# Patient Record
Sex: Male | Born: 1945
Health system: Southern US, Community
[De-identification: ages and names within clinical notes are randomized; demographics above are authoritative.]

## PROBLEM LIST (undated history)

## (undated) DIAGNOSIS — E785 Hyperlipidemia, unspecified: Secondary | ICD-10-CM

## (undated) DIAGNOSIS — M545 Low back pain, unspecified: Secondary | ICD-10-CM

## (undated) DIAGNOSIS — E119 Type 2 diabetes mellitus without complications: Secondary | ICD-10-CM

## (undated) DIAGNOSIS — G8929 Other chronic pain: Secondary | ICD-10-CM

## (undated) DIAGNOSIS — M5136 Other intervertebral disc degeneration, lumbar region: Secondary | ICD-10-CM

## (undated) DIAGNOSIS — Z8739 Personal history of other diseases of the musculoskeletal system and connective tissue: Secondary | ICD-10-CM

## (undated) DIAGNOSIS — I10 Essential (primary) hypertension: Secondary | ICD-10-CM

## (undated) DIAGNOSIS — M51369 Other intervertebral disc degeneration, lumbar region without mention of lumbar back pain or lower extremity pain: Secondary | ICD-10-CM

## (undated) DIAGNOSIS — C61 Malignant neoplasm of prostate: Secondary | ICD-10-CM

## (undated) DIAGNOSIS — M5126 Other intervertebral disc displacement, lumbar region: Secondary | ICD-10-CM

## (undated) HISTORY — DX: Hyperlipidemia, unspecified: E78.5

## (undated) HISTORY — PX: VASECTOMY: SHX75

## (undated) HISTORY — DX: Type 2 diabetes mellitus without complications: E11.9

## (undated) HISTORY — DX: Essential (primary) hypertension: I10

## (undated) HISTORY — PX: PROSTATE BIOPSY: SHX241

## (undated) HISTORY — DX: Malignant neoplasm of prostate: C61

## (undated) HISTORY — PX: COLONOSCOPY: SHX174

---

## 1998-12-01 ENCOUNTER — Emergency Department (HOSPITAL_COMMUNITY): Admission: EM | Admit: 1998-12-01 | Discharge: 1998-12-01 | Payer: Self-pay | Admitting: Emergency Medicine

## 1998-12-01 ENCOUNTER — Encounter: Payer: Self-pay | Admitting: Emergency Medicine

## 1998-12-05 ENCOUNTER — Ambulatory Visit (HOSPITAL_COMMUNITY): Admission: RE | Admit: 1998-12-05 | Discharge: 1998-12-05 | Payer: Self-pay | Admitting: Internal Medicine

## 1998-12-05 ENCOUNTER — Encounter: Payer: Self-pay | Admitting: Internal Medicine

## 2001-10-23 ENCOUNTER — Emergency Department (HOSPITAL_COMMUNITY): Admission: EM | Admit: 2001-10-23 | Discharge: 2001-10-23 | Payer: Self-pay | Admitting: Emergency Medicine

## 2003-05-30 ENCOUNTER — Emergency Department (HOSPITAL_COMMUNITY): Admission: EM | Admit: 2003-05-30 | Discharge: 2003-05-30 | Payer: Self-pay

## 2004-10-29 ENCOUNTER — Ambulatory Visit: Payer: Self-pay | Admitting: Endocrinology

## 2004-10-30 ENCOUNTER — Ambulatory Visit: Payer: Self-pay

## 2004-11-12 ENCOUNTER — Ambulatory Visit: Payer: Self-pay | Admitting: Internal Medicine

## 2005-01-19 ENCOUNTER — Ambulatory Visit: Payer: Self-pay | Admitting: Internal Medicine

## 2005-01-26 ENCOUNTER — Ambulatory Visit: Payer: Self-pay | Admitting: Internal Medicine

## 2005-02-09 ENCOUNTER — Encounter: Admission: RE | Admit: 2005-02-09 | Discharge: 2005-02-26 | Payer: Self-pay | Admitting: Internal Medicine

## 2005-03-02 ENCOUNTER — Ambulatory Visit: Payer: Self-pay | Admitting: Internal Medicine

## 2005-05-04 ENCOUNTER — Ambulatory Visit: Payer: Self-pay | Admitting: Internal Medicine

## 2005-06-01 ENCOUNTER — Encounter: Admission: RE | Admit: 2005-06-01 | Discharge: 2005-08-30 | Payer: Self-pay | Admitting: Internal Medicine

## 2005-10-02 ENCOUNTER — Emergency Department (HOSPITAL_COMMUNITY): Admission: EM | Admit: 2005-10-02 | Discharge: 2005-10-02 | Payer: Self-pay | Admitting: Emergency Medicine

## 2005-11-23 ENCOUNTER — Ambulatory Visit: Payer: Self-pay | Admitting: Internal Medicine

## 2006-02-17 ENCOUNTER — Ambulatory Visit: Payer: Self-pay | Admitting: Internal Medicine

## 2006-02-17 LAB — CONVERTED CEMR LAB
ALT: 25 units/L (ref 0–40)
AST: 23 units/L (ref 0–37)
Albumin: 3.8 g/dL (ref 3.5–5.2)
Alkaline Phosphatase: 65 units/L (ref 39–117)
BUN: 13 mg/dL (ref 6–23)
CO2: 28 meq/L (ref 19–32)
Calcium: 9.5 mg/dL (ref 8.4–10.5)
Chloride: 106 meq/L (ref 96–112)
Chol/HDL Ratio, serum: 5.3
Cholesterol: 190 mg/dL (ref 0–200)
Creatinine, Ser: 0.9 mg/dL (ref 0.4–1.5)
GFR calc non Af Amer: 92 mL/min
Glomerular Filtration Rate, Af Am: 111 mL/min/{1.73_m2}
Glucose, Bld: 116 mg/dL — ABNORMAL HIGH (ref 70–99)
HDL: 35.6 mg/dL — ABNORMAL LOW (ref >39.0)
Hgb A1c MFr Bld: 6.2 % — ABNORMAL HIGH (ref 4.6–6.0)
LDL Cholesterol: 132 mg/dL — ABNORMAL HIGH (ref 0–99)
Potassium: 4.6 meq/L (ref 3.5–5.1)
Sodium: 140 meq/L (ref 135–145)
Total Bilirubin: 0.7 mg/dL (ref 0.3–1.2)
Total Protein: 6.9 g/dL (ref 6.0–8.3)
Triglyceride fasting, serum: 113 mg/dL (ref 0–149)
VLDL: 23 mg/dL (ref 0–40)

## 2006-02-23 ENCOUNTER — Ambulatory Visit: Payer: Self-pay | Admitting: Internal Medicine

## 2006-06-07 ENCOUNTER — Ambulatory Visit: Payer: Self-pay | Admitting: Internal Medicine

## 2006-08-23 ENCOUNTER — Ambulatory Visit: Payer: Self-pay | Admitting: Internal Medicine

## 2006-08-23 LAB — CONVERTED CEMR LAB
ALT: 24 units/L (ref 0–40)
AST: 22 units/L (ref 0–37)
Albumin: 3.7 g/dL (ref 3.5–5.2)
Alkaline Phosphatase: 68 units/L (ref 39–117)
BUN: 17 mg/dL (ref 6–23)
Basophils Absolute: 0.1 10*3/uL (ref 0.0–0.1)
Basophils Relative: 0.9 % (ref 0.0–1.0)
Bilirubin, Direct: 0.1 mg/dL (ref 0.0–0.3)
CO2: 28 meq/L (ref 19–32)
Calcium: 8.9 mg/dL (ref 8.4–10.5)
Chloride: 109 meq/L (ref 96–112)
Cholesterol: 127 mg/dL (ref 0–200)
Creatinine, Ser: 0.9 mg/dL (ref 0.4–1.5)
Eosinophils Absolute: 0.1 10*3/uL (ref 0.0–0.6)
Eosinophils Relative: 1.3 % (ref 0.0–5.0)
GFR calc Af Amer: 111 mL/min
GFR calc non Af Amer: 92 mL/min
Glucose, Bld: 110 mg/dL — ABNORMAL HIGH (ref 70–99)
HCT: 43.1 % (ref 39.0–52.0)
HDL: 33.9 mg/dL — ABNORMAL LOW (ref >39.0)
Hemoglobin: 14.9 g/dL (ref 13.0–17.0)
Hgb A1c MFr Bld: 6.3 % — ABNORMAL HIGH (ref 4.6–6.0)
LDL Cholesterol: 80 mg/dL (ref 0–99)
Lymphocytes Relative: 25.8 % (ref 12.0–46.0)
MCHC: 34.7 g/dL (ref 30.0–36.0)
MCV: 91.6 fL (ref 78.0–100.0)
Monocytes Absolute: 0.6 10*3/uL (ref 0.2–0.7)
Monocytes Relative: 10.2 % (ref 3.0–11.0)
Neutro Abs: 3.4 10*3/uL (ref 1.4–7.7)
Neutrophils Relative %: 61.8 % (ref 43.0–77.0)
Platelets: 203 10*3/uL (ref 150–400)
Potassium: 4.5 meq/L (ref 3.5–5.1)
RBC: 4.7 M/uL (ref 4.22–5.81)
RDW: 12.5 % (ref 11.5–14.6)
Sodium: 142 meq/L (ref 135–145)
TSH: 1.35 microintl units/mL (ref 0.35–5.50)
Total Bilirubin: 0.6 mg/dL (ref 0.3–1.2)
Total CHOL/HDL Ratio: 3.7
Total Protein: 6.6 g/dL (ref 6.0–8.3)
Triglycerides: 67 mg/dL (ref 0–149)
VLDL: 13 mg/dL (ref 0–40)
WBC: 5.7 10*3/uL (ref 4.5–10.5)

## 2006-08-27 ENCOUNTER — Ambulatory Visit: Payer: Self-pay | Admitting: Internal Medicine

## 2006-12-16 ENCOUNTER — Ambulatory Visit: Payer: Self-pay | Admitting: Internal Medicine

## 2006-12-16 LAB — CONVERTED CEMR LAB
AST: 24 units/L (ref 0–37)
BUN: 18 mg/dL (ref 6–23)
CO2: 30 meq/L (ref 19–32)
Calcium: 9.1 mg/dL (ref 8.4–10.5)
Chloride: 112 meq/L (ref 96–112)
Cholesterol: 155 mg/dL (ref 0–200)
Creatinine, Ser: 0.9 mg/dL (ref 0.4–1.5)
GFR calc Af Amer: 111 mL/min
GFR calc non Af Amer: 92 mL/min
Glucose, Bld: 124 mg/dL — ABNORMAL HIGH (ref 70–99)
HDL: 37.2 mg/dL — ABNORMAL LOW (ref >39.0)
Hgb A1c MFr Bld: 6.4 % — ABNORMAL HIGH (ref 4.6–6.0)
LDL Cholesterol: 100 mg/dL — ABNORMAL HIGH (ref 0–99)
Potassium: 4.9 meq/L (ref 3.5–5.1)
Sodium: 148 meq/L — ABNORMAL HIGH (ref 135–145)
Total CHOL/HDL Ratio: 4.2
Triglycerides: 88 mg/dL (ref 0–149)
Uric Acid, Serum: 7.1 mg/dL — ABNORMAL HIGH (ref 2.4–7.0)
VLDL: 18 mg/dL (ref 0–40)
Vit D, 1,25-Dihydroxy: 17 — ABNORMAL LOW (ref 20–57)

## 2006-12-29 ENCOUNTER — Ambulatory Visit: Payer: Self-pay | Admitting: Internal Medicine

## 2007-04-25 ENCOUNTER — Ambulatory Visit: Payer: Self-pay | Admitting: Internal Medicine

## 2007-04-25 DIAGNOSIS — E119 Type 2 diabetes mellitus without complications: Secondary | ICD-10-CM

## 2007-04-25 DIAGNOSIS — E7800 Pure hypercholesterolemia, unspecified: Secondary | ICD-10-CM

## 2007-04-25 LAB — CONVERTED CEMR LAB
BUN: 14 mg/dL (ref 6–23)
CO2: 29 meq/L (ref 19–32)
Calcium: 9.4 mg/dL (ref 8.4–10.5)
Chloride: 107 meq/L (ref 96–112)
Creatinine, Ser: 0.9 mg/dL (ref 0.4–1.5)
GFR calc Af Amer: 111 mL/min
GFR calc non Af Amer: 92 mL/min
Glucose, Bld: 125 mg/dL — ABNORMAL HIGH (ref 70–99)
Hgb A1c MFr Bld: 6.5 % — ABNORMAL HIGH (ref 4.6–6.0)
Potassium: 4.6 meq/L (ref 3.5–5.1)
Sodium: 142 meq/L (ref 135–145)

## 2007-05-02 ENCOUNTER — Ambulatory Visit: Payer: Self-pay | Admitting: Internal Medicine

## 2007-05-02 DIAGNOSIS — M109 Gout, unspecified: Secondary | ICD-10-CM | POA: Insufficient documentation

## 2007-05-02 DIAGNOSIS — R209 Unspecified disturbances of skin sensation: Secondary | ICD-10-CM | POA: Insufficient documentation

## 2007-05-02 DIAGNOSIS — I10 Essential (primary) hypertension: Secondary | ICD-10-CM | POA: Insufficient documentation

## 2007-05-02 DIAGNOSIS — E785 Hyperlipidemia, unspecified: Secondary | ICD-10-CM

## 2007-08-29 ENCOUNTER — Encounter: Payer: Self-pay | Admitting: Internal Medicine

## 2007-09-01 ENCOUNTER — Encounter: Payer: Self-pay | Admitting: Internal Medicine

## 2007-09-05 ENCOUNTER — Ambulatory Visit: Payer: Self-pay | Admitting: Internal Medicine

## 2007-09-05 LAB — CONVERTED CEMR LAB
CO2: 29 meq/L (ref 19–32)
Calcium: 8.8 mg/dL (ref 8.4–10.5)
Creatinine, Ser: 0.9 mg/dL (ref 0.4–1.5)
GFR calc Af Amer: 111 mL/min
GFR calc non Af Amer: 92 mL/min
HDL: 32.7 mg/dL — ABNORMAL LOW (ref >39.0)
Hgb A1c MFr Bld: 6.7 % — ABNORMAL HIGH (ref 4.6–6.0)
LDL Cholesterol: 89 mg/dL (ref 0–99)
Sodium: 143 meq/L (ref 135–145)
Total CHOL/HDL Ratio: 4.3
Triglycerides: 103 mg/dL (ref 0–149)
VLDL: 21 mg/dL (ref 0–40)
Vitamin B-12: 338 pg/mL (ref 211–911)

## 2007-09-09 ENCOUNTER — Ambulatory Visit: Payer: Self-pay | Admitting: Internal Medicine

## 2007-09-09 DIAGNOSIS — M1990 Unspecified osteoarthritis, unspecified site: Secondary | ICD-10-CM | POA: Insufficient documentation

## 2007-09-09 DIAGNOSIS — M545 Low back pain: Secondary | ICD-10-CM | POA: Insufficient documentation

## 2007-09-19 ENCOUNTER — Encounter: Payer: Self-pay | Admitting: Internal Medicine

## 2007-11-30 ENCOUNTER — Encounter: Payer: Self-pay | Admitting: Internal Medicine

## 2007-12-19 ENCOUNTER — Ambulatory Visit: Payer: Self-pay | Admitting: Internal Medicine

## 2007-12-19 LAB — CONVERTED CEMR LAB
BUN: 17 mg/dL (ref 6–23)
Calcium: 8.8 mg/dL (ref 8.4–10.5)
Chloride: 110 meq/L (ref 96–112)
Creatinine, Ser: 0.9 mg/dL (ref 0.4–1.5)
GFR calc non Af Amer: 92 mL/min

## 2007-12-23 ENCOUNTER — Ambulatory Visit: Payer: Self-pay | Admitting: Internal Medicine

## 2008-04-23 ENCOUNTER — Ambulatory Visit: Payer: Self-pay | Admitting: Internal Medicine

## 2008-04-23 LAB — CONVERTED CEMR LAB
BUN: 15 mg/dL (ref 6–23)
Calcium: 9.3 mg/dL (ref 8.4–10.5)
Chloride: 108 meq/L (ref 96–112)
Creatinine, Ser: 0.9 mg/dL (ref 0.4–1.5)
GFR calc non Af Amer: 91 mL/min

## 2008-05-02 ENCOUNTER — Ambulatory Visit: Payer: Self-pay | Admitting: Internal Medicine

## 2008-05-02 DIAGNOSIS — R079 Chest pain, unspecified: Secondary | ICD-10-CM

## 2008-08-27 ENCOUNTER — Ambulatory Visit: Payer: Self-pay | Admitting: Internal Medicine

## 2008-08-27 LAB — CONVERTED CEMR LAB
ALT: 30 units/L (ref 0–53)
AST: 27 units/L (ref 0–37)
Albumin: 3.8 g/dL (ref 3.5–5.2)
Alkaline Phosphatase: 64 units/L (ref 39–117)
BUN: 19 mg/dL (ref 6–23)
Basophils Relative: 0.3 % (ref 0.0–3.0)
Bilirubin Urine: NEGATIVE
CO2: 29 meq/L (ref 19–32)
Calcium: 9.2 mg/dL (ref 8.4–10.5)
Chloride: 111 meq/L (ref 96–112)
Cholesterol: 157 mg/dL (ref 0–200)
Creatinine, Ser: 0.9 mg/dL (ref 0.4–1.5)
Eosinophils Relative: 2 % (ref 0.0–5.0)
Hemoglobin: 15.7 g/dL (ref 13.0–17.0)
Ketones, ur: NEGATIVE mg/dL
LDL Cholesterol: 103 mg/dL — ABNORMAL HIGH (ref 0–99)
Lymphocytes Relative: 24.7 % (ref 12.0–46.0)
Monocytes Relative: 13.1 % — ABNORMAL HIGH (ref 3.0–12.0)
Neutro Abs: 3.5 10*3/uL (ref 1.4–7.7)
Nitrite: NEGATIVE
RBC: 4.92 M/uL (ref 4.22–5.81)
Total CHOL/HDL Ratio: 4
Total Protein, Urine: NEGATIVE mg/dL
Total Protein: 6.7 g/dL (ref 6.0–8.3)
pH: 5.5 (ref 5.0–8.0)

## 2008-09-03 ENCOUNTER — Ambulatory Visit: Payer: Self-pay | Admitting: Internal Medicine

## 2008-09-24 ENCOUNTER — Telehealth: Payer: Self-pay | Admitting: Internal Medicine

## 2008-12-17 ENCOUNTER — Ambulatory Visit: Payer: Self-pay | Admitting: Internal Medicine

## 2008-12-17 LAB — CONVERTED CEMR LAB
CO2: 27 meq/L (ref 19–32)
Chloride: 110 meq/L (ref 96–112)
Creatinine, Ser: 0.9 mg/dL (ref 0.4–1.5)

## 2008-12-21 ENCOUNTER — Ambulatory Visit: Payer: Self-pay | Admitting: Internal Medicine

## 2009-04-29 ENCOUNTER — Ambulatory Visit: Payer: Self-pay | Admitting: Internal Medicine

## 2009-04-29 LAB — CONVERTED CEMR LAB
CO2: 27 meq/L (ref 19–32)
Calcium: 9.1 mg/dL (ref 8.4–10.5)
Glucose, Bld: 131 mg/dL — ABNORMAL HIGH (ref 70–99)
Sodium: 142 meq/L (ref 135–145)

## 2009-05-06 ENCOUNTER — Ambulatory Visit: Payer: Self-pay | Admitting: Internal Medicine

## 2009-08-27 ENCOUNTER — Ambulatory Visit: Payer: Self-pay | Admitting: Internal Medicine

## 2009-08-27 LAB — CONVERTED CEMR LAB
CO2: 28 meq/L (ref 19–32)
Calcium: 9.4 mg/dL (ref 8.4–10.5)
Chloride: 107 meq/L (ref 96–112)
Creatinine, Ser: 0.9 mg/dL (ref 0.4–1.5)
Glucose, Bld: 127 mg/dL — ABNORMAL HIGH (ref 70–99)
Hgb A1c MFr Bld: 6.9 % — ABNORMAL HIGH (ref 4.6–6.5)
Potassium: 4.6 meq/L (ref 3.5–5.1)

## 2009-09-03 ENCOUNTER — Ambulatory Visit: Payer: Self-pay | Admitting: Internal Medicine

## 2009-09-03 DIAGNOSIS — M79609 Pain in unspecified limb: Secondary | ICD-10-CM

## 2009-11-08 ENCOUNTER — Encounter: Payer: Self-pay | Admitting: Internal Medicine

## 2009-11-08 ENCOUNTER — Emergency Department (HOSPITAL_COMMUNITY): Admission: EM | Admit: 2009-11-08 | Discharge: 2009-11-08 | Payer: Self-pay | Admitting: Emergency Medicine

## 2009-11-12 ENCOUNTER — Encounter: Payer: Self-pay | Admitting: Internal Medicine

## 2009-12-12 ENCOUNTER — Ambulatory Visit: Payer: Self-pay | Admitting: Internal Medicine

## 2009-12-12 LAB — CONVERTED CEMR LAB
ALT: 26 units/L (ref 0–53)
Bilirubin Urine: NEGATIVE
Calcium: 9.2 mg/dL (ref 8.4–10.5)
Cholesterol: 157 mg/dL (ref 0–200)
Eosinophils Absolute: 0.1 10*3/uL (ref 0.0–0.7)
Eosinophils Relative: 2.3 % (ref 0.0–5.0)
GFR calc non Af Amer: 123.53 mL/min (ref >60)
HCT: 45.2 % (ref 39.0–52.0)
HDL: 41 mg/dL (ref >39.00)
Hemoglobin, Urine: NEGATIVE
Leukocytes, UA: NEGATIVE
Lymphs Abs: 1.3 10*3/uL (ref 0.7–4.0)
MCHC: 34.2 g/dL (ref 30.0–36.0)
MCV: 93.4 fL (ref 78.0–100.0)
Monocytes Absolute: 0.7 10*3/uL (ref 0.1–1.0)
Neutrophils Relative %: 62.9 % (ref 43.0–77.0)
Nitrite: NEGATIVE
PSA: 3.76 ng/mL (ref 0.10–4.00)
Platelets: 189 10*3/uL (ref 150.0–400.0)
Potassium: 4.8 meq/L (ref 3.5–5.1)
RDW: 13.2 % (ref 11.5–14.6)
Sodium: 144 meq/L (ref 135–145)
TSH: 1.26 microintl units/mL (ref 0.35–5.50)
Total Bilirubin: 0.7 mg/dL (ref 0.3–1.2)
Triglycerides: 118 mg/dL (ref 0.0–149.0)
VLDL: 23.6 mg/dL (ref 0.0–40.0)
Vitamin B-12: 278 pg/mL (ref 211–911)
WBC: 5.9 10*3/uL (ref 4.5–10.5)
pH: 6 (ref 5.0–8.0)

## 2009-12-17 ENCOUNTER — Encounter: Payer: Self-pay | Admitting: Internal Medicine

## 2009-12-20 ENCOUNTER — Encounter: Payer: Self-pay | Admitting: Internal Medicine

## 2009-12-20 ENCOUNTER — Ambulatory Visit: Payer: Self-pay | Admitting: Internal Medicine

## 2009-12-20 LAB — HM DIABETES FOOT EXAM

## 2010-04-18 ENCOUNTER — Ambulatory Visit
Admission: RE | Admit: 2010-04-18 | Discharge: 2010-04-18 | Payer: Self-pay | Source: Home / Self Care | Attending: Internal Medicine | Admitting: Internal Medicine

## 2010-04-18 LAB — CONVERTED CEMR LAB
BUN: 14 mg/dL (ref 6–23)
CO2: 29 meq/L (ref 19–32)
Chloride: 106 meq/L (ref 96–112)
Creatinine, Ser: 0.9 mg/dL (ref 0.4–1.5)
Glucose, Bld: 123 mg/dL — ABNORMAL HIGH (ref 70–99)

## 2010-04-28 ENCOUNTER — Ambulatory Visit
Admission: RE | Admit: 2010-04-28 | Discharge: 2010-04-28 | Payer: Self-pay | Source: Home / Self Care | Attending: Internal Medicine | Admitting: Internal Medicine

## 2010-05-20 NOTE — Letter (Signed)
Summary: [facility]  [facility]   Imported By: Lester [place] 12/26/2009 09:56:57  _____________________________________________________________________  External Attachment:    Type:   Image     Comment:   External Document

## 2010-05-20 NOTE — Letter (Signed)
Summary: Generic Letter  [facility]  [address] [place], [place] [address]   Phone: [phone]  Fax: [phone]    12/20/2009  RE: Jacob Chapman [address] [place], [place]  [address]     [name] needs to be on light duty x 2 weeks due to his arm condition           Sincerely,   Jacinta Shoe MD

## 2010-05-20 NOTE — Assessment & Plan Note (Signed)
Summary: 4 MOS F/U #/CD   Vital Signs:  Patient profile:   65 year old male Height:      66 inches Weight:      161.75 pounds BMI:     26.20 O2 Sat:      97 % on Room air Temp:     97.2 degrees F oral Pulse rate:   60 / minute BP sitting:   144 / 110  (left arm) Cuff size:   regular  Vitals Entered By: Lucious Groves (Sep 03, 2009 2:41 PM)  O2 Flow:  Room air CC: 4 mo f/u./kb Is Patient Diabetic? Yes Pain Assessment Patient in pain? no        CC:  4 mo f/u./kb.  History of Present Illness: The patient presents for a follow up of hypertension, diabetes, hyperlipidemia C/o pain in R foot x 1 wk ago - resolved  Current Medications (verified): 1)  Lovastatin 20 Mg  Tabs (Lovastatin) .Marland Kitchen.. 1 By Mouth Qd 2)  Amaryl 1 Mg  Tabs (Glimepiride) .... Take 1 Tablet By Mouth Once A Day 3)  Zestril 20 Mg  Tabs (Lisinopril) .... Take 1 Tablet By Mouth Once A Day 4)  Skelaxin 800 Mg Tabs (Metaxalone) .... 1/2 or 1 By Mouth Two Times A Day As Needed Back Pain 5)  Vitamin D3 1000 Unit  Tabs (Cholecalciferol) 6)  Aspir-Low 81 Mg Tbec (Aspirin) .Marland Kitchen.. 1 Once Daily After Meal 7)  Ibuprofen 800 Mg  Tabs (Ibuprofen) .Marland Kitchen.. 1 Three Times A Day  As Needed 8)  Zovirax 5 % Crea (Acyclovir) .... 5 Times A Day Prn  Allergies (verified): 1)  Metformin Hcl  Past History:  Past Medical History: Last updated: 09/09/2007 Diabetes mellitus, type II Gout Hyperlipidemia Hypertension Osteoarthritis Low back pain  Family History: Last updated: 05/02/2007 Family History Hypertension  Social History: Last updated: 09/09/2007 Occupation: Married Never Smoked Regular exercise-yes  Past Surgical History: Denies surgical history  Physical Exam  General:  Well-developed,well-nourished,in no acute distress; alert,appropriate and cooperative throughout examination Mouth:  Oral mucosa and oropharynx without lesions or exudates.  Teeth in good repair. Lungs:  Normal respiratory effort, chest expands  symmetrically. Lungs are clear to auscultation, no crackles or wheezes. Heart:  Normal rate and regular rhythm. S1 and S2 normal without gallop, murmur, click, rub or other extra sounds. Abdomen:  Bowel sounds positive,abdomen soft and non-tender without masses, organomegaly or hernias noted. Msk:  No deformity or scoliosis noted of thoracic or lumbar spine.  Lumbar-sacral spine is tender to palpation over paraspinal muscles and painfull with the ROM  Neurologic:  No cranial nerve deficits noted. Station and gait are normal. Plantar reflexes are down-going bilaterally. DTRs are symmetrical throughout. Sensory, motor and coordinative functions appear intact. Skin:  Intact without suspicious lesions or rashes Psych:  Cognition and judgment appear intact. Alert and cooperative with normal attention span and concentration. No apparent delusions, illusions, hallucinations  Diabetes Management Exam:    Foot Exam (with socks and/or shoes not present):       Sensory-Pinprick/Light touch:          Left medial foot (L-4): normal          Left dorsal foot (L-5): normal          Left lateral foot (S-1): normal          Right medial foot (L-4): normal          Right dorsal foot (L-5): normal  Right lateral foot (S-1): normal       Sensory-Monofilament:          Left foot: normal          Right foot: normal       Inspection:          Left foot: normal          Right foot: normal       Nails:          Left foot: normal          Right foot: normal   Impression & Recommendations:  Problem # 1:  DIABETES MELLITUS, TYPE II (ICD-250.00) Assessment Improved  His updated medication list for this problem includes:    Amaryl 1 Mg Tabs (Glimepiride) .Marland Kitchen... Take 1 tablet by mouth once a day    Zestril 20 Mg Tabs (Lisinopril) .Marland Kitchen... Take 1 tablet by mouth once a day    Aspir-low 81 Mg Tbec (Aspirin) .Marland Kitchen... 1 once daily after meal  Labs Reviewed: Creat: 0.9 (08/27/2009)    Reviewed HgBA1c results:  6.9 (08/27/2009)  7.1 (04/29/2009)  Problem # 2:  HYPERTENSION (ICD-401.9) Assessment: Unchanged  His updated medication list for this problem includes:    Zestril 20 Mg Tabs (Lisinopril) .Marland Kitchen... Take 1 tablet by mouth once a day  BP today: 144/110 Prior BP: 132/90 (05/06/2009)  Labs Reviewed: K+: 4.6 (08/27/2009) Creat: : 0.9 (08/27/2009)   Chol: 157 (08/27/2008)   HDL: 36.70 (08/27/2008)   LDL: 103 (08/27/2008)   TG: 88.0 (08/27/2008)  Problem # 3:  HYPERLIPIDEMIA (ICD-272.4) Assessment: Unchanged  His updated medication list for this problem includes:    Lovastatin 20 Mg Tabs (Lovastatin) .Marland Kitchen... 1 by mouth qd  Problem # 4:  GOUT (ICD-274.9) Assessment: Unchanged  Problem # 5:  FOOT PAIN (ICD-729.5) R dorsum - gout vs other Assessment: New Resolved w/Advil Info given  Complete Medication List: 1)  Lovastatin 20 Mg Tabs (Lovastatin) .Marland Kitchen.. 1 by mouth qd 2)  Amaryl 1 Mg Tabs (Glimepiride) .... Take 1 tablet by mouth once a day 3)  Zestril 20 Mg Tabs (Lisinopril) .... Take 1 tablet by mouth once a day 4)  Skelaxin 800 Mg Tabs (Metaxalone) .... 1/2 or 1 by mouth two times a day as needed back pain 5)  Vitamin D3 1000 Unit Tabs (Cholecalciferol) 6)  Aspir-low 81 Mg Tbec (Aspirin) .Marland Kitchen.. 1 once daily after meal 7)  Ibuprofen 800 Mg Tabs (Ibuprofen) .Marland Kitchen.. 1 three times a day  as needed 8)  Zovirax 5 % Crea (Acyclovir) .... 5 times a day prn  Patient Instructions: 1)  Please schedule a follow-up appointment in 4 months well w/lab and A1c and B12 and uric acid  v70.00 250.00. Prescriptions: LOVASTATIN 20 MG  TABS (LOVASTATIN) 1 by mouth qd  #90 x 3   Entered and Authorized by:   Tresa Garter MD   Signed by:   Tresa Garter MD on 09/03/2009   Method used:   Faxed to ...       [facility] (mail-order)       [address] [place], [place]  [address]       Ph: [phone]       Fax: [phone]   RxID:   [id number] AMARYL 1 MG  TABS (GLIMEPIRIDE) Take 1 tablet  by mouth once a day  #90 x 3   Entered and Authorized by:   Tresa Garter MD   Signed  by:   Tresa Garter MD on 09/03/2009   Method used:   Faxed to ...       [facility] (mail-order)       [address] [place], [place]  [address]       Ph: [phone]       Fax: [phone]   RxID:   [id number] ZESTRIL 20 MG  TABS (LISINOPRIL) Take 1 tablet by mouth once a day  #90 x 3   Entered and Authorized by:   Tresa Garter MD   Signed by:   Tresa Garter MD on 09/03/2009   Method used:   Faxed to ...       [facility] (mail[facility]order)       [address] [place], [place]  [address]       Ph: [phone]       Fax: [phone]   RxID:   [id number]

## 2010-05-20 NOTE — Assessment & Plan Note (Signed)
Summary: 4 mos f/u $50 / cd   Vital Signs:  Patient profile:   65 year old male Weight:      163 pounds Temp:     97.3 degrees F oral Pulse rate:   67 / minute BP sitting:   132 / 90  (left arm)  Vitals Entered By: Tora Perches (May 06, 2009 3:05 PM) CC: f/u Is Patient Diabetic? Yes   CC:  f/u.  History of Present Illness: The patient presents for a follow up of hypertension, diabetes, hyperlipidemia   Preventive Screening-Counseling & Management  Alcohol-Tobacco     Smoking Status: never  Current Medications (verified): 1)  Lovastatin 20 Mg  Tabs (Lovastatin) .Marland Kitchen.. 1 By Mouth Qd 2)  Amaryl 1 Mg  Tabs (Glimepiride) .... Take 1 Tablet By Mouth Once A Day 3)  Zestril 20 Mg  Tabs (Lisinopril) .... Take 1 Tablet By Mouth Once A Day 4)  Skelaxin 800 Mg Tabs (Metaxalone) .... 1/2 or 1 By Mouth Two Times A Day As Needed Back Pain 5)  Vitamin D3 1000 Unit  Tabs (Cholecalciferol) 6)  Aspir-Low 81 Mg Tbec (Aspirin) .Marland Kitchen.. 1 Once Daily After Meal 7)  Ibuprofen 800 Mg  Tabs (Ibuprofen) .Marland Kitchen.. 1 Three Times A Day  As Needed 8)  Zovirax 5 % Crea (Acyclovir) .... 5 Times A Day Prn  Allergies: 1)  Metformin Hcl  Past History:  Past Medical History: Last updated: 09/09/2007 Diabetes mellitus, type II Gout Hyperlipidemia Hypertension Osteoarthritis Low back pain  Social History: Last updated: 09/09/2007 Occupation: Married Never Smoked Regular exercise-yes  Review of Systems  The patient denies fever, dyspnea on exertion, and abdominal pain.    Physical Exam  General:  Well-developed,well-nourished,in no acute distress; alert,appropriate and cooperative throughout examination Mouth:  Oral mucosa and oropharynx without lesions or exudates.  Teeth in good repair. Lungs:  Normal respiratory effort, chest expands symmetrically. Lungs are clear to auscultation, no crackles or wheezes. Heart:  Normal rate and regular rhythm. S1 and S2 normal without gallop, murmur, click,  rub or other extra sounds. Abdomen:  Bowel sounds positive,abdomen soft and non-tender without masses, organomegaly or hernias noted. Msk:  No deformity or scoliosis noted of thoracic or lumbar spine.  Lumbar-sacral spine is tender to palpation over paraspinal muscles and painfull with the ROM  Neurologic:  No cranial nerve deficits noted. Station and gait are normal. Plantar reflexes are down-going bilaterally. DTRs are symmetrical throughout. Sensory, motor and coordinative functions appear intact. Skin:  Intact without suspicious lesions or rashes Psych:  Cognition and judgment appear intact. Alert and cooperative with normal attention span and concentration. No apparent delusions, illusions, hallucinations   Impression & Recommendations:  Problem # 1:  DIABETES MELLITUS, TYPE II (ICD-250.00) Assessment Deteriorated  His updated medication list for this problem includes:    Amaryl 1 Mg Tabs (Glimepiride) .Marland Kitchen... Take 1 tablet by mouth once a day    Zestril 20 Mg Tabs (Lisinopril) .Marland Kitchen... Take 1 tablet by mouth once a day    Aspir-low 81 Mg Tbec (Aspirin) .Marland Kitchen... 1 once daily after meal  Problem # 2:  HYPERTENSION (ICD-401.9) Assessment: Unchanged  His updated medication list for this problem includes:    Zestril 20 Mg Tabs (Lisinopril) .Marland Kitchen... Take 1 tablet by mouth once a day  Problem # 3:  LOW BACK PAIN (ICD-724.2) Assessment: Improved  His updated medication list for this problem includes:    Skelaxin 800 Mg Tabs (Metaxalone) .Marland Kitchen... 1/2 or 1 by mouth two times a  day as needed back pain    Aspir-low 81 Mg Tbec (Aspirin) .Marland Kitchen... 1 once daily after meal    Ibuprofen 800 Mg Tabs (Ibuprofen) .Marland Kitchen... 1 three times a day  as needed  Problem # 4:  HYPERLIPIDEMIA (ICD-272.4) Assessment: Comment Only  His updated medication list for this problem includes:    Lovastatin 20 Mg Tabs (Lovastatin) .Marland Kitchen... 1 by mouth qd  Complete Medication List: 1)  Lovastatin 20 Mg Tabs (Lovastatin) .Marland Kitchen.. 1 by mouth  qd 2)  Amaryl 1 Mg Tabs (Glimepiride) .... Take 1 tablet by mouth once a day 3)  Zestril 20 Mg Tabs (Lisinopril) .... Take 1 tablet by mouth once a day 4)  Skelaxin 800 Mg Tabs (Metaxalone) .... 1/2 or 1 by mouth two times a day as needed back pain 5)  Vitamin D3 1000 Unit Tabs (Cholecalciferol) 6)  Aspir-low 81 Mg Tbec (Aspirin) .Marland Kitchen.. 1 once daily after meal 7)  Ibuprofen 800 Mg Tabs (Ibuprofen) .Marland Kitchen.. 1 three times a day  as needed 8)  Zovirax 5 % Crea (Acyclovir) .... 5 times a day prn  Other Orders: Zoster (Shingles) Vaccine Live [id number]) Admin 1st Vaccine (59563) Admin 1st Vaccine [facility]) [id number])  Patient Instructions: 1)  Please schedule a follow-up appointment in 4 months. 2)  BMP prior to visit, ICD-9: 3)  HbgA1C prior to visit, ICD-9:250.00   Zostavax # 1    Vaccine Type: Zostavax    Site: right deltoid    Mfr: Merck    Dose: 0.5 ml    Route: [place]    Given by: Tora Perches    Exp. Date: 04/17/2011    Lot #: [id number]    VIS given: 01/30/05 given May 06, 2009.

## 2010-05-20 NOTE — Assessment & Plan Note (Signed)
Summary: CPX /NWS  #   Vital Signs:  Patient profile:   65 year old male Height:      66 inches Weight:      162 pounds BMI:     26.24 O2 Sat:      96 % Temp:     98.6 degrees F oral Pulse rate:   72 / minute Pulse rhythm:   regular Resp:     16 per minute BP sitting:   108 / 80  (left arm)  Vitals Entered By: Lanier Prude, CMA(AAMA) (December 24, 2009 8:06 AM) CC: CPX Is Patient Diabetic? Yes   CC:  CPX.  History of Present Illness: The patient presents for a preventive health examination  C/o L elbow pain x 4 mo  Current Medications (verified): 1)  Lovastatin 20 Mg  Tabs (Lovastatin) .Marland Kitchen.. 1 By Mouth Qd 2)  Amaryl 1 Mg  Tabs (Glimepiride) .... Take 1 Tablet By Mouth Once A Day 3)  Zestril 20 Mg  Tabs (Lisinopril) .... Take 1 Tablet By Mouth Once A Day 4)  Skelaxin 800 Mg Tabs (Metaxalone) .... 1/2 or 1 By Mouth Two Times A Day As Needed Back Pain 5)  Vitamin D3 1000 Unit  Tabs (Cholecalciferol) 6)  Aspir-Low 81 Mg Tbec (Aspirin) .Marland Kitchen.. 1 Once Daily After Meal 7)  Ibuprofen 800 Mg  Tabs (Ibuprofen) .Marland Kitchen.. 1 Three Times A Day  As Needed 8)  Zovirax 5 % Crea (Acyclovir) .... 5 Times A Day Prn 9)  Vitamin B-12 500 Mcg Tabs (Cyanocobalamin) .Marland Kitchen.. 1 By Mouth Once Daily For Vitamin B12 Deficiency 10)  Pennsaid 1.5 % Soln (Diclofenac Sodium) .... 3-5 Gtt On Skin Three Times A Day For Pain  Allergies (verified): 1)  Metformin Hcl  Past History:  Past Surgical History: Last updated: 09/03/2009 Denies surgical history  Family History: Last updated: 05/02/2007 Family History Hypertension  Social History: Last updated: 09/09/2007 Occupation: Married Never Smoked Regular exercise-yes  Past Medical History: Diabetes mellitus, type II Gout Hyperlipidemia Hypertension Osteoarthritis Low back pain Kidney stone 2011 Dr Vernie Ammons  Review of Systems  The patient denies anorexia, fever, weight loss, weight gain, vision loss, decreased hearing, hoarseness, chest pain,  syncope, dyspnea on exertion, peripheral edema, prolonged cough, headaches, hemoptysis, abdominal pain, melena, hematochezia, severe indigestion/heartburn, hematuria, incontinence, genital sores, muscle weakness, suspicious skin lesions, transient blindness, difficulty walking, depression, unusual weight change, abnormal bleeding, enlarged lymph nodes, angioedema, and testicular masses.    Physical Exam  General:  Well-developed,well-nourished,in no acute distress; alert,appropriate and cooperative throughout examination Head:  Normocephalic and atraumatic without obvious abnormalities. No apparent alopecia or balding. Eyes:  No corneal or conjunctival inflammation noted. EOMI. Perrla. Ears:  External ear exam shows no significant lesions or deformities.  Otoscopic examination reveals clear canals, tympanic membranes are intact bilaterally without bulging, retraction, inflammation or discharge. Hearing is grossly normal bilaterally. Nose:  External nasal examination shows no deformity or inflammation. Nasal mucosa are pink and moist without lesions or exudates. Mouth:  Oral mucosa and oropharynx without lesions or exudates.  Teeth in good repair. Neck:  No deformities, masses, or tenderness noted. Lungs:  Normal respiratory effort, chest expands symmetrically. Lungs are clear to auscultation, no crackles or wheezes. Heart:  Normal rate and regular rhythm. S1 and S2 normal without gallop, murmur, click, rub or other extra sounds. Abdomen:  Bowel sounds positive,abdomen soft and non-tender without masses, organomegaly or hernias noted. Rectal:  No external abnormalities noted. Normal sphincter tone. No rectal masses or  G(-) Genitalia:  Testes bilaterally descended without nodularity, tenderness or masses. No scrotal masses or lesions. No penis lesions or urethral discharge. Prostate:  Prostate gland firm and smooth, no enlargement, nodularity, tenderness, mass, asymmetry or induration. Msk:  No  deformity or scoliosis noted of thoracic or lumbar spine.  Lumbar-sacral spine is tender to palpation over paraspinal muscles and painfull with the ROM  L anterior prox forearm is tender to palpation. Extremities:  No clubbing, cyanosis, edema, or deformity noted with normal full range of motion of all joints.   Neurologic:  No cranial nerve deficits noted. Station and gait are normal. Plantar reflexes are down-going bilaterally. DTRs are symmetrical throughout. Sensory, motor and coordinative functions appear intact. Skin:  Intact without suspicious lesions or rashes Cervical Nodes:  No lymphadenopathy noted Inguinal Nodes:  No significant adenopathy Psych:  Cognition and judgment appear intact. Alert and cooperative with normal attention span and concentration. No apparent delusions, illusions, hallucinations  Diabetes Management Exam:    Foot Exam (with socks and/or shoes not present):       Sensory-Pinprick/Light touch:          Left medial foot (L-4): normal          Left dorsal foot (L-5): normal          Left lateral foot (S-1): normal          Right medial foot (L-4): normal          Right dorsal foot (L-5): normal          Right lateral foot (S-1): normal       Sensory-Monofilament:          Left foot: normal          Right foot: normal       Inspection:          Left foot: normal          Right foot: normal       Nails:          Left foot: normal          Right foot: normal   Impression & Recommendations:  Problem # 1:  WELL ADULT EXAM (ICD-V70.0) Assessment New  EKG OK The labs were reviewed with the patient. Health and age related issues were discussed. Available screening tests and vaccinations were discussed as well. Healthy life style including good diet and exercise was discussed.    Orders: EKG w/ Interpretation (93000)  Problem # 2:  DIABETES MELLITUS, TYPE II (ICD-250.00) Assessment: Unchanged  His updated medication list for this problem includes:     Amaryl 1 Mg Tabs (Glimepiride) .Marland Kitchen... Take 1 tablet by mouth once a day    Zestril 20 Mg Tabs (Lisinopril) .Marland Kitchen... Take 1 tablet by mouth once a day    Aspir-low 81 Mg Tbec (Aspirin) .Marland Kitchen... 1 once daily after meal  Problem # 3:  ARM PAIN (ICD-729.5) L  forearm MSK strain Assessment: New Pennsaid Inj if not better  Complete Medication List: 1)  Lovastatin 20 Mg Tabs (Lovastatin) .Marland Kitchen.. 1 by mouth qd 2)  Amaryl 1 Mg Tabs (Glimepiride) .... Take 1 tablet by mouth once a day 3)  Zestril 20 Mg Tabs (Lisinopril) .... Take 1 tablet by mouth once a day 4)  Skelaxin 800 Mg Tabs (Metaxalone) .... 1/2 or 1 by mouth two times a day as needed back pain 5)  Vitamin D3 1000 Unit Tabs (Cholecalciferol) 6)  Aspir-low 81 Mg Tbec (Aspirin) .Marland Kitchen.. 1 once  daily after meal 7)  Ibuprofen 800 Mg Tabs (Ibuprofen) .Marland Kitchen.. 1 three times a day  as needed 8)  Zovirax 5 % Crea (Acyclovir) .... 5 times a day prn 9)  Vitamin B-12 500 Mcg Tabs (Cyanocobalamin) .Marland Kitchen.. 1 by mouth once daily for vitamin b12 deficiency 10)  Pennsaid 1.5 % Soln (Diclofenac sodium) .... 3-5 gtt on skin three times a day for pain  Patient Instructions: 1)  Please schedule a follow-up appointment in 4 months. 2)  BMP prior to visit, ICD-9: 3)  HbgA1C prior to visit, ICD-9:250.00 Prescriptions: PENNSAID 1.5 % SOLN (DICLOFENAC SODIUM) 3-5 gtt on skin three times a day for pain  #1 x 3   Entered and Authorized by:   Tresa Garter MD   Signed by:   Tresa Garter MD on 12/20/2009   Method used:   Print then Give to Patient   RxID:   [id number] VITAMIN B-12 500 MCG TABS (CYANOCOBALAMIN) 1 by mouth once daily for Vitamin B12 deficiency  #30 x 12   Entered and Authorized by:   Tresa Garter MD   Signed by:   Tresa Garter MD on 12/20/2009   Method used:   Print then Give to Patient   RxID:   [id number]

## 2010-05-20 NOTE — Letter (Signed)
Summary: [facility]  [facility]   Imported By: Lennie Odor 11/26/2009 11:24:06  _____________________________________________________________________  External Attachment:    Type:   Image     Comment:   External Document

## 2010-05-22 NOTE — Assessment & Plan Note (Signed)
Summary: 4 MO RV /NWS  #   Vital Signs:  Patient profile:   66 year old male Height:      66 inches Weight:      163 pounds BMI:     26.40 Temp:     98.5 degrees F oral Pulse rate:   76 / minute Pulse rhythm:   regular Resp:     16 per minute BP sitting:   146 / 94  (left arm) Cuff size:   regular  Vitals Entered By: Lanier Prude, CMA(AAMA) (April 28, 2010 3:03 PM) CC: 4 mo f/u  Is Patient Diabetic? Yes Comments pt has FMLA forms to be completed   CC:  4 mo f/u .  History of Present Illness: The patient presents for a follow up of hypertension, diabetes, hyperlipidemia    Current Medications (verified): 1)  Lovastatin 20 Mg  Tabs (Lovastatin) .Marland Kitchen.. 1 By Mouth Qd 2)  Amaryl 1 Mg  Tabs (Glimepiride) .... Take 1 Tablet By Mouth Once A Day 3)  Zestril 20 Mg  Tabs (Lisinopril) .... Take 1 Tablet By Mouth Once A Day 4)  Skelaxin 800 Mg Tabs (Metaxalone) .... 1/2 or 1 By Mouth Two Times A Day As Needed Back Pain 5)  Vitamin D3 1000 Unit  Tabs (Cholecalciferol) 6)  Aspir-Low 81 Mg Tbec (Aspirin) .Marland Kitchen.. 1 Once Daily After Meal 7)  Ibuprofen 800 Mg  Tabs (Ibuprofen) .Marland Kitchen.. 1 Three Times A Day  As Needed 8)  Zovirax 5 % Crea (Acyclovir) .... 5 Times A Day Prn 9)  Vitamin B-12 500 Mcg Tabs (Cyanocobalamin) .Marland Kitchen.. 1 By Mouth Once Daily For Vitamin B12 Deficiency 10)  Pennsaid 1.5 % Soln (Diclofenac Sodium) .... 3-5 Gtt On Skin Three Times A Day For Pain  Allergies (verified): 1)  Metformin Hcl  Physical Exam  General:  Well-developed,well-nourished,in no acute distress; alert,appropriate and cooperative throughout examination Eyes:  No corneal or conjunctival inflammation noted. EOMI. Perrla. Nose:  External nasal examination shows no deformity or inflammation. Nasal mucosa are pink and moist without lesions or exudates. Mouth:  Oral mucosa and oropharynx without lesions or exudates.  Teeth in good repair. Neck:  No deformities, masses, or tenderness noted. Lungs:  Normal  respiratory effort, chest expands symmetrically. Lungs are clear to auscultation, no crackles or wheezes. Heart:  Normal rate and regular rhythm. S1 and S2 normal without gallop, murmur, click, rub or other extra sounds. Abdomen:  Bowel sounds positive,abdomen soft and non-tender without masses, organomegaly or hernias noted. Msk:  No deformity or scoliosis noted of thoracic or lumbar spine.  Lumbar-sacral spine is tender to palpation over paraspinal muscles and painfull with the ROM  L anterior prox forearm is tender to palpation. Extremities:  No clubbing, cyanosis, edema, or deformity noted with normal full range of motion of all joints.   Neurologic:  No cranial nerve deficits noted. Station and gait are normal. Plantar reflexes are down-going bilaterally. DTRs are symmetrical throughout. Sensory, motor and coordinative functions appear intact. Skin:  Intact without suspicious lesions or rashes Psych:  Cognition and judgment appear intact. Alert and cooperative with normal attention span and concentration. No apparent delusions, illusions, hallucinations   Impression & Recommendations:  Problem # 1:  DIABETES MELLITUS, TYPE II (ICD-250.00) Assessment Deteriorated  His updated medication list for this problem includes:    Amaryl 1 Mg Tabs (Glimepiride) .Marland Kitchen... Take 1 tablet by mouth in am and 1/2 tab at dinner    Zestril 20 Mg Tabs (Lisinopril) .Marland Kitchen... Take  1 tablet by mouth once a day    Aspir-low 81 Mg Tbec (Aspirin) .Marland Kitchen... 1 once daily after meal  Problem # 2:  HYPERTENSION (ICD-401.9) Assessment: Deteriorated  His updated medication list for this problem includes:    Zestril 20 Mg Tabs (Lisinopril) .Marland Kitchen... Take 1 tablet by mouth once a day  Problem # 3:  HYPERCHOLESTEROLEMIA (ICD-272.0) Assessment: Unchanged  His updated medication list for this problem includes:    Lovastatin 20 Mg Tabs (Lovastatin) .Marland Kitchen... 1 by mouth qd  Problem # 4:  GOUT (ICD-274.9) Assessment:  Improved  Complete Medication List: 1)  Lovastatin 20 Mg Tabs (Lovastatin) .Marland Kitchen.. 1 by mouth qd 2)  Amaryl 1 Mg Tabs (Glimepiride) .... Take 1 tablet by mouth in am and 1/2 tab at dinner 3)  Zestril 20 Mg Tabs (Lisinopril) .... Take 1 tablet by mouth once a day 4)  Skelaxin 800 Mg Tabs (Metaxalone) .... 1/2 or 1 by mouth two times a day as needed back pain 5)  Vitamin D3 1000 Unit Tabs (Cholecalciferol) 6)  Aspir-low 81 Mg Tbec (Aspirin) .Marland Kitchen.. 1 once daily after meal 7)  Ibuprofen 800 Mg Tabs (Ibuprofen) .Marland Kitchen.. 1 three times a day  as needed 8)  Zovirax 5 % Crea (Acyclovir) .... 5 times a day prn 9)  Vitamin B-12 500 Mcg Tabs (Cyanocobalamin) .Marland Kitchen.. 1 by mouth once daily for vitamin b12 deficiency 10)  Pennsaid 1.5 % Soln (Diclofenac sodium) .... 3-5 gtt on skin three times a day for pain  Patient Instructions: 1)  Please schedule a follow-up appointment in 3 months. 2)  BMP prior to visit, ICD-9: 3)  HbgA1C prior to visit, ICD-9:250.00  995.20 Prescriptions: AMARYL 1 MG  TABS (GLIMEPIRIDE) Take 1 tablet by mouth in am and 1/2 tab at dinner  #135 x 11   Entered and Authorized by:   Tresa Garter MD   Signed by:   Tresa Garter MD on 04/28/2010   Method used:   Faxed to ...       [facility] (mail-order)       [address] [place], [place]  [address]       Ph: [phone]       Fax: [phone]   RxID:   [id number] AMARYL 1 MG  TABS (GLIMEPIRIDE) Take 1 tablet by mouth in am and 1/2 tab at dinner  #45 x 11   Entered and Authorized by:   Tresa Garter MD   Signed by:   Tresa Garter MD on 04/28/2010   Method used:   Print then Give to Patient   RxID:   [id number]    Orders Added: 1)  Est. Patient Level IV [74259]

## 2010-06-09 ENCOUNTER — Encounter: Payer: Self-pay | Admitting: Internal Medicine

## 2010-06-26 NOTE — Letter (Signed)
Summary: [facility]   Imported By: Lennie Odor 06/20/2010 15:45:27  _____________________________________________________________________  External Attachment:    Type:   Image     Comment:   External Document

## 2010-07-05 LAB — URINALYSIS, ROUTINE W REFLEX MICROSCOPIC
Glucose, UA: 500 mg/dL — AB
Ketones, ur: 15 mg/dL — AB
Leukocytes, UA: NEGATIVE
pH: 5.5 (ref 5.0–8.0)

## 2010-07-05 LAB — URINE MICROSCOPIC-ADD ON

## 2010-07-05 LAB — POCT I-STAT, CHEM 8
BUN: 17 mg/dL (ref 6–23)
Calcium, Ion: 1.09 mmol/L — ABNORMAL LOW (ref 1.12–1.32)
Chloride: 107 mEq/L (ref 96–112)
Creatinine, Ser: 0.8 mg/dL (ref 0.4–1.5)
Glucose, Bld: 189 mg/dL — ABNORMAL HIGH (ref 70–99)

## 2010-08-22 ENCOUNTER — Other Ambulatory Visit: Payer: Self-pay | Admitting: *Deleted

## 2010-08-22 MED ORDER — LISINOPRIL 20 MG PO TABS: 20.0000 mg | ORAL_TABLET | Freq: Every day | ORAL | Status: DC

## 2010-08-22 MED ORDER — LOVASTATIN 20 MG PO TABS: 20.0000 mg | ORAL_TABLET | Freq: Every day | ORAL | Status: DC

## 2010-09-05 ENCOUNTER — Other Ambulatory Visit: Payer: Self-pay | Admitting: Internal Medicine

## 2010-09-05 DIAGNOSIS — T887XXA Unspecified adverse effect of drug or medicament, initial encounter: Secondary | ICD-10-CM

## 2010-09-05 DIAGNOSIS — E119 Type 2 diabetes mellitus without complications: Secondary | ICD-10-CM

## 2010-09-08 ENCOUNTER — Other Ambulatory Visit (INDEPENDENT_AMBULATORY_CARE_PROVIDER_SITE_OTHER): Payer: BC Managed Care – PPO

## 2010-09-08 DIAGNOSIS — T887XXA Unspecified adverse effect of drug or medicament, initial encounter: Secondary | ICD-10-CM

## 2010-09-08 DIAGNOSIS — Z79899 Other long term (current) drug therapy: Secondary | ICD-10-CM

## 2010-09-08 DIAGNOSIS — E119 Type 2 diabetes mellitus without complications: Secondary | ICD-10-CM

## 2010-09-08 LAB — BASIC METABOLIC PANEL
Calcium: 9.1 mg/dL (ref 8.4–10.5)
Chloride: 106 mEq/L (ref 96–112)
Creatinine, Ser: 0.9 mg/dL (ref 0.4–1.5)

## 2010-09-08 LAB — HEMOGLOBIN A1C: Hgb A1c MFr Bld: 7.3 % — ABNORMAL HIGH (ref 4.6–6.5)

## 2010-09-12 ENCOUNTER — Ambulatory Visit (INDEPENDENT_AMBULATORY_CARE_PROVIDER_SITE_OTHER): Payer: BC Managed Care – PPO | Admitting: Internal Medicine

## 2010-09-12 ENCOUNTER — Encounter: Payer: Self-pay | Admitting: Internal Medicine

## 2010-09-12 DIAGNOSIS — D1739 Benign lipomatous neoplasm of skin and subcutaneous tissue of other sites: Secondary | ICD-10-CM

## 2010-09-12 DIAGNOSIS — D170 Benign lipomatous neoplasm of skin and subcutaneous tissue of head, face and neck: Secondary | ICD-10-CM

## 2010-09-12 DIAGNOSIS — I10 Essential (primary) hypertension: Secondary | ICD-10-CM

## 2010-09-12 DIAGNOSIS — M109 Gout, unspecified: Secondary | ICD-10-CM

## 2010-09-12 DIAGNOSIS — E119 Type 2 diabetes mellitus without complications: Secondary | ICD-10-CM

## 2010-09-12 NOTE — Assessment & Plan Note (Signed)
Better. Improve activity

## 2010-09-12 NOTE — Assessment & Plan Note (Signed)
On Rx 

## 2010-09-12 NOTE — Progress Notes (Signed)
  Subjective:    Patient ID: Jacob Chapman, male    DOB: Jun 24, 1945, 65 y.o.   MRN: [id number]  HPI  The patient presents for a follow-up of  chronic hypertension, chronic dyslipidemia, type 2 diabetes controlled with medicines  Wt Readings from Last 3 Encounters:  09/12/10 156 lb (70.761 kg)  04/28/10 163 lb (73.936 kg)  12/20/09 162 lb (73.483 kg)    Review of Systems  Constitutional: Negative for diaphoresis, activity change and unexpected weight change.  HENT: Negative for nosebleeds, drooling and mouth sores.   Eyes: Negative for itching.  Respiratory: Negative for apnea.   Cardiovascular: Negative for chest pain.  Gastrointestinal: Negative for constipation and anal bleeding.  Genitourinary: Negative for flank pain and penile swelling.  Musculoskeletal: Negative for back pain and joint swelling.  Skin: Negative for pallor and rash.  Psychiatric/Behavioral: The patient is not hyperactive.        Objective:   Physical Exam  Constitutional: He is oriented to person, place, and time. He appears well-developed.  HENT:  Mouth/Throat: Oropharynx is clear and moist.  Eyes: Conjunctivae are normal. Pupils are equal, round, and reactive to light.  Neck: Normal range of motion. No JVD present. No thyromegaly present.       L neck lipoma 2 cm  Cardiovascular: Normal rate, regular rhythm, normal heart sounds and intact distal pulses.  Exam reveals no gallop and no friction rub.   No murmur heard. Pulmonary/Chest: Effort normal and breath sounds normal. No respiratory distress. He has no wheezes. He has no rales. He exhibits no tenderness.  Abdominal: Soft. Bowel sounds are normal. He exhibits no distension and no mass. There is no tenderness. There is no rebound and no guarding.  Musculoskeletal: Normal range of motion. He exhibits no edema and no tenderness.  Lymphadenopathy:    He has no cervical adenopathy.  Neurological: He is alert and oriented to person, place, and time. He  has normal reflexes. No cranial nerve deficit. He exhibits normal muscle tone. Coordination normal.  Skin: Skin is warm and dry. No rash noted.  Psychiatric: He has a normal mood and affect. His behavior is normal. Judgment and thought content normal.   L neck lipoma      Lab Results  Component Value Date   WBC 5.9 12/12/2009   HGB 15.5 12/12/2009   HCT 45.2 12/12/2009   PLT 189.0 12/12/2009   CHOL 157 12/12/2009   TRIG 118.0 12/12/2009   HDL 41.00 12/12/2009   ALT 26 12/12/2009   AST 25 12/12/2009   NA 141 09/08/2010   K 5.0 09/08/2010   CL 106 09/08/2010   CREATININE 0.9 09/08/2010   BUN 13 09/08/2010   CO2 28 09/08/2010   TSH 1.26 12/12/2009   PSA 3.76 12/12/2009   HGBA1C 7.3* 09/08/2010    Assessment & Plan:  DIABETES MELLITUS, TYPE II Better. Improve activity He lost wt - should help DM  HYPERTENSION On Rx  GOUT On rx L neck lipoma --   chronic

## 2010-09-12 NOTE — Assessment & Plan Note (Signed)
On rx 

## 2010-09-12 NOTE — Assessment & Plan Note (Signed)
S/p derm eval -- no change

## 2010-09-15 ENCOUNTER — Encounter: Payer: Self-pay | Admitting: Internal Medicine

## 2010-10-29 ENCOUNTER — Telehealth: Payer: Self-pay | Admitting: *Deleted

## 2010-10-29 NOTE — Telephone Encounter (Signed)
Pt's wife called about getting an authorization and/or new Rx to pharmacy [[facility]] for patient's Skelaxin [Metaxalone] for back pain which is expired [last refill showing 05/02/2007].  Caller states that this was discussed at last OV [on 09/12/10]. Please advise.

## 2010-10-30 MED ORDER — METAXALONE 800 MG PO TABS: 400.0000 mg | ORAL_TABLET | Freq: Two times a day (BID) | ORAL | Status: DC | PRN

## 2010-10-30 NOTE — Telephone Encounter (Signed)
Rx sent. Pt's wife informed.  

## 2010-10-30 NOTE — Telephone Encounter (Signed)
OK to fill this prescription with additional refills x4 Thank you!  

## 2010-12-10 ENCOUNTER — Other Ambulatory Visit (INDEPENDENT_AMBULATORY_CARE_PROVIDER_SITE_OTHER): Payer: BC Managed Care – PPO

## 2010-12-10 DIAGNOSIS — E119 Type 2 diabetes mellitus without complications: Secondary | ICD-10-CM

## 2010-12-10 LAB — BASIC METABOLIC PANEL
Calcium: 9.1 mg/dL (ref 8.4–10.5)
Chloride: 107 mEq/L (ref 96–112)
Creatinine, Ser: 0.8 mg/dL (ref 0.4–1.5)

## 2010-12-10 LAB — HEMOGLOBIN A1C: Hgb A1c MFr Bld: 7.1 % — ABNORMAL HIGH (ref 4.6–6.5)

## 2010-12-16 ENCOUNTER — Encounter: Payer: Self-pay | Admitting: Internal Medicine

## 2010-12-16 ENCOUNTER — Ambulatory Visit (INDEPENDENT_AMBULATORY_CARE_PROVIDER_SITE_OTHER): Payer: BC Managed Care – PPO | Admitting: Internal Medicine

## 2010-12-16 DIAGNOSIS — M109 Gout, unspecified: Secondary | ICD-10-CM

## 2010-12-16 DIAGNOSIS — Z0000 Encounter for general adult medical examination without abnormal findings: Secondary | ICD-10-CM

## 2010-12-16 DIAGNOSIS — E119 Type 2 diabetes mellitus without complications: Secondary | ICD-10-CM

## 2010-12-16 DIAGNOSIS — I10 Essential (primary) hypertension: Secondary | ICD-10-CM

## 2010-12-16 NOTE — Assessment & Plan Note (Signed)
Better  

## 2010-12-16 NOTE — Progress Notes (Signed)
  Subjective:    Patient ID: Natale Milch, male    DOB: 1945-07-13, 65 y.o.   MRN: [id number]  HPI The patient presents for a follow-up of  chronic hypertension, chronic dyslipidemia, type 2 diabetes and gout controlled with medicines     Review of Systems  Constitutional: Negative for appetite change, fatigue and unexpected weight change.  HENT: Negative for nosebleeds, congestion, sore throat, sneezing, trouble swallowing and neck pain.   Eyes: Negative for itching and visual disturbance.  Respiratory: Negative for cough.   Cardiovascular: Negative for chest pain, palpitations and leg swelling.  Gastrointestinal: Negative for nausea, diarrhea, blood in stool and abdominal distention.  Genitourinary: Negative for frequency and hematuria.  Musculoskeletal: Negative for back pain, joint swelling and gait problem.  Skin: Negative for rash.  Neurological: Negative for dizziness, tremors, speech difficulty and weakness.  Psychiatric/Behavioral: Negative for sleep disturbance, dysphoric mood and agitation. The patient is not nervous/anxious.        Objective:   Physical Exam  Constitutional: He is oriented to person, place, and time. He appears well-developed.  HENT:  Mouth/Throat: Oropharynx is clear and moist.  Eyes: Conjunctivae are normal. Pupils are equal, round, and reactive to light.  Neck: Normal range of motion. No JVD present. No thyromegaly present.  Cardiovascular: Normal rate, regular rhythm, normal heart sounds and intact distal pulses.  Exam reveals no gallop and no friction rub.   No murmur heard. Pulmonary/Chest: Effort normal and breath sounds normal. No respiratory distress. He has no wheezes. He has no rales. He exhibits no tenderness.  Abdominal: Soft. Bowel sounds are normal. He exhibits no distension and no mass. There is no tenderness. There is no rebound and no guarding.  Musculoskeletal: Normal range of motion. He exhibits no edema and no tenderness.    Lymphadenopathy:    He has no cervical adenopathy.  Neurological: He is alert and oriented to person, place, and time. He has normal reflexes. No cranial nerve deficit. He exhibits normal muscle tone. Coordination normal.  Skin: Skin is warm and dry. No rash noted.  Psychiatric: He has a normal mood and affect. His behavior is normal. Judgment and thought content normal.      Lab Results  Component Value Date   WBC 5.9 12/12/2009   HGB 15.5 12/12/2009   HCT 45.2 12/12/2009   PLT 189.0 12/12/2009   CHOL 157 12/12/2009   TRIG 118.0 12/12/2009   HDL 41.00 12/12/2009   ALT 26 12/12/2009   AST 25 12/12/2009   NA 143 12/10/2010   K 4.6 12/10/2010   CL 107 12/10/2010   CREATININE 0.8 12/10/2010   BUN 14 12/10/2010   CO2 28 12/10/2010   TSH 1.26 12/12/2009   PSA 3.76 12/12/2009   HGBA1C 7.1* 12/10/2010       Assessment & Plan:

## 2010-12-16 NOTE — Assessment & Plan Note (Signed)
BP Readings from Last 3 Encounters:  12/16/10 130/88  09/12/10 120/90  04/28/10 146/94  On Rx

## 2010-12-19 ENCOUNTER — Encounter: Payer: Self-pay | Admitting: Internal Medicine

## 2010-12-19 ENCOUNTER — Ambulatory Visit: Payer: BC Managed Care – PPO | Admitting: Internal Medicine

## 2011-02-17 ENCOUNTER — Other Ambulatory Visit: Payer: Self-pay | Admitting: Internal Medicine

## 2011-03-26 ENCOUNTER — Ambulatory Visit: Payer: BC Managed Care – PPO

## 2011-03-26 DIAGNOSIS — R7309 Other abnormal glucose: Secondary | ICD-10-CM

## 2011-03-26 DIAGNOSIS — IMO0002 Reserved for concepts with insufficient information to code with codable children: Secondary | ICD-10-CM

## 2011-03-31 ENCOUNTER — Ambulatory Visit (INDEPENDENT_AMBULATORY_CARE_PROVIDER_SITE_OTHER): Payer: BC Managed Care – PPO | Admitting: Emergency Medicine

## 2011-03-31 DIAGNOSIS — M545 Low back pain: Secondary | ICD-10-CM

## 2011-04-07 ENCOUNTER — Other Ambulatory Visit (INDEPENDENT_AMBULATORY_CARE_PROVIDER_SITE_OTHER): Payer: BC Managed Care – PPO

## 2011-04-07 DIAGNOSIS — M109 Gout, unspecified: Secondary | ICD-10-CM

## 2011-04-07 DIAGNOSIS — E119 Type 2 diabetes mellitus without complications: Secondary | ICD-10-CM

## 2011-04-07 DIAGNOSIS — Z0000 Encounter for general adult medical examination without abnormal findings: Secondary | ICD-10-CM

## 2011-04-07 LAB — CBC WITH DIFFERENTIAL/PLATELET
Basophils Relative: 0.4 % (ref 0.0–3.0)
Eosinophils Absolute: 0.1 10*3/uL (ref 0.0–0.7)
HCT: 47.8 % (ref 39.0–52.0)
Lymphs Abs: 1.5 10*3/uL (ref 0.7–4.0)
MCHC: 34 g/dL (ref 30.0–36.0)
MCV: 93.3 fl (ref 78.0–100.0)
Monocytes Absolute: 0.7 10*3/uL (ref 0.1–1.0)
Neutrophils Relative %: 64.3 % (ref 43.0–77.0)
RBC: 5.12 Mil/uL (ref 4.22–5.81)

## 2011-04-07 LAB — COMPREHENSIVE METABOLIC PANEL
AST: 20 U/L (ref 0–37)
Albumin: 4.1 g/dL (ref 3.5–5.2)
Alkaline Phosphatase: 70 U/L (ref 39–117)
BUN: 23 mg/dL (ref 6–23)
Potassium: 5.1 mEq/L (ref 3.5–5.1)
Sodium: 141 mEq/L (ref 135–145)
Total Protein: 7.1 g/dL (ref 6.0–8.3)

## 2011-04-07 LAB — URINALYSIS
Bilirubin Urine: NEGATIVE
Hgb urine dipstick: NEGATIVE
Leukocytes, UA: NEGATIVE
Nitrite: NEGATIVE

## 2011-04-07 LAB — LIPID PANEL
LDL Cholesterol: 94 mg/dL (ref 0–99)
VLDL: 24.8 mg/dL (ref 0.0–40.0)

## 2011-04-07 LAB — HEMOGLOBIN A1C: Hgb A1c MFr Bld: 7.5 % — ABNORMAL HIGH (ref 4.6–6.5)

## 2011-04-20 ENCOUNTER — Encounter: Payer: Self-pay | Admitting: Internal Medicine

## 2011-04-20 ENCOUNTER — Ambulatory Visit (INDEPENDENT_AMBULATORY_CARE_PROVIDER_SITE_OTHER): Payer: BC Managed Care – PPO | Admitting: Internal Medicine

## 2011-04-20 VITALS — BP 140/92 | HR 76 | Temp 97.0°F | Resp 16 | Wt 161.0 lb

## 2011-04-20 DIAGNOSIS — I10 Essential (primary) hypertension: Secondary | ICD-10-CM

## 2011-04-20 DIAGNOSIS — R972 Elevated prostate specific antigen [PSA]: Secondary | ICD-10-CM

## 2011-04-20 DIAGNOSIS — M109 Gout, unspecified: Secondary | ICD-10-CM

## 2011-04-20 DIAGNOSIS — E7800 Pure hypercholesterolemia, unspecified: Secondary | ICD-10-CM

## 2011-04-20 DIAGNOSIS — E119 Type 2 diabetes mellitus without complications: Secondary | ICD-10-CM

## 2011-04-20 MED ORDER — LISINOPRIL 20 MG PO TABS: 20.0000 mg | ORAL_TABLET | Freq: Every day | ORAL | Status: DC

## 2011-04-20 MED ORDER — PIOGLITAZONE HCL 15 MG PO TABS: 15.0000 mg | ORAL_TABLET | Freq: Every day | ORAL | Status: DC

## 2011-04-20 NOTE — Assessment & Plan Note (Signed)
Increase Rx

## 2011-04-20 NOTE — Assessment & Plan Note (Signed)
Continue with current prescription therapy as reflected on the Med list.  

## 2011-04-20 NOTE — Progress Notes (Signed)
Subjective:    Patient ID: Jacob Chapman, male    DOB: August 13, 1945, 65 y.o.   MRN: [id number]  HPI The patient is here for a wellness exam. The patient has been doing well overall without major physical or psychological issues going on lately. The patient needs to address  chronic hypertension that has been well controlled with medicines; to address chronic  hyperlipidemia controlled with medicines as well; and to address type 2 chronic diabetes, controlled with medical treatment and diet.  BP Readings from Last 3 Encounters:  04/20/11 140/92  12/16/10 130/88  09/12/10 120/90   Wt Readings from Last 3 Encounters:  04/20/11 161 lb (73.029 kg)  12/16/10 160 lb (72.576 kg)  09/12/10 156 lb (70.761 kg)       Review of Systems  Constitutional: Negative for appetite change, fatigue and unexpected weight change.  HENT: Negative for nosebleeds, congestion, sore throat, sneezing, trouble swallowing and neck pain.   Eyes: Negative for itching and visual disturbance.  Respiratory: Negative for cough.   Cardiovascular: Negative for chest pain, palpitations and leg swelling.  Gastrointestinal: Negative for nausea, diarrhea, blood in stool and abdominal distention.  Genitourinary: Negative for frequency and hematuria.  Musculoskeletal: Negative for back pain, joint swelling and gait problem.  Skin: Negative for rash.  Neurological: Negative for dizziness, tremors, speech difficulty and weakness.  Psychiatric/Behavioral: Negative for suicidal ideas, sleep disturbance, dysphoric mood and agitation. The patient is not nervous/anxious.        Objective:   Physical Exam  Constitutional: He is oriented to person, place, and time. He appears well-developed and well-nourished. No distress.  HENT:  Head: Normocephalic and atraumatic.  Right Ear: External ear normal.  Left Ear: External ear normal.  Nose: Nose normal.  Mouth/Throat: Oropharynx is clear and moist. No oropharyngeal exudate.    Eyes: Conjunctivae and EOM are normal. Pupils are equal, round, and reactive to light. Right eye exhibits no discharge. Left eye exhibits no discharge. No scleral icterus.  Neck: Normal range of motion. Neck supple. No JVD present. No tracheal deviation present. No thyromegaly present.  Cardiovascular: Normal rate, regular rhythm, normal heart sounds and intact distal pulses.  Exam reveals no gallop and no friction rub.   No murmur heard. Pulmonary/Chest: Effort normal and breath sounds normal. No stridor. No respiratory distress. He has no wheezes. He has no rales. He exhibits no tenderness.  Abdominal: Soft. Bowel sounds are normal. He exhibits no distension and no mass. There is no tenderness. There is no rebound and no guarding.  Genitourinary: Rectum normal, prostate normal and penis normal. Guaiac negative stool. No penile tenderness.  Musculoskeletal: Normal range of motion. He exhibits no edema and no tenderness.  Lymphadenopathy:    He has no cervical adenopathy.  Neurological: He is alert and oriented to person, place, and time. He has normal reflexes. No cranial nerve deficit. He exhibits normal muscle tone. Coordination normal.  Skin: Skin is warm and dry. No rash noted. He is not diaphoretic. No erythema. No pallor.  Psychiatric: He has a normal mood and affect. His behavior is normal. Judgment and thought content normal.      Lab Results  Component Value Date   WBC 6.7 04/07/2011   HGB 16.3 04/07/2011   HCT 47.8 04/07/2011   PLT 192.0 04/07/2011   GLUCOSE 136* 04/07/2011   CHOL 159 04/07/2011   TRIG 124.0 04/07/2011   HDL 40.40 04/07/2011   LDLCALC 94 04/07/2011   ALT 27 04/07/2011   AST 20  04/07/2011   NA 141 04/07/2011   K 5.1 04/07/2011   CL 107 04/07/2011   CREATININE 1.0 04/07/2011   BUN 23 04/07/2011   CO2 26 04/07/2011   TSH 1.63 04/07/2011   PSA 5.88* 04/07/2011   HGBA1C 7.5* 04/07/2011       Assessment & Plan:

## 2011-04-20 NOTE — Assessment & Plan Note (Signed)
Not too well controlled Added Actos 15

## 2011-04-29 DIAGNOSIS — Z0279 Encounter for issue of other medical certificate: Secondary | ICD-10-CM

## 2011-05-11 ENCOUNTER — Telehealth: Payer: Self-pay | Admitting: *Deleted

## 2011-05-11 NOTE — Telephone Encounter (Signed)
Pt's wife calling re: lisinopril 20 mg Rx. She states AVP advised pt to increase this to BID and changed quantity to 180 for mail order. However, the sig still reads 1 qd. Based on OV note I see where AVP wanted pt to increase HTN med but did not specifically state which med and how much.  Please advise ok to change sig for lisinopril to bid? Caller is aware that Dr. Posey Rea is out of office until Wednesday.

## 2011-05-11 NOTE — Telephone Encounter (Signed)
pls change to bid thx

## 2011-05-12 MED ORDER — LISINOPRIL 20 MG PO TABS: 20.0000 mg | ORAL_TABLET | Freq: Two times a day (BID) | ORAL | Status: DC

## 2011-05-12 NOTE — Telephone Encounter (Signed)
Pt's spouse advised of Rx update and refill/pharmacy

## 2011-07-05 ENCOUNTER — Encounter: Payer: Self-pay | Admitting: Internal Medicine

## 2011-07-20 ENCOUNTER — Other Ambulatory Visit (INDEPENDENT_AMBULATORY_CARE_PROVIDER_SITE_OTHER): Payer: BC Managed Care – PPO

## 2011-07-20 DIAGNOSIS — I10 Essential (primary) hypertension: Secondary | ICD-10-CM

## 2011-07-20 DIAGNOSIS — R972 Elevated prostate specific antigen [PSA]: Secondary | ICD-10-CM

## 2011-07-20 DIAGNOSIS — M109 Gout, unspecified: Secondary | ICD-10-CM

## 2011-07-20 DIAGNOSIS — E119 Type 2 diabetes mellitus without complications: Secondary | ICD-10-CM

## 2011-07-20 DIAGNOSIS — E7800 Pure hypercholesterolemia, unspecified: Secondary | ICD-10-CM

## 2011-07-20 LAB — BASIC METABOLIC PANEL
BUN: 17 mg/dL (ref 6–23)
CO2: 27 mEq/L (ref 19–32)
Calcium: 9.2 mg/dL (ref 8.4–10.5)
Creatinine, Ser: 0.8 mg/dL (ref 0.4–1.5)
Glucose, Bld: 131 mg/dL — ABNORMAL HIGH (ref 70–99)

## 2011-07-20 LAB — HEPATIC FUNCTION PANEL
AST: 24 U/L (ref 0–37)
Alkaline Phosphatase: 62 U/L (ref 39–117)
Bilirubin, Direct: 0.1 mg/dL (ref 0.0–0.3)
Total Bilirubin: 0.5 mg/dL (ref 0.3–1.2)

## 2011-08-03 ENCOUNTER — Encounter: Payer: Self-pay | Admitting: Internal Medicine

## 2011-08-03 ENCOUNTER — Ambulatory Visit (INDEPENDENT_AMBULATORY_CARE_PROVIDER_SITE_OTHER): Payer: BC Managed Care – PPO | Admitting: Internal Medicine

## 2011-08-03 VITALS — BP 128/94 | HR 92 | Temp 97.4°F | Resp 16 | Wt 158.8 lb

## 2011-08-03 DIAGNOSIS — R972 Elevated prostate specific antigen [PSA]: Secondary | ICD-10-CM

## 2011-08-03 DIAGNOSIS — E119 Type 2 diabetes mellitus without complications: Secondary | ICD-10-CM

## 2011-08-03 DIAGNOSIS — I10 Essential (primary) hypertension: Secondary | ICD-10-CM

## 2011-08-03 DIAGNOSIS — M109 Gout, unspecified: Secondary | ICD-10-CM

## 2011-08-03 DIAGNOSIS — E785 Hyperlipidemia, unspecified: Secondary | ICD-10-CM

## 2011-08-03 DIAGNOSIS — M545 Low back pain: Secondary | ICD-10-CM

## 2011-08-03 MED ORDER — PIOGLITAZONE HCL 30 MG PO TABS: 30.0000 mg | ORAL_TABLET | Freq: Every day | ORAL | Status: DC

## 2011-08-03 NOTE — Assessment & Plan Note (Signed)
Continue with current prescription therapy as reflected on the Med list.  

## 2011-08-03 NOTE — Progress Notes (Signed)
Patient ID: Jacob Chapman, male   DOB: 1946/03/30, 66 y.o.   MRN: [id number]  Subjective:    Patient ID: Jacob Chapman, male    DOB: 1945-06-25, 66 y.o.   MRN: [id number]  HPI The patient presents for a follow-up of  chronic hypertension, chronic dyslipidemia, type 2 diabetes and gout controlled with medicines  BP Readings from Last 3 Encounters:  08/03/11 128/94  04/20/11 140/92  12/16/10 130/88   Wt Readings from Last 3 Encounters:  08/03/11 158 lb 12 oz (72.009 kg)  04/20/11 161 lb (73.029 kg)  12/16/10 160 lb (72.576 kg)       Review of Systems  Constitutional: Negative for appetite change, fatigue and unexpected weight change.  HENT: Negative for nosebleeds, congestion, sore throat, sneezing, trouble swallowing and neck pain.   Eyes: Negative for itching and visual disturbance.  Respiratory: Negative for cough.   Cardiovascular: Negative for chest pain, palpitations and leg swelling.  Gastrointestinal: Negative for nausea, diarrhea, blood in stool and abdominal distention.  Genitourinary: Negative for frequency and hematuria.  Musculoskeletal: Negative for back pain, joint swelling and gait problem.  Skin: Negative for rash.  Neurological: Negative for dizziness, tremors, speech difficulty and weakness.  Psychiatric/Behavioral: Negative for sleep disturbance, dysphoric mood and agitation. The patient is not nervous/anxious.        Objective:   Physical Exam  Constitutional: He is oriented to person, place, and time. He appears well-developed.  HENT:  Mouth/Throat: Oropharynx is clear and moist.  Eyes: Conjunctivae are normal. Pupils are equal, round, and reactive to light.  Neck: Normal range of motion. No JVD present. No thyromegaly present.  Cardiovascular: Normal rate, regular rhythm, normal heart sounds and intact distal pulses.  Exam reveals no gallop and no friction rub.   No murmur heard. Pulmonary/Chest: Effort normal and breath sounds normal. No  respiratory distress. He has no wheezes. He has no rales. He exhibits no tenderness.  Abdominal: Soft. Bowel sounds are normal. He exhibits no distension and no mass. There is no tenderness. There is no rebound and no guarding.  Musculoskeletal: Normal range of motion. He exhibits no edema and no tenderness.  Lymphadenopathy:    He has no cervical adenopathy.  Neurological: He is alert and oriented to person, place, and time. He has normal reflexes. No cranial nerve deficit. He exhibits normal muscle tone. Coordination normal.  Skin: Skin is warm and dry. No rash noted.  Psychiatric: He has a normal mood and affect. His behavior is normal. Judgment and thought content normal.      Lab Results  Component Value Date   WBC 6.7 04/07/2011   HGB 16.3 04/07/2011   HCT 47.8 04/07/2011   PLT 192.0 04/07/2011   CHOL 159 04/07/2011   TRIG 124.0 04/07/2011   HDL 40.40 04/07/2011   ALT 26 07/20/2011   AST 24 07/20/2011   NA 141 07/20/2011   K 4.7 07/20/2011   CL 108 07/20/2011   CREATININE 0.8 07/20/2011   BUN 17 07/20/2011   CO2 27 07/20/2011   TSH 1.63 04/07/2011   PSA 6.73* 07/20/2011   HGBA1C 7.5* 07/20/2011       Assessment & Plan:

## 2011-08-03 NOTE — Assessment & Plan Note (Addendum)
Continue with current prescription therapy as reflected on the Med list. prn 

## 2011-08-03 NOTE — Assessment & Plan Note (Addendum)
Watching PSA. Urol ref  Lab Results  Component Value Date   PSA 6.73* 07/20/2011   PSA 5.88* 04/07/2011   PSA 3.76 12/12/2009

## 2011-08-03 NOTE — Assessment & Plan Note (Signed)
Better  

## 2011-08-03 NOTE — Patient Instructions (Signed)
Wt Readings from Last 3 Encounters:  08/03/11 158 lb 12 oz (72.009 kg)  04/20/11 161 lb (73.029 kg)  12/16/10 160 lb (72.576 kg)

## 2011-08-03 NOTE — Assessment & Plan Note (Addendum)
Increase actos to 30 mg/d. Cont other meds

## 2011-09-01 DIAGNOSIS — C61 Malignant neoplasm of prostate: Secondary | ICD-10-CM | POA: Insufficient documentation

## 2011-09-01 DIAGNOSIS — Z8546 Personal history of malignant neoplasm of prostate: Secondary | ICD-10-CM | POA: Insufficient documentation

## 2011-09-01 HISTORY — DX: Malignant neoplasm of prostate: C61

## 2011-09-08 ENCOUNTER — Encounter (HOSPITAL_COMMUNITY): Payer: Self-pay | Admitting: Emergency Medicine

## 2011-09-08 ENCOUNTER — Emergency Department (HOSPITAL_COMMUNITY)
Admission: EM | Admit: 2011-09-08 | Discharge: 2011-09-08 | Disposition: A | Discharge status: Home / Self Care | Payer: BC Managed Care – PPO | Attending: Emergency Medicine | Admitting: Emergency Medicine

## 2011-09-08 DIAGNOSIS — Z79899 Other long term (current) drug therapy: Secondary | ICD-10-CM | POA: Insufficient documentation

## 2011-09-08 DIAGNOSIS — M1990 Unspecified osteoarthritis, unspecified site: Secondary | ICD-10-CM | POA: Insufficient documentation

## 2011-09-08 DIAGNOSIS — Z87442 Personal history of urinary calculi: Secondary | ICD-10-CM | POA: Insufficient documentation

## 2011-09-08 DIAGNOSIS — E119 Type 2 diabetes mellitus without complications: Secondary | ICD-10-CM | POA: Insufficient documentation

## 2011-09-08 DIAGNOSIS — I10 Essential (primary) hypertension: Secondary | ICD-10-CM | POA: Insufficient documentation

## 2011-09-08 DIAGNOSIS — Z8639 Personal history of other endocrine, nutritional and metabolic disease: Secondary | ICD-10-CM | POA: Insufficient documentation

## 2011-09-08 DIAGNOSIS — E785 Hyperlipidemia, unspecified: Secondary | ICD-10-CM | POA: Insufficient documentation

## 2011-09-08 DIAGNOSIS — N509 Disorder of male genital organs, unspecified: Secondary | ICD-10-CM | POA: Insufficient documentation

## 2011-09-08 DIAGNOSIS — Z7982 Long term (current) use of aspirin: Secondary | ICD-10-CM | POA: Insufficient documentation

## 2011-09-08 DIAGNOSIS — Z862 Personal history of diseases of the blood and blood-forming organs and certain disorders involving the immune mechanism: Secondary | ICD-10-CM | POA: Insufficient documentation

## 2011-09-08 DIAGNOSIS — N50819 Testicular pain, unspecified: Secondary | ICD-10-CM

## 2011-09-08 MED ORDER — NAPROXEN 500 MG PO TABS: 500.0000 mg | ORAL_TABLET | Freq: Two times a day (BID) | ORAL | Status: DC

## 2011-09-08 NOTE — ED Notes (Signed)
Pt c/o right groin pain. Pt reports he had a prostate biopsy last week, pain started last night. Pt stated the pain was very sharp at first but pain is starting to go away but he is concerned about what is causing it. Pt took 800 mg ibuprofen at 10pm.

## 2011-09-08 NOTE — Discharge Instructions (Signed)
Your exam is normal, if you should develop worsening or severe pain or swelling of your testicle or scrotum return to the hospital immediately at [facility].  Please inform your urologist about your ER visit, they can access this medical record electronically.

## 2011-09-08 NOTE — ED Provider Notes (Signed)
History     CSN: [id number]  Arrival date & time 09/08/11  0151   First MD Initiated Contact with Patient 09/08/11 0235      Chief Complaint  Patient presents with  . Groin Pain    (Consider location/radiation/quality/duration/timing/severity/associated sxs/prior treatment) HPI Comments: Patient had acute onset of right testicular pain approximately 7:00 PM. This pain waxed and waned but was persistent for approximately 5 hours. About 1:00 in the morning the pain seemed to resolve and he has not had the pain since. He has not had any associated swelling, redness, nausea or vomiting. He did have a prostate biopsy that was done from a rectal approach approximately one week ago, he has not had any pain swelling or complications from this procedure since that time. He has a urology followup in the office in the next several days. He denies any dysuria, urethral discharge or penile bleeding or hematuria  Patient is a 66 y.o. male presenting with groin pain. The history is provided by the patient and the spouse.  Groin Pain    Past Medical History  Diagnosis Date  . Diabetes mellitus, type 2   . Gout   . Hyperlipidemia   . HTN (hypertension)   . Osteoarthritis   . LBP (low back pain)   . Kidney stone 2011    Dr Vernie Ammons    History reviewed. No pertinent past surgical history.  Family History  Problem Relation Age of Onset  . Hypertension Other   . Stroke Mother     4  . Cancer Father 52    lung ca  . Heart disease Sister 79    rhematic fever    History  Substance Use Topics  . Smoking status: Never Smoker   . Smokeless tobacco: Not on file  . Alcohol Use: No      Review of Systems  Constitutional: Negative for fever and chills.  Genitourinary: Positive for testicular pain. Negative for dysuria, frequency, hematuria, flank pain, discharge, penile swelling, scrotal swelling and penile pain.  Musculoskeletal: Negative for back pain.    Allergies   Metformin  Home Medications   Current Outpatient Rx  Name Route Sig Dispense Refill  . ASPIRIN 81 MG PO TBEC Oral Take 81 mg by mouth daily.      Marland Kitchen VITAMIN D3 1000 UNITS PO TABS Oral Take 1,000 Units by mouth daily.      Marland Kitchen GLIMEPIRIDE 1 MG PO TABS Oral Take 0.5-1 mg by mouth 2 (two) times daily. 1mg  every morning and 0.5mg  at dinner    . IBUPROFEN 800 MG PO TABS Oral Take 800 mg by mouth 3 (three) times daily as needed. For pain    . LISINOPRIL 20 MG PO TABS Oral Take 20 mg by mouth 2 (two) times daily.    Marland Kitchen LOVASTATIN 20 MG PO TABS Oral Take 20 mg by mouth at bedtime.    Marland Kitchen PIOGLITAZONE HCL 30 MG PO TABS Oral Take 30 mg by mouth daily.    Marland Kitchen VITAMIN B-12 500 MCG PO TABS Oral Take 500 mcg by mouth daily.      Marland Kitchen NAPROXEN 500 MG PO TABS Oral Take 1 tablet (500 mg total) by mouth 2 (two) times daily with a meal. 30 tablet 0    BP 165/100  Pulse 59  Temp(Src) 97.7 F (36.5 C) (Oral)  Resp 19  SpO2 97%  Physical Exam  Nursing note and vitals reviewed. Constitutional: He appears well-developed and well-nourished. No distress.  HENT:  Head: Normocephalic and atraumatic.  Eyes: Conjunctivae are normal. No scleral icterus.  Cardiovascular: Normal rate, regular rhythm and intact distal pulses.   Pulmonary/Chest: Effort normal and breath sounds normal.  Genitourinary:       Right testicle with normal appearance, normal lie in the scrotum, normal cremasteric reflex, no hernias palpated. Left testicle with normal appearance, normal lie, normal cremasteric reflex, no hernias palpated. No urethral discharge, normal appearing scrotum without erythema swelling or  Musculoskeletal: He exhibits no edema.  Neurological: He is alert.  Skin: Skin is warm and dry. No rash noted. He is not diaphoretic.    ED Course  Procedures (including critical care time)  Labs Reviewed - No data to display No results found.   1. Testicular pain       MDM  At this time the patient has a totally normal  testicular and scrotal exam. I doubt that there is a portion other meds been intermittent type torsion pains overnight. He denies any history of having swelling or pain with palpation. The symptoms have totally resolved, his blood pressure has improved significantly prior to discharge and the patient is under the understanding that he needs to followup with his urologist.        Vida Roller, MD 09/08/11 0700

## 2011-09-08 NOTE — ED Notes (Signed)
PT. REPORTS RIGHT GROIN PAIN , DENIES INJURY , STATES PROSTATE BIOPSY LAST Tuesday , DENIES DYSURIA OR FEVER.

## 2011-09-22 ENCOUNTER — Other Ambulatory Visit: Payer: Self-pay | Admitting: Internal Medicine

## 2011-09-23 ENCOUNTER — Other Ambulatory Visit: Payer: Self-pay | Admitting: *Deleted

## 2011-09-23 MED ORDER — LISINOPRIL 20 MG PO TABS: 20.0000 mg | ORAL_TABLET | Freq: Two times a day (BID) | ORAL | Status: DC

## 2011-09-25 ENCOUNTER — Other Ambulatory Visit: Payer: Self-pay

## 2011-09-25 MED ORDER — LISINOPRIL 20 MG PO TABS: 20.0000 mg | ORAL_TABLET | Freq: Two times a day (BID) | ORAL | Status: DC

## 2011-09-29 NOTE — Progress Notes (Signed)
Patient found to have PSA 6.73 in 4/13                                    PSA 3.76 in 9/11 Prostate volume 21 cc's.

## 2011-09-30 ENCOUNTER — Encounter: Payer: Self-pay | Admitting: Radiation Oncology

## 2011-10-01 ENCOUNTER — Encounter: Payer: Self-pay | Admitting: Radiation Oncology

## 2011-10-01 ENCOUNTER — Ambulatory Visit
Admission: RE | Admit: 2011-10-01 | Discharge: 2011-10-01 | Disposition: A | Discharge status: Home / Self Care | Payer: BC Managed Care – PPO | Source: Ambulatory Visit | Attending: Radiation Oncology | Admitting: Radiation Oncology

## 2011-10-01 ENCOUNTER — Other Ambulatory Visit: Payer: Self-pay | Admitting: Urology

## 2011-10-01 ENCOUNTER — Telehealth: Payer: Self-pay | Admitting: *Deleted

## 2011-10-01 VITALS — BP 131/89 | HR 64 | Temp 97.7°F | Resp 20 | Wt 158.5 lb

## 2011-10-01 DIAGNOSIS — E119 Type 2 diabetes mellitus without complications: Secondary | ICD-10-CM | POA: Insufficient documentation

## 2011-10-01 DIAGNOSIS — Z79899 Other long term (current) drug therapy: Secondary | ICD-10-CM | POA: Insufficient documentation

## 2011-10-01 DIAGNOSIS — C61 Malignant neoplasm of prostate: Secondary | ICD-10-CM | POA: Insufficient documentation

## 2011-10-01 DIAGNOSIS — I10 Essential (primary) hypertension: Secondary | ICD-10-CM | POA: Insufficient documentation

## 2011-10-01 NOTE — Progress Notes (Signed)
Path:Prostate Bx=09/01/11=Adenocarcinoma, gleason=3+3=6, volume=21cc.PSA=6.73 PSA=12/2009=3.76   patient alert,orineted x 3, Married,1 son, no c/o pain,no dysuria, no hesitancy,   Allergies:metformin=intolerant=sick on his stomach=vagels 7:52 AM

## 2011-10-01 NOTE — Progress Notes (Signed)
Radiation Oncology         [phone] ________________________________  Initial outpatient Consultation  Name: Jacob Chapman MRN: [id number]  Date: 10/01/2011  DOB: [birth year]  JY:NWGN Plotnikov, MD  Garnett Farm, MD   REFERRING PHYSICIAN: Garnett Farm, MD  DIAGNOSIS: 66 year old gentleman with stage T2a adenocarcinoma prostate Gleason score 3+3 PSA of 6.73  HISTORY OF PRESENT ILLNESS::Jacob Chapman is a 66 y.o. gentleman who was noted an elevated PSA at by his primary care physician. Dr. Posey Rea noted an elevated PSA of 5.88 on 04/07/2011. A short interval followup PSA on 07/20/2011 remained elevated at 6.73. Consequently, the patient was referred for urologic evaluation and meds with Dr. Vernie Ammons on April 30.  Digital rectal exam at that time revealed induration of the right side of the prostate. The patient proceeded to transrectal ultrasound prostate biopsy on May 14. The prostate volume was 21.19 cc. A total of 12 core biopsies were obtained and 5/12 were positive for adenocarcinoma with Gleason score of 3+3. Tumor involvement was seen in 5% of the right lateral base, 30% right mid, and 25% right apex with 15% involving the right mid gland. 5% involving left lateral base is also noted. The patient discuss the biopsy results with urology and he is currently been referred today for discussion of potential radiation treatment options  PREVIOUS RADIATION THERAPY: No  PAST MEDICAL HISTORY:  has a past medical history of Gout; Hyperlipidemia; HTN (hypertension); Osteoarthritis; LBP (low back pain); Kidney stone (2011); Allergy; Diabetes mellitus, type 2; and Prostate cancer (09/01/11).    PAST SURGICAL HISTORY: Past Surgical History  Procedure Date  . Prostate biopsy 09/01/2011    gleason 3+3=6,volume=21cc,PSA=6.73    FAMILY HISTORY: family history includes Cancer (age of onset:57) in his father; Heart disease (age of onset:31) in his sister; Hypertension (age of onset:47) in  his other; and Stroke in his mother.  He is adopted.  SOCIAL HISTORY:  reports that he has never smoked. He does not have any smokeless tobacco history on file. He reports that he does not drink alcohol or use illicit drugs.  ALLERGIES: Metformin  MEDICATIONS:  Current Outpatient Prescriptions  Medication Sig Dispense Refill  . aspirin 81 MG EC tablet Take 81 mg by mouth daily.        . Cholecalciferol (VITAMIN D3) 1000 UNITS tablet Take 1,000 Units by mouth daily.        Marland Kitchen glimepiride (AMARYL) 1 MG tablet Take 0.5-1 mg by mouth 2 (two) times daily. 1mg  every morning and 0.5mg  at dinner      . ibuprofen (ADVIL,MOTRIN) 800 MG tablet Take 800 mg by mouth 3 (three) times daily as needed. For pain      . lisinopril (PRINIVIL,ZESTRIL) 20 MG tablet Take 1 tablet (20 mg total) by mouth 2 (two) times daily.  180 tablet  1  . lovastatin (MEVACOR) 20 MG tablet TAKE 1 TABLET AT BEDTIME  90 tablet  3  . metaxalone (SKELAXIN) 800 MG tablet Take 800 mg by mouth 3 (three) times daily as needed.       . naproxen (NAPROSYN) 500 MG tablet Take 1 tablet (500 mg total) by mouth 2 (two) times daily with a meal.  30 tablet  0  . pioglitazone (ACTOS) 30 MG tablet Take 30 mg by mouth daily.      . vitamin B-12 (CYANOCOBALAMIN) 500 MCG tablet Take 500 mcg by mouth daily.          REVIEW OF SYSTEMS:  A 15 point review of systems is documented in the electronic medical record. This was obtained by the nursing staff. However, I reviewed this with the patient to discuss relevant findings and make appropriate changes.  A comprehensive review of systems was negative. his urinary function questionnaire revealed minimal urinary outflow start of symptoms with an IPSS score of 2. He his erectile function questionnaire revealed no erectile dysfunction whatsoever.   PHYSICAL EXAM:  weight is 158 lb 8 oz (71.895 kg). His oral temperature is 97.7 F (36.5 C). His blood pressure is 131/89 and his pulse is 64. His respiration is 20.    Patient is in no acute distress today. Is alert and oriented. His respiratory effort is unremarkable. Speech is fluent radicular. Gait is unremarkable extremities are free of cyanosis clubbing or edema. Digital rectal exam was described in the urology noted above. The patient's right side of the prostate gland was indurated.  LABORATORY DATA:  Lab Results  Component Value Date   WBC 6.7 04/07/2011   HGB 16.3 04/07/2011   HCT 47.8 04/07/2011   MCV 93.3 04/07/2011   PLT 192.0 04/07/2011   Lab Results  Component Value Date   NA 141 07/20/2011   K 4.7 07/20/2011   CL 108 07/20/2011   CO2 27 07/20/2011   Lab Results  Component Value Date   ALT 26 07/20/2011   AST 24 07/20/2011   ALKPHOS 62 07/20/2011   BILITOT 0.5 07/20/2011     RADIOGRAPHY: No results found.    IMPRESSION: The patient is a very nice 66 year old gentleman with stage TII adenocarcinoma with a Gleason score of 3+3 and PSA of 6.73. He falls into a favorable category and is eligible for a variety of potential treatment options rangingto include watchful waiting, radical prostatectomy, external beam radiation treatment and prostate brachytherapy.   PLAN:Today I reviewed the findings and workup thus far.  We discussed the natural history of prostate cancer.  We reviewed the the implications of T-stage, Gleason's Score, and PSA on decision-making and outcomes in prostate cancer.  We discussed radiation treatment in the management of prostate cancer with regard to the logistics and delivery of external beam radiation treatment as well as the logistics and delivery of prostate brachytherapy.  We compared and contrasted each of these approaches and also compared these against prostatectomy.  The patient expressed interest in prostate brachytherapy.  I filled out a patient counseling form for him with relevant treatment diagrams and we retained a copy for our records.   The patient would like to proceed with prostate brachytherapy.  I will share  my findings with Dr. Vernie Ammons and move forward with scheduling the procedure in the near future.     I enjoyed meeting with him today, and will look forward to participating in the care of this very nice gentleman.    I spent 60 minutes minutes face to face with the patient and more than 50% of that time was spent in counseling and/or coordination of care.   ------------------------------------------------  Artist Pais. Kathrynn Running, M.D.

## 2011-10-01 NOTE — Telephone Encounter (Signed)
Called patient to inform of appt. For pre-seed planning CT for 10-02-11 at 9:00 am, lvm for a return call

## 2011-10-01 NOTE — Progress Notes (Signed)
Please see the Nurse Progress Note in the MD Initial Consult Encounter for this patient. 

## 2011-10-02 ENCOUNTER — Ambulatory Visit
Admission: RE | Admit: 2011-10-02 | Discharge: 2011-10-02 | Disposition: A | Discharge status: Home / Self Care | Payer: BC Managed Care – PPO | Source: Ambulatory Visit | Attending: Radiation Oncology | Admitting: Radiation Oncology

## 2011-10-02 ENCOUNTER — Ambulatory Visit (HOSPITAL_BASED_OUTPATIENT_CLINIC_OR_DEPARTMENT_OTHER)
Admission: RE | Admit: 2011-10-02 | Discharge: 2011-10-02 | Disposition: A | Discharge status: Home / Self Care | Payer: BC Managed Care – PPO | Source: Ambulatory Visit | Attending: Urology | Admitting: Urology

## 2011-10-02 ENCOUNTER — Encounter: Payer: Self-pay | Admitting: Radiation Oncology

## 2011-10-02 ENCOUNTER — Encounter (HOSPITAL_BASED_OUTPATIENT_CLINIC_OR_DEPARTMENT_OTHER)
Admission: RE | Admit: 2011-10-02 | Discharge: 2011-10-02 | Disposition: A | Discharge status: Home / Self Care | Payer: BC Managed Care – PPO | Source: Ambulatory Visit | Attending: Urology | Admitting: Urology

## 2011-10-02 ENCOUNTER — Other Ambulatory Visit: Payer: Self-pay

## 2011-10-02 DIAGNOSIS — C61 Malignant neoplasm of prostate: Secondary | ICD-10-CM

## 2011-10-02 DIAGNOSIS — Z01818 Encounter for other preprocedural examination: Secondary | ICD-10-CM | POA: Insufficient documentation

## 2011-10-02 DIAGNOSIS — I10 Essential (primary) hypertension: Secondary | ICD-10-CM | POA: Insufficient documentation

## 2011-10-02 DIAGNOSIS — R9431 Abnormal electrocardiogram [ECG] [EKG]: Secondary | ICD-10-CM | POA: Insufficient documentation

## 2011-10-02 DIAGNOSIS — E119 Type 2 diabetes mellitus without complications: Secondary | ICD-10-CM | POA: Insufficient documentation

## 2011-10-02 NOTE — Progress Notes (Signed)
  Radiation Oncology         [phone]) [phone] ________________________________  Name: Jacob Chapman MRN: [id number]  Date: 10/02/2011  DOB: February 28, 1946  SIMULATION AND TREATMENT PLANNING NOTE PUBIC ARCH STUDY  QM:VHQI Plotnikov, MD  Garnett Farm, MD  DIAGNOSIS: 66 year old gentleman with stage T2a adenocarcinoma prostate Gleason score 3+3 PSA of 6.73  COMPLEX SIMULATION:  The patient presented today for evaluation for possible prostate seed implant. He was brought to the radiation planning suite and placed supine on the CT couch. A 3-dimensional image study set was obtained in upload to the planning computer. There, on each axial slice, I contoured the prostate gland. Then, using three-dimensional radiation planning tools I reconstructed the prostate in view of the structures from the transperineal needle pathway to assess for possible pubic arch interference. In doing so, I did not appreciate any pubic arch interference. Also, the patient's prostate volume was estimated based on the drawn structure. The volume was 30.3 cc.  Given the pubic arch appearance and prostate volume, patient remains a good candidate to proceed with prostate seed implant. Today, he freely provided informed written consent to proceed.    PLAN: The patient will undergo prostate seed implant.   ________________________________  Artist Pais. Kathrynn Running, M.D.

## 2011-10-06 NOTE — Addendum Note (Signed)
Encounter addended by: Tessa Lerner, RN on: 10/06/2011  4:00 PM<BR>     Documentation filed: Charges VN

## 2011-10-12 NOTE — Addendum Note (Signed)
Encounter addended by: Agnes Lawrence, RN on: 10/12/2011  4:55 PM<BR>     Documentation filed: Charges VN

## 2011-10-19 ENCOUNTER — Other Ambulatory Visit (INDEPENDENT_AMBULATORY_CARE_PROVIDER_SITE_OTHER): Payer: BC Managed Care – PPO

## 2011-10-19 DIAGNOSIS — M109 Gout, unspecified: Secondary | ICD-10-CM

## 2011-10-19 DIAGNOSIS — R972 Elevated prostate specific antigen [PSA]: Secondary | ICD-10-CM

## 2011-10-19 DIAGNOSIS — E785 Hyperlipidemia, unspecified: Secondary | ICD-10-CM

## 2011-10-19 DIAGNOSIS — I10 Essential (primary) hypertension: Secondary | ICD-10-CM

## 2011-10-19 DIAGNOSIS — E119 Type 2 diabetes mellitus without complications: Secondary | ICD-10-CM

## 2011-10-19 DIAGNOSIS — M545 Low back pain: Secondary | ICD-10-CM

## 2011-10-19 LAB — BASIC METABOLIC PANEL
CO2: 26 mEq/L (ref 19–32)
Calcium: 9.1 mg/dL (ref 8.4–10.5)
Chloride: 108 mEq/L (ref 96–112)
Glucose, Bld: 126 mg/dL — ABNORMAL HIGH (ref 70–99)
Potassium: 5.1 mEq/L (ref 3.5–5.1)
Sodium: 141 mEq/L (ref 135–145)

## 2011-10-29 ENCOUNTER — Telehealth: Payer: Self-pay | Admitting: *Deleted

## 2011-10-29 NOTE — Telephone Encounter (Signed)
Called patient to remind of appt., spoke with patient and he is aware of this appt. 

## 2011-10-30 ENCOUNTER — Encounter (HOSPITAL_BASED_OUTPATIENT_CLINIC_OR_DEPARTMENT_OTHER): Payer: Self-pay | Admitting: *Deleted

## 2011-10-30 LAB — COMPREHENSIVE METABOLIC PANEL
ALT: 20 U/L (ref 0–53)
AST: 18 U/L (ref 0–37)
Albumin: 3.6 g/dL (ref 3.5–5.2)
Alkaline Phosphatase: 62 U/L (ref 39–117)
BUN: 12 mg/dL (ref 6–23)
CO2: 27 mEq/L (ref 19–32)
Calcium: 9.2 mg/dL (ref 8.4–10.5)
Chloride: 106 mEq/L (ref 96–112)
Creatinine, Ser: 0.9 mg/dL (ref 0.50–1.35)
GFR calc Af Amer: >90 mL/min (ref >90)
GFR calc non Af Amer: 89 mL/min — ABNORMAL LOW (ref >90)
Glucose, Bld: 114 mg/dL — ABNORMAL HIGH (ref 70–99)
Potassium: 4.8 mEq/L (ref 3.5–5.1)
Sodium: 140 mEq/L (ref 135–145)
Total Bilirubin: 0.4 mg/dL (ref 0.3–1.2)
Total Protein: 6.7 g/dL (ref 6.0–8.3)

## 2011-10-30 LAB — PROTIME-INR
INR: 0.92 (ref 0.00–1.49)
Prothrombin Time: 12.6 seconds (ref 11.6–15.2)

## 2011-10-30 LAB — CBC
HCT: 45 % (ref 39.0–52.0)
Hemoglobin: 15.3 g/dL (ref 13.0–17.0)
MCH: 31.5 pg (ref 26.0–34.0)
MCHC: 34 g/dL (ref 30.0–36.0)
MCV: 92.6 fL (ref 78.0–100.0)
Platelets: 180 10*3/uL (ref 150–400)
RBC: 4.86 MIL/uL (ref 4.22–5.81)
RDW: 13.5 % (ref 11.5–15.5)
WBC: 6 10*3/uL (ref 4.0–10.5)

## 2011-10-30 LAB — APTT: aPTT: 30 seconds (ref 24–37)

## 2011-10-30 NOTE — Progress Notes (Signed)
NPO AFTER MN. ARRIVES AT 0615. CURRENT LABS, EKG , AND CXR IN EPIC AND CHART. WILL DO FLEET ENEMA AM OF SURG.

## 2011-11-04 ENCOUNTER — Ambulatory Visit (INDEPENDENT_AMBULATORY_CARE_PROVIDER_SITE_OTHER): Payer: BC Managed Care – PPO | Admitting: Family Medicine

## 2011-11-04 VITALS — BP 132/84 | HR 70 | Temp 97.9°F | Resp 18 | Ht 66.0 in | Wt 158.0 lb

## 2011-11-04 DIAGNOSIS — L738 Other specified follicular disorders: Secondary | ICD-10-CM

## 2011-11-04 DIAGNOSIS — L739 Follicular disorder, unspecified: Secondary | ICD-10-CM

## 2011-11-04 MED ORDER — DOXYCYCLINE HYCLATE 100 MG PO CAPS: 100.0000 mg | ORAL_CAPSULE | Freq: Two times a day (BID) | ORAL | Status: DC

## 2011-11-04 NOTE — Progress Notes (Signed)
  Subjective:    Patient ID: Jacob Chapman, male    DOB: [birth year], 66 y.o.   MRN: [id number]  HPI HPI  This patient complains of a RASH  Location: neck  Onset: 3-4 days   Course: noticed lesions 2-3 days ago. Areas red and painful. Initially draining clear fluid. This has subsided. Lesions have increased in redness.   No trismus, drooling, trouble swallowing.  Self-treated with: nothing  Improvement with treatment: nothing  History  Itching: no  Tenderness: yes  New medications/antibiotics: no  Pet exposure: no  Recent travel or tropical exposure: no  New soaps, shampoos, detergent, clothing: no  Tick/insect exposure: no  Red Flags  Feeling ill: no  Fever: no  Facial/tongue swelling/difficulty breathing: no  Diabetic or immunocompromised: no Malignancy: yes; prostate ca pending radioactive seeding in 2-3 days      Review of Systems See HPI, otherwise ROS negative.     Objective:   Physical Exam  Neck:         2 focal well circumscribed erythematous lesions.  Mild TTP Mildly fluctuant.  Minimal soft tissue swelling.    Gen: up in chair, NAD HEENT: NCAT, EOMI, TMs clear bilaterally CV: RRR, no murmurs auscultated PULM: CTAB, no wheezes, rales, rhoncii ABD: S/NT/+ bowel sounds  EXT: 2+ peripheral pulses         Assessment & Plan:  Likely secondary folliculitis.  Given underlying prostate ca, will treat with doxy for general staph coverage.  Case also discussed with Dr Vernie Ammons at [facility] pending radioactive seeding.  Discussed infectious red flags.  Handout given.  Follow up in 1 week.     The patient and/or caregiver has been counseled thoroughly with regard to treatment plan and/or medications prescribed including dosage, schedule, interactions, rationale for use, and possible side effects and they verbalize understanding. Diagnoses and expected course of recovery discussed and will return if not improved as expected or if the condition  worsens. Patient and/or caregiver verbalized understanding.

## 2011-11-04 NOTE — Patient Instructions (Signed)
Folliculitis  °Folliculitis is an infection and inflammation of the hair follicles. Hair follicles become red and irritated. This inflammation is usually caused by bacteria. The bacteria thrive in warm, moist environments. This condition can be seen anywhere on the body.  °CAUSES °The most common cause of folliculitis is an infection by germs (bacteria). Fungal and viral infections can also cause the condition. Viral infections may be more common in people whose bodies are unable to fight disease well (weakened immune systems). Examples include people with: °· AIDS.  °· An organ transplant.  °· Cancer.  °People with depressed immune systems, diabetes, or obesity, have a greater risk of getting folliculitis than the general population. Certain chemicals, especially oils and tars, also can cause folliculitis. °SYMPTOMS °· An early sign of folliculitis is a small, white or yellow pus-filled, itchy lesion (pustule). These lesions appear on a red, inflamed follicle. They are usually less than 5 mm (.20 inches).  °· The most likely starting points are the scalp, thighs, legs, back and buttocks. Folliculitis is also frequently found in areas of repeated shaving.  °· When an infection of the follicle goes deeper, it becomes a boil or furuncle. A group of closely packed boils create a larger lesion (a carbuncle). These sores (lesions) tend to occur in hairy, sweaty areas of the body.  °TREATMENT  °· A doctor who specializes in skin problems (dermatologists) treats mild cases of folliculitis with antiseptic washes.  °· They also use a skin application which kills germs (topical antibiotics). Tea tree oil is a good topical antiseptic as well. It can be found at a health food store. A small percentage of individuals may develop an allergy to the tea tree oil.  °· Mild to moderate boils respond well to warm water compresses applied three times daily.  °· In some cases, oral antibiotics should be taken with the skin treatment.    °· If lesions contain large quantities of pus or fluid, your caregiver may drain them. This allows the topical antibiotics to get to the affected areas better.  °· Stubborn cases of folliculitis may respond to laser hair removal. This process uses a high intensity light beam (a laser) to destroy the follicle and reduces the scarring from folliculitis. After laser hair removal, hair will no longer grow in the laser treated area.  °Patients with long-lasting folliculitis need to find out where the infection is coming from. Germs can live in the nostrils of the patient. This can trigger an outbreak now and then. Sometimes the bacteria live in the nostrils of a family member. This person does not develop the disorder but they repeatedly re-expose others to the germ. To break the cycle of recurrence in the patient, the family member must also undergo treatment. °PREVENTION  °· Individuals who are predisposed to folliculitis should be extremely careful about personal hygiene.  °· Application of antiseptic washes may help prevent recurrences.  °· A topical antibiotic cream, mupirocin (Bactroban®), has been effective at reducing bacteria in the nostrils. It is applied inside the nose with your little finger. This is done twice daily for a week. Then it is repeated every 6 months.  °· Because follicle disorders tend to come back, patients must receive follow-up care. Your caregiver may be able to recognize a recurrence before it becomes severe.  °SEEK IMMEDIATE MEDICAL CARE IF:  °· You develop redness, swelling, or increasing pain in the area.  °· You have a fever.  °· You are not improving with treatment   or are getting worse.  °· You have any other questions or concerns.  °Document Released: 06/15/2001 Document Revised: 03/26/2011 Document Reviewed: 04/11/2008 °[employer]® Patient Information ©2012 [employer]. °

## 2011-11-05 ENCOUNTER — Telehealth: Payer: Self-pay | Admitting: *Deleted

## 2011-11-05 NOTE — Telephone Encounter (Signed)
CALLED PATIENT TO REMIND OF PROCEDURE FOR 11-06-11, CONFIRMED PROCEDURE WITH PATIENT.

## 2011-11-05 NOTE — H&P (Signed)
History of Present Illness            History of nephrolithiasis: He developed left flank pain and was found by CT scan in 7/11 to have a stone in the distal left ureter. The CT scan revealed no renal calculi present at that time. He eventually passed his left distal ureteral stone.  Elevated PSA: He was found to have a PSA of 6.73 in 4/13. His PSA in 9/11 was 3.76. He reports that he has not had any change in his voiding pattern and specifically has no irritative or obstructive symptoms as a baseline. He has never had prostatitis or UTI and currently denies any dysuria. He's not seen any hematuria denies any unexplained pain or weight loss. There is no family history of prostate cancer. He underwent TRUS/BX on 09/01/11. His prostate was noted to be 21 cc. Pathology: Adenocarcinoma of the prostate Gleason 3+3 = 6 in 5/12 cores primarily from the right lateral lobe where induration was noted = T2a.  Interval history:  No new urologic complaints are noted.  He  developed some significant pain in his right testicle. He ended up going to emergency room where he was told he might have had a torsion. He was given Naprosyn and his pain has now resolved. He had no associated swelling. He has had no further pain since that time.   Past Medical History Problems  1. History of  Diabetes Mellitus 250.00 2. History of  Hypertension 401.9 3. History of  Ureteral Stone Left 592.1  Surgical History Problems  1. History of  Biopsy Of The Prostate Needle 2. History of  No Surgical Problems  Current Meds 1. Amaryl 1 MG Oral Tablet; Therapy: (Recorded:30Apr2013) to 2. Aspirin 81 MG Oral Tablet; Therapy: (Recorded:26Jul2011) to 3. Glimepiride 1 MG Oral Tablet; Therapy: (Recorded:26Jul2011) to 4. Ibuprofen 800 MG Oral Tablet; Therapy: (Recorded:26Jul2011) to 5. Lisinopril 20 MG Oral Tablet; Therapy: (Recorded:30Apr2013) to 6. Lovastatin 20 MG Oral Tablet; Therapy: (Recorded:26Jul2011) to 7. Naproxen 500 MG  Oral Tablet; Therapy: (Recorded:24May2013) to 8. Naproxen 500 MG Oral Tablet; Therapy: 21May2013 to 9. Pennsaid 1.5 % Transdermal Solution; Therapy: (Recorded:30Apr2013) to 10. Pioglitazone HCl 30 MG Oral Tablet; Therapy: 30Apr2013 to 11. Skelaxin 800 MG Oral Tablet; Therapy: (Recorded:26Jul2011) to 12. Vitamin D-3 5000 UNIT Oral Tablet; Therapy: (Recorded:26Jul2011) to 13. Zestril 20 MG Oral Tablet; Therapy: (Recorded:26Jul2011) to 14. Zovirax 5 % External Cream; Therapy: 10May2011 to  Allergies Medication  1. MetFORMIN HCl TABS  Family History Problems  1. Paternal history of  Death In The Family Father 51 2. Family history of  Death In The Family Mother 64 3. Family history of  Family Health Status Number Of Children 1 son 4. Paternal history of  Lung Cancer V16.1 5. Maternal history of  Stroke Syndrome V17.1  Social History Problems  1. Marital History - Currently Married 2. Never A Smoker 3. Occupation: Tree surgeon for [employer]  4. History of  Alcohol Use 5. History of  Caffeine Use 6. History of  Tobacco Use  Review of Systems Genitourinary, constitutional, skin, eye, otolaryngeal, hematologic/lymphatic, cardiovascular, pulmonary, endocrine, musculoskeletal, gastrointestinal, neurological and psychiatric system(s) were reviewed and pertinent findings if present are noted.  Genitourinary: nocturia, but no dysuria and no hematuria.  Constitutional: no recent weight loss.    Vitals Vital Signs  BMI Calculated: 25.39 BSA Calculated: 1.81 Height: 5 ft 6 in Weight: 158 lb  Blood Pressure: 128 / 92 Heart Rate: 63  Physical Exam Constitutional: Well nourished and well  developed . No acute distress.  ENT:. The ears and nose are normal in appearance.  Neck: The appearance of the neck is normal and no neck mass is present.  Pulmonary: No respiratory distress and normal respiratory rhythm and effort.  Cardiovascular: Heart rate and rhythm are normal . No  peripheral edema.  Abdomen: The abdomen is soft and nontender. No masses are palpated. No CVA tenderness. No hernias are palpable. No hepatosplenomegaly noted.  Rectal: Rectal exam demonstrates normal sphincter tone, no tenderness and no masses. The prostate has no nodularity, is indurated (The right lobe seemed firmer laterally) involving the right of the prostate and is not tender. The left seminal vesicle is nonpalpable. The right seminal vesicle is nonpalpable. The perineum is normal on inspection.  Genitourinary: Examination of the penis demonstrates no discharge, no masses, no lesions and a normal meatus. The scrotum is without lesions. The right epididymis is palpably normal and non-tender. The left epididymis is palpably normal and non-tender. The right testis is non-tender and without masses. The left testis is non-tender and without masses.  Lymphatics: The femoral and inguinal nodes are not enlarged or tender.  Skin: Normal skin turgor, no visible rash and no visible skin lesions.  Neuro/Psych:. Mood and affect are appropriate.   Assessment Assessed  1. Adenocarcinoma Of The Prostate Gland 185  Plan Adenocarcinoma Of The Prostate Gland (185)    Discussion/Summary        The patient was counseled about the natural history of prostate cancer and the standard treatment options that are available for prostate cancer. It was explained to him how his age and life expectancy, clinical stage, Gleason score, and PSA affect his prognosis, the decision to proceed with additional staging studies, as well as how that information influences recommended treatment strategies. We discussed the roles for active surveillance, radiation therapy, surgical therapy, androgen deprivation, as well as ablative therapy options for the treatment of prostate cancer as appropriate to his individual cancer situation. We discussed the risks and benefits of these options with regard to their impact on cancer control  and also in terms of potential adverse events, complications, and impact on quiality of life particularly related to urinary, bowel, and sexual function. The patient was encouraged to ask questions throughout the discussion today and all questions were answered to his stated satisfaction. In addition, the patient was provided with and/or directed to appropriate resources and literature for further education about prostate cancer and treatment options.   40 minutes were spent in face to face consultation with patient today.     After discussing all the treatment options he has elected to proceed with brachytherapy. I went over this with him today in further detail including its potential risks and complications etc. He as scheduled for appointment with radiation oncologist and decided to proceed with radioactive seeds for which he is here for today.

## 2011-11-06 ENCOUNTER — Encounter (HOSPITAL_BASED_OUTPATIENT_CLINIC_OR_DEPARTMENT_OTHER): Payer: Self-pay | Admitting: Anesthesiology

## 2011-11-06 ENCOUNTER — Encounter (HOSPITAL_BASED_OUTPATIENT_CLINIC_OR_DEPARTMENT_OTHER): Payer: Self-pay | Admitting: *Deleted

## 2011-11-06 ENCOUNTER — Ambulatory Visit (HOSPITAL_BASED_OUTPATIENT_CLINIC_OR_DEPARTMENT_OTHER)
Admission: RE | Admit: 2011-11-06 | Discharge: 2011-11-06 | Disposition: A | Discharge status: Home / Self Care | Payer: BC Managed Care – PPO | Source: Ambulatory Visit | Attending: Urology | Admitting: Urology

## 2011-11-06 ENCOUNTER — Ambulatory Visit (HOSPITAL_COMMUNITY): Payer: BC Managed Care – PPO

## 2011-11-06 ENCOUNTER — Ambulatory Visit (HOSPITAL_BASED_OUTPATIENT_CLINIC_OR_DEPARTMENT_OTHER): Payer: BC Managed Care – PPO | Admitting: Anesthesiology

## 2011-11-06 ENCOUNTER — Encounter (HOSPITAL_BASED_OUTPATIENT_CLINIC_OR_DEPARTMENT_OTHER)
Admission: RE | Disposition: A | Discharge status: Home / Self Care | Payer: Self-pay | Source: Ambulatory Visit | Attending: Urology

## 2011-11-06 DIAGNOSIS — Z79899 Other long term (current) drug therapy: Secondary | ICD-10-CM | POA: Insufficient documentation

## 2011-11-06 DIAGNOSIS — C61 Malignant neoplasm of prostate: Secondary | ICD-10-CM | POA: Diagnosis present

## 2011-11-06 DIAGNOSIS — E119 Type 2 diabetes mellitus without complications: Secondary | ICD-10-CM | POA: Insufficient documentation

## 2011-11-06 DIAGNOSIS — Z7982 Long term (current) use of aspirin: Secondary | ICD-10-CM | POA: Insufficient documentation

## 2011-11-06 DIAGNOSIS — I10 Essential (primary) hypertension: Secondary | ICD-10-CM | POA: Insufficient documentation

## 2011-11-06 DIAGNOSIS — Z8546 Personal history of malignant neoplasm of prostate: Secondary | ICD-10-CM | POA: Diagnosis present

## 2011-11-06 HISTORY — DX: Other intervertebral disc degeneration, lumbar region: M51.36

## 2011-11-06 HISTORY — DX: Other chronic pain: G89.29

## 2011-11-06 HISTORY — PX: RADIOACTIVE SEED IMPLANT: SHX5150

## 2011-11-06 HISTORY — DX: Other intervertebral disc displacement, lumbar region: M51.26

## 2011-11-06 HISTORY — DX: Personal history of other diseases of the musculoskeletal system and connective tissue: Z87.39

## 2011-11-06 HISTORY — DX: Low back pain: M54.5

## 2011-11-06 HISTORY — DX: Low back pain, unspecified: M54.50

## 2011-11-06 HISTORY — PX: CYSTOSCOPY: SHX5120

## 2011-11-06 HISTORY — DX: Other intervertebral disc degeneration, lumbar region without mention of lumbar back pain or lower extremity pain: M51.369

## 2011-11-06 LAB — GLUCOSE, CAPILLARY: Glucose-Capillary: 88 mg/dL (ref 70–99)

## 2011-11-06 SURGERY — INSERTION, RADIATION SOURCE, PROSTATE
Anesthesia: General | Site: Prostate | Wound class: Clean Contaminated

## 2011-11-06 MED ORDER — CIPROFLOXACIN HCL 500 MG PO TABS: 500.0000 mg | ORAL_TABLET | Freq: Two times a day (BID) | ORAL | Status: AC

## 2011-11-06 MED ORDER — IOHEXOL 350 MG/ML SOLN
INTRAVENOUS | Status: DC | PRN
  Administered 2011-11-06: 7 mL

## 2011-11-06 MED ORDER — PROMETHAZINE HCL 25 MG/ML IJ SOLN: 6.2500 mg | INTRAMUSCULAR | Status: DC | PRN

## 2011-11-06 MED ORDER — FENTANYL CITRATE 0.05 MG/ML IJ SOLN
25.0000 ug | INTRAMUSCULAR | Status: DC | PRN
  Administered 2011-11-06: 25 ug via INTRAVENOUS

## 2011-11-06 MED ORDER — ONDANSETRON HCL 4 MG/2ML IJ SOLN
INTRAMUSCULAR | Status: DC | PRN
  Administered 2011-11-06: 4 mg via INTRAVENOUS

## 2011-11-06 MED ORDER — CIPROFLOXACIN IN D5W 400 MG/200ML IV SOLN
400.0000 mg | INTRAVENOUS | Status: AC
  Administered 2011-11-06: 400 mg via INTRAVENOUS

## 2011-11-06 MED ORDER — HYDROCODONE-ACETAMINOPHEN 10-325 MG PO TABS
1.0000 | ORAL_TABLET | Freq: Four times a day (QID) | ORAL | Status: DC | PRN
  Administered 2011-11-06: 1 via ORAL

## 2011-11-06 MED ORDER — STERILE WATER FOR IRRIGATION IR SOLN
Status: DC | PRN
  Administered 2011-11-06: 3000 mL

## 2011-11-06 MED ORDER — MIDAZOLAM HCL 5 MG/5ML IJ SOLN
INTRAMUSCULAR | Status: DC | PRN
  Administered 2011-11-06: 2 mg via INTRAVENOUS

## 2011-11-06 MED ORDER — LACTATED RINGERS IV SOLN: INTRAVENOUS | Status: DC

## 2011-11-06 MED ORDER — EPHEDRINE SULFATE 50 MG/ML IJ SOLN
INTRAMUSCULAR | Status: DC | PRN
  Administered 2011-11-06: 10 mg via INTRAVENOUS

## 2011-11-06 MED ORDER — FENTANYL CITRATE 0.05 MG/ML IJ SOLN
INTRAMUSCULAR | Status: DC | PRN
  Administered 2011-11-06 (×3): 25 ug via INTRAVENOUS
  Administered 2011-11-06: 100 ug via INTRAVENOUS
  Administered 2011-11-06: 25 ug via INTRAVENOUS

## 2011-11-06 MED ORDER — LACTATED RINGERS IV SOLN
INTRAVENOUS | Status: DC
  Administered 2011-11-06 (×2): via INTRAVENOUS

## 2011-11-06 MED ORDER — FLEET ENEMA 7-19 GM/118ML RE ENEM: 1.0000 | ENEMA | Freq: Once | RECTAL | Status: DC

## 2011-11-06 MED ORDER — PROPOFOL 10 MG/ML IV EMUL
INTRAVENOUS | Status: DC | PRN
  Administered 2011-11-06: 100 mg via INTRAVENOUS

## 2011-11-06 MED ORDER — LIDOCAINE HCL (CARDIAC) 20 MG/ML IV SOLN
INTRAVENOUS | Status: DC | PRN
  Administered 2011-11-06: 60 mg via INTRAVENOUS

## 2011-11-06 MED ORDER — HYDROCODONE-ACETAMINOPHEN 10-325 MG PO TABS: 1.0000 | ORAL_TABLET | Freq: Four times a day (QID) | ORAL | Status: AC | PRN

## 2011-11-06 MED ORDER — DEXAMETHASONE SODIUM PHOSPHATE 4 MG/ML IJ SOLN
INTRAMUSCULAR | Status: DC | PRN
  Administered 2011-11-06: 8 mg via INTRAVENOUS

## 2011-11-06 MED ORDER — STERILE WATER FOR IRRIGATION IR SOLN
Status: DC | PRN
  Administered 2011-11-06: 3 mL

## 2011-11-06 MED ORDER — GLYCOPYRROLATE 0.2 MG/ML IJ SOLN
INTRAMUSCULAR | Status: DC | PRN
  Administered 2011-11-06: 0.2 mg via INTRAVENOUS

## 2011-11-06 SURGICAL SUPPLY — 29 items
BAG URINE DRAINAGE (UROLOGICAL SUPPLIES) ×3 IMPLANT
BLADE SURG ROTATE 9660 (MISCELLANEOUS) ×3 IMPLANT
CATH FOLEY 2WAY SLVR  5CC 16FR (CATHETERS) ×2
CATH FOLEY 2WAY SLVR 5CC 16FR (CATHETERS) ×4 IMPLANT
CATH ROBINSON RED A/P 20FR (CATHETERS) ×3 IMPLANT
CLOTH BEACON ORANGE TIMEOUT ST (SAFETY) ×3 IMPLANT
COVER MAYO STAND STRL (DRAPES) ×3 IMPLANT
COVER TABLE BACK 60X90 (DRAPES) ×3 IMPLANT
DRSG TEGADERM 4X4.75 (GAUZE/BANDAGES/DRESSINGS) ×3 IMPLANT
DRSG TEGADERM 8X12 (GAUZE/BANDAGES/DRESSINGS) ×3 IMPLANT
GLOVE BIO SURGEON STRL SZ 6.5 (GLOVE) ×3 IMPLANT
GLOVE BIO SURGEON STRL SZ7.5 (GLOVE) ×3 IMPLANT
GLOVE BIO SURGEON STRL SZ8 (GLOVE) ×6 IMPLANT
GLOVE ECLIPSE 6.0 STRL STRAW (GLOVE) ×3 IMPLANT
GLOVE ECLIPSE 8.0 STRL XLNG CF (GLOVE) ×12 IMPLANT
GOWN STRL REIN XL XLG (GOWN DISPOSABLE) ×3 IMPLANT
GOWN SURGICAL LARGE (GOWNS) ×3 IMPLANT
GOWN XL W/COTTON TOWEL STD (GOWNS) ×3 IMPLANT
HOLDER FOLEY CATH W/STRAP (MISCELLANEOUS) ×3 IMPLANT
IV NS IRRIG 3000ML ARTHROMATIC (IV SOLUTION) IMPLANT
IV WATER IRR. 1000ML (IV SOLUTION) ×3 IMPLANT
NUCLETRON SELECTSEED ×3 IMPLANT
PACK CYSTOSCOPY (CUSTOM PROCEDURE TRAY) ×3 IMPLANT
SPONGE GAUZE 4X4 12PLY (GAUZE/BANDAGES/DRESSINGS) ×3 IMPLANT
SYRINGE 10CC LL (SYRINGE) ×3 IMPLANT
TOWEL NATURAL 6PK STERILE (DISPOSABLE) ×6 IMPLANT
UNDERPAD 30X30 INCONTINENT (UNDERPADS AND DIAPERS) ×6 IMPLANT
WATER STERILE IRR 3000ML UROMA (IV SOLUTION) IMPLANT
WATER STERILE IRR 500ML POUR (IV SOLUTION) ×3 IMPLANT

## 2011-11-06 NOTE — Op Note (Signed)
PATIENT:  Jacob Chapman  PRE-OPERATIVE DIAGNOSIS:  Adenocarcinoma of the prostate  POST-OPERATIVE DIAGNOSIS:  Same  PROCEDURE:  Procedure(s): 1. I-125 radioactive seed implantation 2. Cystoscopy  SURGEON:  Surgeon(s): Garnett Farm  Radiation oncologist: Dr. Margaretmary Dys  ANESTHESIA:  General  EBL:  Minimal  DRAINS: 16 French Foley catheter  INDICATION: Jacob Chapman is a 66 year old male who was diagnosed with adenocarcinoma of the prostate. He initially presented with a PSA of 6.73. He was found to have nodularity of his prostate and his biopsy proved positive for 5 cores out of 12 containing Gleason 3+3 = 6 adenocarcinoma. His clinical stage was T2a.  Description of procedure: After informed consent the patient was brought to the major OR, placed on the table and administered general anesthesia. He was then moved to the modified lithotomy position with his perineum perpendicular to the floor. His perineum and genitalia were then sterilely prepped. An official timeout was then performed. A 16 French Foley catheter was then placed in the bladder and filled with dilute contrast, a rectal tube was placed in the rectum and the transrectal ultrasound probe was placed in the rectum and affixed to the stand. He was then sterilely draped.  Real time ultrasonography was used along with the seed planning software spot-pro version 3.1-00. This was used to develop the seed plan including the number of needles as well as number of seeds required for complete and adequate coverage. Real-time ultrasonography was then used along with the previously developed plan and the Nucletron device to implant a total of 85 seeds using 23 needles. This proceeded without difficulty or complication.  A Foley catheter was then removed as well as the transrectal ultrasound probe and rectal probe. Flexible cystoscopy was then performed using the 17 French flexible scope which revealed a normal urethra throughout  its length down to the sphincter which appeared intact. The prostatic urethra revealed bilobar hypertrophy but no evidence of obstruction, seeds, spacers or lesions. The bladder was then entered and fully and systematically inspected. The ureteral orifices were noted to be of normal configuration and position. The mucosa revealed no evidence of tumors. There were also no stones identified within the bladder. I noted no seeds or spacers on the floor of the bladder and retroflexion of the scope revealed no seeds protruding from the base of the prostate.  The cystoscope was then removed and a new 16 French Foley catheter was then inserted and the balloon was filled with 10 cc of sterile water. This was connected to closed system drainage and the patient was awakened and taken to recovery room in stable and satisfactory condition. He tolerated procedure well and there were no intraoperative complications.

## 2011-11-06 NOTE — Progress Notes (Signed)
Dr Vernie Ammons confirmed that pt continue taking all meds pt was taking prior to surgery to include ASA .

## 2011-11-06 NOTE — Anesthesia Preprocedure Evaluation (Signed)
Anesthesia Evaluation  Patient identified by MRN, date of birth, ID band Patient awake    Reviewed: Allergy & Precautions, H&P , NPO status , Patient's Chart, lab work & pertinent test results  Airway Mallampati: II TM Distance: >3 FB Neck ROM: Full    Dental  (+) Teeth Intact   Pulmonary neg pulmonary ROS,  breath sounds clear to auscultation  Pulmonary exam normal       Cardiovascular hypertension, Pt. on medications Rhythm:Regular Rate:Normal     Neuro/Psych negative neurological ROS  negative psych ROS   GI/Hepatic negative GI ROS, Neg liver ROS,   Endo/Other  Type 2, Oral Hypoglycemic Agents  Renal/GU negative Renal ROS  negative genitourinary   Musculoskeletal negative musculoskeletal ROS (+)   Abdominal   Peds  Hematology negative hematology ROS (+)   Anesthesia Other Findings   Reproductive/Obstetrics negative OB ROS                           Anesthesia Physical Anesthesia Plan  ASA: II  Anesthesia Plan: General   Post-op Pain Management:    Induction: Intravenous  Airway Management Planned: LMA  Additional Equipment:   Intra-op Plan:   Post-operative Plan: Extubation in OR  Informed Consent: I have reviewed the patients History and Physical, chart, labs and discussed the procedure including the risks, benefits and alternatives for the proposed anesthesia with the patient or authorized representative who has indicated his/her understanding and acceptance.   Dental advisory given  Plan Discussed with: CRNA  Anesthesia Plan Comments:         Anesthesia Quick Evaluation

## 2011-11-06 NOTE — Transfer of Care (Signed)
Immediate Anesthesia Transfer of Care Note  Patient: Jacob Chapman  Procedure(s) Performed: Procedure(s) (LRB): RADIOACTIVE SEED IMPLANT (Bilateral) CYSTOSCOPY FLEXIBLE (N/A)  Patient Location: PACU  Anesthesia Type: General  Level of Consciousness: awake, oriented, sedated and patient cooperative  Airway & Oxygen Therapy: Patient Spontanous Breathing and Patient connected to face mask oxygen  Post-op Assessment: Report given to PACU RN and Post -op Vital signs reviewed and stable  Post vital signs: Reviewed and stable  Complications: No apparent anesthesia complications

## 2011-11-06 NOTE — Interval H&P Note (Signed)
History and Physical Interval Note:  11/06/2011 7:15 AM  Jacob Chapman  has presented today for surgery, with the diagnosis of prostate ca  The various methods of treatment have been discussed with the patient and family. After consideration of risks, benefits and other options for treatment, the patient has consented to  Procedure(s) (LRB): RADIOACTIVE SEED IMPLANT (Bilateral) as a surgical intervention .  The patient's history has been reviewed, patient examined, no change in status, stable for surgery.  I have reviewed the patient's chart and labs.  Questions were answered to the patient's satisfaction.     Garnett Farm

## 2011-11-07 NOTE — Anesthesia Postprocedure Evaluation (Signed)
Anesthesia Post Note  Patient: Jacob Chapman  Procedure(s) Performed: Procedure(s) (LRB): RADIOACTIVE SEED IMPLANT (Bilateral) CYSTOSCOPY FLEXIBLE (N/A)  Anesthesia type: General  Patient location: PACU  Post pain: Pain level controlled  Post assessment: Post-op Vital signs reviewed  Last Vitals:  Filed Vitals:   11/06/11 1143  BP: 122/73  Pulse: 49  Temp: 36 C  Resp: 20    Post vital signs: Reviewed  Level of consciousness: sedated  Complications: No apparent anesthesia complications

## 2011-11-09 ENCOUNTER — Ambulatory Visit (INDEPENDENT_AMBULATORY_CARE_PROVIDER_SITE_OTHER): Payer: BC Managed Care – PPO | Admitting: Internal Medicine

## 2011-11-09 ENCOUNTER — Encounter (HOSPITAL_BASED_OUTPATIENT_CLINIC_OR_DEPARTMENT_OTHER): Payer: Self-pay | Admitting: Urology

## 2011-11-09 VITALS — BP 142/80 | HR 80 | Temp 97.3°F | Resp 16 | Wt 166.0 lb

## 2011-11-09 DIAGNOSIS — C61 Malignant neoplasm of prostate: Secondary | ICD-10-CM

## 2011-11-09 DIAGNOSIS — E785 Hyperlipidemia, unspecified: Secondary | ICD-10-CM

## 2011-11-09 DIAGNOSIS — R220 Localized swelling, mass and lump, head: Secondary | ICD-10-CM

## 2011-11-09 DIAGNOSIS — R221 Localized swelling, mass and lump, neck: Secondary | ICD-10-CM

## 2011-11-09 DIAGNOSIS — M109 Gout, unspecified: Secondary | ICD-10-CM

## 2011-11-09 DIAGNOSIS — L738 Other specified follicular disorders: Secondary | ICD-10-CM

## 2011-11-09 DIAGNOSIS — E119 Type 2 diabetes mellitus without complications: Secondary | ICD-10-CM

## 2011-11-09 DIAGNOSIS — IMO0002 Reserved for concepts with insufficient information to code with codable children: Secondary | ICD-10-CM | POA: Insufficient documentation

## 2011-11-09 DIAGNOSIS — L739 Follicular disorder, unspecified: Secondary | ICD-10-CM

## 2011-11-09 DIAGNOSIS — I10 Essential (primary) hypertension: Secondary | ICD-10-CM

## 2011-11-09 DIAGNOSIS — L0211 Cutaneous abscess of neck: Secondary | ICD-10-CM

## 2011-11-09 LAB — GLUCOSE, CAPILLARY: Glucose-Capillary: 108 mg/dL — ABNORMAL HIGH (ref 70–99)

## 2011-11-09 MED ORDER — MUPIROCIN 2 % EX OINT: TOPICAL_OINTMENT | CUTANEOUS | Status: AC

## 2011-11-09 MED ORDER — IBUPROFEN 800 MG PO TABS: 800.0000 mg | ORAL_TABLET | Freq: Three times a day (TID) | ORAL | Status: DC | PRN

## 2011-11-09 MED ORDER — GLIMEPIRIDE 1 MG PO TABS: 0.5000 mg | ORAL_TABLET | Freq: Two times a day (BID) | ORAL | Status: DC

## 2011-11-09 MED ORDER — DOXYCYCLINE HYCLATE 100 MG PO CAPS: 100.0000 mg | ORAL_CAPSULE | Freq: Two times a day (BID) | ORAL | Status: AC

## 2011-11-09 MED ORDER — PIOGLITAZONE HCL 30 MG PO TABS: 30.0000 mg | ORAL_TABLET | Freq: Every day | ORAL | Status: DC

## 2011-11-09 NOTE — Assessment & Plan Note (Signed)
Continue with current prescription therapy as reflected on the Med list.  

## 2011-11-09 NOTE — Patient Instructions (Addendum)

## 2011-11-09 NOTE — Assessment & Plan Note (Signed)
7/13 R neck Will do I&D Doxy and Mupirocin

## 2011-11-09 NOTE — Op Note (Addendum)
  Radiation Oncology         ([phone]) [phone] ________________________________  Name: Jacob Chapman MRN: [id number]  Date: 11/06/2011  DOB: [birth year]       Prostate Seed Implant  NW:GNFA Plotnikov, MD    DIAGNOSIS: A 66 year old gentlemen with stage T2a adenocarcinoma of the prostate with a Gleason of 3+3 and a PSA of 6.73.  PROCEDURE: Insertion of radioactive I-125 seeds into the prostate gland.  RADIATION DOSE: 145 Gy, definitive therapy.  TECHNIQUE: Jacob Chapman was brought to the operating room with the urologist. He was placed in the dorsolithotomy position. He was catheterized and a rectal tube was inserted. The perineum was shaved, prepped and draped. The ultrasound probe was then introduced into the rectum to see the prostate gland.  TREATMENT DEVICE: A needle grid was attached to the ultrasound probe stand and anchor needles were placed.  COMPLEX ISODOSE CALCULATION: The prostate was imaged in 3D using a sagittal sweep of the prostate probe. These images were transferred to the planning computer. There, the prostate, urethra and rectum were defined on each axial reconstructed image. Then, the software created an optimized plan and a few seed positions were adjusted. Then the accepted plan was uploaded to the seed Selectron afterloading unit.  SPECIAL TREATMENT PROCEDURE/SUPERVISION AND HANDLING: The Nucletron FIRST system was used to place the needles under sagittal guidance. A total of 21 needles were used to deposit 85 seeds in the prostate gland. The individual seed activity was 0.389 mCi for a total implant activity of 33.065 mCi.  COMPLEX SIMULATION: At the end of the procedure, an anterior radiograph of the pelvis was obtained to document seed positioning and count. Cystoscopy was performed to check the urethra and bladder.  MICRODOSIMETRY: At the end of the procedure, the patient was emitting 0.2 mrem/hr at 1 meter. Accordingly, he was considered safe for hospital  discharge.  PLAN: The patient will return to the radiation oncology clinic for post implant CT dosimetry in three weeks.   ________________________________  Artist Pais Kathrynn Running, M.D.

## 2011-11-09 NOTE — Assessment & Plan Note (Signed)
7/13  Will do [place] t/o confirm abscess

## 2011-11-09 NOTE — Progress Notes (Signed)
Patient ID: Jacob Chapman, male   DOB: Sep 08, 1945, 66 y.o.   MRN: [id number]  Subjective:    Patient ID: Jacob Chapman, male    DOB: 10-06-1945, 66 y.o.   MRN: [id number]  HPI The patient presents for a follow-up of  chronic hypertension, chronic dyslipidemia, type 2 diabetes and gout controlled with medicines; f/u on prosate Ca - XRT seeds last Fri C/o R neck infection since last week -- on Doxy  BP Readings from Last 3 Encounters:  11/09/11 142/80  11/06/11 122/73  11/06/11 122/73   Wt Readings from Last 3 Encounters:  11/09/11 166 lb (75.297 kg)  10/30/11 158 lb (71.668 kg)  10/30/11 158 lb (71.668 kg)       Review of Systems  Constitutional: Negative for appetite change, fatigue and unexpected weight change.  HENT: Negative for nosebleeds, congestion, sore throat, sneezing, trouble swallowing and neck pain.   Eyes: Negative for itching and visual disturbance.  Respiratory: Negative for cough.   Cardiovascular: Negative for chest pain, palpitations and leg swelling.  Gastrointestinal: Negative for nausea, diarrhea, blood in stool and abdominal distention.  Genitourinary: Negative for frequency and hematuria.  Musculoskeletal: Negative for back pain, joint swelling and gait problem.  Skin: Negative for rash.  Neurological: Negative for dizziness, tremors, speech difficulty and weakness.  Psychiatric/Behavioral: Negative for disturbed wake/sleep cycle, dysphoric mood and agitation. The patient is not nervous/anxious.        Objective:   Physical Exam  Constitutional: He is oriented to person, place, and time. He appears well-developed.  HENT:  Mouth/Throat: Oropharynx is clear and moist.  Eyes: Conjunctivae are normal. Pupils are equal, round, and reactive to light.  Neck: Normal range of motion. No JVD present. No thyromegaly present.  Cardiovascular: Normal rate, regular rhythm, normal heart sounds and intact distal pulses.  Exam reveals no gallop and no friction rub.    No murmur heard. Pulmonary/Chest: Effort normal and breath sounds normal. No respiratory distress. He has no wheezes. He has no rales. He exhibits no tenderness.  Abdominal: Soft. Bowel sounds are normal. He exhibits no distension and no mass. There is no tenderness. There is no rebound and no guarding.  Musculoskeletal: Normal range of motion. He exhibits no edema and no tenderness.  Lymphadenopathy:    He has no cervical adenopathy.  Neurological: He is alert and oriented to person, place, and time. He has normal reflexes. No cranial nerve deficit. He exhibits normal muscle tone. Coordination normal.  Skin: Skin is warm and dry. No rash noted.  Psychiatric: He has a normal mood and affect. His behavior is normal. Judgment and thought content normal.  R dist neck subcutaneous tender nodule with a scab  Procedure Note :    Procedure :   Point of care (POC) sonography examination   Indication: R neck node; ?need for I&D   Equipment used: Sonosite M-Turbo with HFL38x/13-6 MHz transducer linear probe. The images were stored in the unit and later transferred in storage.  The patient was placed in a decubitus position.  This study revealed a hypoechoic   < 1cm lesion in the nodule   Impression: findings are c/w  An abscess  Procedure note:  Incision and Drainage of an Abscess   Indication : a localized collection of pus that is tender and not spontaneously resolving.    Risks including unsuccessful procedure , possible need for a repeat procedure due to pus accumulation, scar formation, and others as well as benefits were explained to  the patient in detail. Written consent was obtained/signed.    The patient was placed in a decubitus position. The area of an abscess was prepped with povidone-iodine and draped in a sterile fashion. Local anesthesia with 1 cc of 2% lidocaine and epinephrine  was administered.  1 cm incision with #11strait blade was made. About 1 cc of purulent  material was expressed. The abscess cavity was explored with a sterile hemostat and the walled- off pockets and septae were broken down bluntly. The cavity was irrigated with the rest of the anesthetic in the syringe and packed with 1 inches of  the iodoform gauze.   The wound was dressed with antibiotic ointment and Telfa pad.  Tolerated well. Complications: None.   Wound instructions provided.       .    Lab Results  Component Value Date   WBC 6.0 10/30/2011   HGB 15.3 10/30/2011   HCT 45.0 10/30/2011   PLT 180 10/30/2011   CHOL 159 04/07/2011   TRIG 124.0 04/07/2011   HDL 40.40 04/07/2011   ALT 20 10/30/2011   AST 18 10/30/2011   NA 140 10/30/2011   K 4.8 10/30/2011   CL 106 10/30/2011   CREATININE 0.90 10/30/2011   BUN 12 10/30/2011   CO2 27 10/30/2011   TSH 1.63 04/07/2011   PSA 6.73* 07/20/2011   INR 0.92 10/30/2011   HGBA1C 7.1* 10/19/2011       Assessment & Plan:

## 2011-11-09 NOTE — Assessment & Plan Note (Signed)
dx Adenocarcinoma 2013 S/p XRT seeds 11/06/11 Doing well.Marland KitchenMarland Kitchen

## 2011-11-11 NOTE — Addendum Note (Signed)
Addendum  created 11/11/11 1008 by Francie Massing, CRNA   Modules edited:Anesthesia Blocks and Procedures, Inpatient Notes

## 2011-11-11 NOTE — Anesthesia Procedure Notes (Signed)
Procedure Name: LMA Insertion Date/Time: 11/11/2011 7:52 AM Performed by: Renella Cunas D Pre-anesthesia Checklist: Patient identified, Emergency Drugs available, Suction available and Patient being monitored Patient Re-evaluated:Patient Re-evaluated prior to inductionOxygen Delivery Method: Circle System Utilized Preoxygenation: Pre-oxygenation with 100% oxygen Intubation Type: IV induction Ventilation: Mask ventilation without difficulty LMA: LMA inserted LMA Size: 4.0 Number of attempts: 1 Airway Equipment and Method: bite block Placement Confirmation: positive ETCO2 Tube secured with: Tape Dental Injury: Teeth and Oropharynx as per pre-operative assessment

## 2011-11-11 NOTE — Addendum Note (Signed)
Addendum  created 11/11/11 1009 by Francie Massing, CRNA   Modules edited:Anesthesia Blocks and Procedures, Inpatient Notes

## 2011-11-17 ENCOUNTER — Telehealth: Payer: Self-pay | Admitting: *Deleted

## 2011-11-17 NOTE — Telephone Encounter (Signed)
Rec fax requesting to change lisinopril 20 mg 2 qd to $0 mg 1 qd # 90. Ok?

## 2011-11-19 NOTE — Telephone Encounter (Signed)
Sorry! The request is to change from 20 mg 2 qd   to 40 mg qd # 90.

## 2011-11-19 NOTE — Telephone Encounter (Signed)
Ok Thx 

## 2011-11-20 ENCOUNTER — Telehealth: Payer: Self-pay | Admitting: *Deleted

## 2011-11-20 NOTE — Telephone Encounter (Signed)
I called [facility] and spoke to a rep who advised me that someone already called and the issue has been resolved. Closing phone note.

## 2011-11-20 NOTE — Telephone Encounter (Signed)
CALLED PATIENT TO REMIND OF APPTS. FOR 12-04-11, LVM FOR A RETURN CALL

## 2011-12-04 ENCOUNTER — Ambulatory Visit
Admission: RE | Admit: 2011-12-04 | Discharge: 2011-12-04 | Disposition: A | Discharge status: Home / Self Care | Payer: BC Managed Care – PPO | Source: Ambulatory Visit | Attending: Radiation Oncology | Admitting: Radiation Oncology

## 2011-12-04 ENCOUNTER — Encounter: Payer: Self-pay | Admitting: Radiation Oncology

## 2011-12-04 VITALS — BP 141/90 | HR 62 | Temp 96.7°F | Wt 162.9 lb

## 2011-12-04 DIAGNOSIS — C61 Malignant neoplasm of prostate: Secondary | ICD-10-CM

## 2011-12-04 NOTE — Progress Notes (Signed)
Radiation Oncology         [phone] ________________________________  Name: Jacob Chapman MRN: [id number]  Date: 12/04/2011  DOB: Sep 13, 1945  Follow-Up Visit Note  CC: Sonda Primes, MD  Garnett Farm, MD  Diagnosis:   66 year old gentleman with stage T2a adenocarcinoma prostate Gleason score 3+3 PSA of 6.73  Interval Since Last Radiation:  1 months  Narrative:  The patient returns today for routine follow-up.  He is complaining of increased urinary frequency and urinary hesitation symptoms. He filled out a questionnaire regarding urinary function today providing and overall IPSS score of 4 characterizing his symptoms as mild.  His pre-implant score was 2. He denies any bowel symptoms.  ALLERGIES:  is allergic to metformin.  Meds: Current Outpatient Prescriptions  Medication Sig Dispense Refill  . aspirin 81 MG EC tablet Take 81 mg by mouth daily.       . Cholecalciferol (VITAMIN D3) 1000 UNITS tablet Take 1,000 Units by mouth daily.       Marland Kitchen glimepiride (AMARYL) 1 MG tablet Take 0.5-1 tablets (0.5-1 mg total) by mouth 2 (two) times daily. 1mg  every morning and 0.5mg  at dinner  135 tablet  3  . ibuprofen (ADVIL,MOTRIN) 800 MG tablet Take 1 tablet (800 mg total) by mouth 3 (three) times daily as needed. For pain  90 tablet  3  . lisinopril (PRINIVIL,ZESTRIL) 20 MG tablet Take 1 tablet (20 mg total) by mouth 2 (two) times daily.  180 tablet  1  . lovastatin (MEVACOR) 20 MG tablet TAKE 1 TABLET AT BEDTIME  90 tablet  3  . metaxalone (SKELAXIN) 800 MG tablet Take 800 mg by mouth 3 (three) times daily as needed.       . pioglitazone (ACTOS) 30 MG tablet Take 1 tablet (30 mg total) by mouth daily.  90 tablet  3  . vitamin B-12 (CYANOCOBALAMIN) 500 MCG tablet Take 500 mcg by mouth daily.         Physical Findings: The patient is in no acute distress. Patient is alert and oriented.  weight is 162 lb 14.4 oz (73.891 kg). His temperature is 96.7 F (35.9 C). His blood pressure is  141/90 and his pulse is 62. .  No significant changes.  Lab Findings: Lab Results  Component Value Date   WBC 6.0 10/30/2011   HGB 15.3 10/30/2011   HCT 45.0 10/30/2011   MCV 92.6 10/30/2011   PLT 180 10/30/2011    Radiographic Findings:  Patient underwent CT imaging in our clinic for post implant dosimetry. The CT appears to demonstrate an adequate distribution of radioactive seeds throughout the prostate gland. There no seeds in her near the rectum. I suspect the final radiation plan and dosimetry will show appropriate coverage of the prostate gland.   Impression: The patient is recovering from the effects of radiation. His urinary symptoms should gradually improve over the next 4-6 months. We talked about this today. He is encouraged by his improvement already and is otherwise please with his outcome.   Plan: Today, I spent time talking to the patient about his prostate seed implant and resolving urinary symptoms. Which for long-term followup for prostate cancer following seed implant. He understands that ongoing PSA determinations and digital rectal exams will help perform surveillance to rule out disease recurrence. He understands what to expect with his PSA measures. Patient was also educated today about some of the long-term effects would radiation including the Small risk for rectal bleeding and possibly erectile dysfunction. Talked  about some of the general management approaches to these potential complications. However, I did encourage the patient to contact her office or return at any point if he has questions or concerns related to his previous radiation and prostate cancer.   _____________________________________  Artist Pais. Kathrynn Running, M.D.

## 2011-12-04 NOTE — Progress Notes (Signed)
  Radiation Oncology         [phone]) [phone] ________________________________  Name: Jacob Chapman MRN: [id number]  Date: 12/04/2011  DOB: Apr 18, 1946  COMPLEX SIMULATION NOTE  NARRATIVE:  The patient was brought to the CT Simulation planning suite today following prostate seed implantation approximately one month ago.  Identity was confirmed.  All relevant records and images related to the planned course of therapy were reviewed.  Then, the patient was set-up supine.  CT images were obtained.  The CT images were loaded into the planning software.  Then the prostate and rectum were contoured.  Treatment planning then occurred.  The implanted iodine 125 seeds were identified by the physics staff for projection of radiation distribution  I have requested : 3D Simulation  I have requested a DVH of the following structures: Prostate and rectum.    ________________________________  Artist Pais Kathrynn Running, M.D.

## 2011-12-04 NOTE — Progress Notes (Signed)
Here for follow up post prostate seed implant on 11/06/2011.Patient is doing well. Has mild increase in frequency and urgency and nocturia x 1, no visible blood in urine.Bowels normal. IPSS up from 2 to 4 now.

## 2012-01-12 ENCOUNTER — Encounter: Payer: Self-pay | Admitting: Radiation Oncology

## 2012-01-12 ENCOUNTER — Ambulatory Visit
Admission: RE | Admit: 2012-01-12 | Discharge: 2012-01-12 | Disposition: A | Discharge status: Home / Self Care | Payer: BC Managed Care – PPO | Source: Ambulatory Visit | Attending: Radiation Oncology | Admitting: Radiation Oncology

## 2012-01-12 DIAGNOSIS — C61 Malignant neoplasm of prostate: Secondary | ICD-10-CM | POA: Insufficient documentation

## 2012-01-24 NOTE — Progress Notes (Signed)
  Radiation Oncology         [phone] ________________________________  Name: KEANE MARTELLI MRN: [id number]  Date: 01/12/2012  DOB: October 28, 1945  3-D Planning Note Prostate Brachytherapy  Diagnosis: 66 year old gentleman with stage T2a adenocarcinoma prostate Gleason score 3+3 PSA of 6.73  Narrative: Santiago Bumpers Dipasquale returned following prostate seed implantation for post implant planning. He underwent CT scan to delineate the three-dimensional structures of the pelvis and demonstrate the radiation distribution.  Results:   Prostate Coverage - The dose of radiation delivered to the 90% or more of the prostate gland (D90) was 121.61% of the prescription dose. This exceeds our goal of greater than 90%. Rectal Sparing - The volume of rectal tissue receiving the prescription dose or higher was 0.05 cc. This falls under our thresholds tolerance of 1.0 cc.  Impression: The prostate seed implant appears to show adequate target coverage and appropriate rectal sparing.  Plan:  The patient will continue to follow with urology for ongoing PSA determinations. I would anticipate a high likelihood for local tumor control with minimal risk for rectal morbidity.   Artist Pais Kathrynn Running, M.D.

## 2012-02-19 ENCOUNTER — Other Ambulatory Visit: Payer: Self-pay | Admitting: Internal Medicine

## 2012-02-23 ENCOUNTER — Other Ambulatory Visit (INDEPENDENT_AMBULATORY_CARE_PROVIDER_SITE_OTHER): Payer: BC Managed Care – PPO

## 2012-02-23 DIAGNOSIS — I10 Essential (primary) hypertension: Secondary | ICD-10-CM

## 2012-02-23 DIAGNOSIS — M109 Gout, unspecified: Secondary | ICD-10-CM

## 2012-02-23 DIAGNOSIS — E785 Hyperlipidemia, unspecified: Secondary | ICD-10-CM

## 2012-02-23 DIAGNOSIS — E119 Type 2 diabetes mellitus without complications: Secondary | ICD-10-CM

## 2012-02-23 LAB — BASIC METABOLIC PANEL
BUN: 14 mg/dL (ref 6–23)
Calcium: 9 mg/dL (ref 8.4–10.5)
GFR: 79.94 mL/min (ref >60.00)
Glucose, Bld: 117 mg/dL — ABNORMAL HIGH (ref 70–99)
Potassium: 5 mEq/L (ref 3.5–5.1)

## 2012-02-23 LAB — HEMOGLOBIN A1C: Hgb A1c MFr Bld: 6.6 % — ABNORMAL HIGH (ref 4.6–6.5)

## 2012-03-14 ENCOUNTER — Ambulatory Visit (INDEPENDENT_AMBULATORY_CARE_PROVIDER_SITE_OTHER): Payer: BC Managed Care – PPO | Admitting: Internal Medicine

## 2012-03-14 ENCOUNTER — Encounter: Payer: Self-pay | Admitting: Internal Medicine

## 2012-03-14 VITALS — BP 128/90 | HR 80 | Temp 97.0°F | Resp 16 | Wt 162.0 lb

## 2012-03-14 DIAGNOSIS — E119 Type 2 diabetes mellitus without complications: Secondary | ICD-10-CM

## 2012-03-14 DIAGNOSIS — I10 Essential (primary) hypertension: Secondary | ICD-10-CM

## 2012-03-14 DIAGNOSIS — C61 Malignant neoplasm of prostate: Secondary | ICD-10-CM

## 2012-03-14 NOTE — Progress Notes (Signed)
   Subjective:    Patient ID: Jacob Chapman, male    DOB: [birth year], 66 y.o.   MRN: [id number]  HPI The patient presents for a follow-up of  chronic hypertension, chronic dyslipidemia, type 2 diabetes and gout controlled with medicines; f/u on prosate Ca - XRT seeds 4 mo ago   BP Readings from Last 3 Encounters:  03/14/12 128/90  12/04/11 141/90  11/09/11 142/80   Wt Readings from Last 3 Encounters:  03/14/12 162 lb (73.483 kg)  12/04/11 162 lb 14.4 oz (73.891 kg)  11/09/11 166 lb (75.297 kg)       Review of Systems  Constitutional: Negative for appetite change, fatigue and unexpected weight change.  HENT: Negative for nosebleeds, congestion, sore throat, sneezing, trouble swallowing and neck pain.   Eyes: Negative for itching and visual disturbance.  Respiratory: Negative for cough.   Cardiovascular: Negative for chest pain, palpitations and leg swelling.  Gastrointestinal: Negative for nausea, diarrhea, blood in stool and abdominal distention.  Genitourinary: Negative for frequency and hematuria.  Musculoskeletal: Negative for back pain, joint swelling and gait problem.  Skin: Negative for rash.  Neurological: Negative for dizziness, tremors, speech difficulty and weakness.  Psychiatric/Behavioral: Negative for sleep disturbance, dysphoric mood and agitation. The patient is not nervous/anxious.        Objective:   Physical Exam  Constitutional: He is oriented to person, place, and time. He appears well-developed.  HENT:  Mouth/Throat: Oropharynx is clear and moist.  Eyes: Conjunctivae normal are normal. Pupils are equal, round, and reactive to light.  Neck: Normal range of motion. No JVD present. No thyromegaly present.  Cardiovascular: Normal rate, regular rhythm, normal heart sounds and intact distal pulses.  Exam reveals no gallop and no friction rub.   No murmur heard. Pulmonary/Chest: Effort normal and breath sounds normal. No respiratory distress. He has no  wheezes. He has no rales. He exhibits no tenderness.  Abdominal: Soft. Bowel sounds are normal. He exhibits no distension and no mass. There is no tenderness. There is no rebound and no guarding.  Musculoskeletal: Normal range of motion. He exhibits no edema and no tenderness.  Lymphadenopathy:    He has no cervical adenopathy.  Neurological: He is alert and oriented to person, place, and time. He has normal reflexes. No cranial nerve deficit. He exhibits normal muscle tone. Coordination normal.  Skin: Skin is warm and dry. No rash noted.  Psychiatric: He has a normal mood and affect. His behavior is normal. Judgment and thought content normal.     Lab Results  Component Value Date   WBC 6.0 10/30/2011   HGB 15.3 10/30/2011   HCT 45.0 10/30/2011   PLT 180 10/30/2011   CHOL 159 04/07/2011   TRIG 124.0 04/07/2011   HDL 40.40 04/07/2011   ALT 20 10/30/2011   AST 18 10/30/2011   NA 140 02/23/2012   K 5.0 02/23/2012   CL 105 02/23/2012   CREATININE 1.0 02/23/2012   BUN 14 02/23/2012   CO2 27 02/23/2012   TSH 1.63 04/07/2011   PSA 6.73* 07/20/2011   INR 0.92 10/30/2011   HGBA1C 6.6* 02/23/2012       Assessment & Plan:

## 2012-03-14 NOTE — Assessment & Plan Note (Signed)
Continue with current prescription therapy as reflected on the Med list.  

## 2012-03-14 NOTE — Assessment & Plan Note (Signed)
Doing well 

## 2012-07-07 ENCOUNTER — Other Ambulatory Visit (INDEPENDENT_AMBULATORY_CARE_PROVIDER_SITE_OTHER): Payer: BC Managed Care – PPO

## 2012-07-07 DIAGNOSIS — E119 Type 2 diabetes mellitus without complications: Secondary | ICD-10-CM

## 2012-07-07 DIAGNOSIS — C61 Malignant neoplasm of prostate: Secondary | ICD-10-CM

## 2012-07-07 DIAGNOSIS — I10 Essential (primary) hypertension: Secondary | ICD-10-CM

## 2012-07-07 LAB — BASIC METABOLIC PANEL
BUN: 19 mg/dL (ref 6–23)
Calcium: 9.2 mg/dL (ref 8.4–10.5)
Creatinine, Ser: 1.1 mg/dL (ref 0.4–1.5)
GFR: 74.65 mL/min (ref >60.00)
Glucose, Bld: 158 mg/dL — ABNORMAL HIGH (ref 70–99)

## 2012-07-12 ENCOUNTER — Encounter: Payer: Self-pay | Admitting: Internal Medicine

## 2012-07-12 ENCOUNTER — Ambulatory Visit (INDEPENDENT_AMBULATORY_CARE_PROVIDER_SITE_OTHER): Payer: 59 | Admitting: Internal Medicine

## 2012-07-12 VITALS — BP 140/100 | Temp 97.8°F | Wt 174.0 lb

## 2012-07-12 DIAGNOSIS — E785 Hyperlipidemia, unspecified: Secondary | ICD-10-CM

## 2012-07-12 DIAGNOSIS — M545 Low back pain: Secondary | ICD-10-CM

## 2012-07-12 DIAGNOSIS — E119 Type 2 diabetes mellitus without complications: Secondary | ICD-10-CM

## 2012-07-12 DIAGNOSIS — C61 Malignant neoplasm of prostate: Secondary | ICD-10-CM

## 2012-07-12 DIAGNOSIS — I10 Essential (primary) hypertension: Secondary | ICD-10-CM

## 2012-07-12 MED ORDER — GLIMEPIRIDE 1 MG PO TABS: 1.0000 mg | ORAL_TABLET | Freq: Two times a day (BID) | ORAL | Status: DC

## 2012-07-12 MED ORDER — PIOGLITAZONE HCL 30 MG PO TABS: 30.0000 mg | ORAL_TABLET | Freq: Every day | ORAL | Status: DC

## 2012-07-12 MED ORDER — IBUPROFEN 800 MG PO TABS: 800.0000 mg | ORAL_TABLET | Freq: Three times a day (TID) | ORAL | Status: DC | PRN

## 2012-07-12 MED ORDER — METAXALONE 800 MG PO TABS: 800.0000 mg | ORAL_TABLET | Freq: Three times a day (TID) | ORAL | Status: DC | PRN

## 2012-07-12 MED ORDER — LISINOPRIL 20 MG PO TABS: 20.0000 mg | ORAL_TABLET | Freq: Two times a day (BID) | ORAL | Status: DC

## 2012-07-12 MED ORDER — LOVASTATIN 20 MG PO TABS: 20.0000 mg | ORAL_TABLET | Freq: Every day | ORAL | Status: DC

## 2012-07-12 NOTE — Assessment & Plan Note (Signed)
Chronic 3/14 worse/wt gain Loose wt and incr Glimep dose Labs in 3-4 mo

## 2012-07-12 NOTE — Assessment & Plan Note (Signed)
Continue with current prescription therapy as reflected on the Med list.  

## 2012-07-12 NOTE — Assessment & Plan Note (Signed)
Doing well 

## 2012-07-12 NOTE — Assessment & Plan Note (Signed)
Monitoring  Low fat diet

## 2012-07-12 NOTE — Progress Notes (Signed)
   Subjective:    Patient ID: Jacob Chapman, male    DOB: 09/25/1945, 67 y.o.   MRN: [id number]  HPI The patient presents for a follow-up of  chronic hypertension, chronic dyslipidemia, type 2 diabetes and gout controlled with medicines; f/u on prosate Ca - XRT seeds 7-8 mo ago   BP Readings from Last 3 Encounters:  07/12/12 140/100  03/14/12 128/90  12/04/11 141/90   Wt Readings from Last 3 Encounters:  07/12/12 174 lb (78.926 kg)  03/14/12 162 lb (73.483 kg)  12/04/11 162 lb 14.4 oz (73.891 kg)       Review of Systems  Constitutional: Negative for appetite change, fatigue and unexpected weight change.  HENT: Negative for nosebleeds, congestion, sore throat, sneezing, trouble swallowing and neck pain.   Eyes: Negative for itching and visual disturbance.  Respiratory: Negative for cough.   Cardiovascular: Negative for chest pain, palpitations and leg swelling.  Gastrointestinal: Negative for nausea, diarrhea, blood in stool and abdominal distention.  Genitourinary: Negative for frequency and hematuria.  Musculoskeletal: Negative for back pain, joint swelling and gait problem.  Skin: Negative for rash.  Neurological: Negative for dizziness, tremors, speech difficulty and weakness.  Psychiatric/Behavioral: Negative for sleep disturbance, dysphoric mood and agitation. The patient is not nervous/anxious.        Objective:   Physical Exam  Constitutional: He is oriented to person, place, and time. He appears well-developed.  HENT:  Mouth/Throat: Oropharynx is clear and moist.  Eyes: Conjunctivae are normal. Pupils are equal, round, and reactive to light.  Neck: Normal range of motion. No JVD present. No thyromegaly present.  Cardiovascular: Normal rate, regular rhythm, normal heart sounds and intact distal pulses.  Exam reveals no gallop and no friction rub.   No murmur heard. Pulmonary/Chest: Effort normal and breath sounds normal. No respiratory distress. He has no  wheezes. He has no rales. He exhibits no tenderness.  Abdominal: Soft. Bowel sounds are normal. He exhibits no distension and no mass. There is no tenderness. There is no rebound and no guarding.  Musculoskeletal: Normal range of motion. He exhibits no edema and no tenderness.  Lymphadenopathy:    He has no cervical adenopathy.  Neurological: He is alert and oriented to person, place, and time. He has normal reflexes. No cranial nerve deficit. He exhibits normal muscle tone. Coordination normal.  Skin: Skin is warm and dry. No rash noted.  Psychiatric: He has a normal mood and affect. His behavior is normal. Judgment and thought content normal.     Lab Results  Component Value Date   WBC 6.0 10/30/2011   HGB 15.3 10/30/2011   HCT 45.0 10/30/2011   PLT 180 10/30/2011   CHOL 159 04/07/2011   TRIG 124.0 04/07/2011   HDL 40.40 04/07/2011   ALT 20 10/30/2011   AST 18 10/30/2011   NA 141 07/07/2012   K 5.0 07/07/2012   CL 107 07/07/2012   CREATININE 1.1 07/07/2012   BUN 19 07/07/2012   CO2 26 07/07/2012   TSH 1.63 04/07/2011   PSA 6.73* 07/20/2011   INR 0.92 10/30/2011   HGBA1C 7.4* 07/07/2012       Assessment & Plan:

## 2012-10-12 ENCOUNTER — Other Ambulatory Visit: Payer: Self-pay

## 2012-10-12 MED ORDER — LISINOPRIL 20 MG PO TABS: 20.0000 mg | ORAL_TABLET | Freq: Two times a day (BID) | ORAL | Status: DC

## 2012-10-15 ENCOUNTER — Ambulatory Visit: Payer: 59

## 2012-10-15 ENCOUNTER — Ambulatory Visit (INDEPENDENT_AMBULATORY_CARE_PROVIDER_SITE_OTHER): Payer: 59 | Admitting: Emergency Medicine

## 2012-10-15 VITALS — BP 177/105 | HR 60 | Temp 97.7°F | Resp 16 | Ht 67.0 in | Wt 175.8 lb

## 2012-10-15 DIAGNOSIS — M79671 Pain in right foot: Secondary | ICD-10-CM

## 2012-10-15 DIAGNOSIS — M79609 Pain in unspecified limb: Secondary | ICD-10-CM

## 2012-10-15 DIAGNOSIS — Z862 Personal history of diseases of the blood and blood-forming organs and certain disorders involving the immune mechanism: Secondary | ICD-10-CM

## 2012-10-15 DIAGNOSIS — Z8739 Personal history of other diseases of the musculoskeletal system and connective tissue: Secondary | ICD-10-CM

## 2012-10-15 LAB — POCT CBC
MCH, POC: 30.4 pg (ref 27–31.2)
MCV: 98.8 fL — AB (ref 80–97)
MID (cbc): 0.4 (ref 0–0.9)
POC LYMPH PERCENT: 23.9 %L (ref 10–50)
Platelet Count, POC: 153 10*3/uL (ref 142–424)
RBC: 4.14 M/uL — AB (ref 4.69–6.13)
WBC: 4.3 10*3/uL — AB (ref 4.6–10.2)

## 2012-10-15 MED ORDER — COLCHICINE 0.6 MG PO TABS: 0.6000 mg | ORAL_TABLET | Freq: Two times a day (BID) | ORAL | Status: DC

## 2012-10-15 NOTE — Patient Instructions (Signed)
Gout  Gout is an inflammatory condition (arthritis) caused by a buildup of uric acid crystals in the joints. Uric acid is a chemical that is normally present in the blood. Under some circumstances, uric acid can form into crystals in your joints. This causes joint redness, soreness, and swelling (inflammation). Repeat attacks are common. Over time, uric acid crystals can form into masses (tophi) near a joint, causing disfigurement. Gout is treatable and often preventable.  CAUSES   The disease begins with elevated levels of uric acid in the blood. Uric acid is produced by your body when it breaks down a naturally found substance called purines. This also happens when you eat certain foods such as meats and fish. Causes of an elevated uric acid level include:   Being passed down from parent to child (heredity).   Diseases that cause increased uric acid production (obesity, psoriasis, some cancers).   Excessive alcohol use.   Diet, especially diets rich in meat and seafood.   Medicines, including certain cancer-fighting drugs (chemotherapy), diuretics, and aspirin.   Chronic kidney disease. The kidneys are no longer able to remove uric acid well.   Problems with metabolism.  Conditions strongly associated with gout include:   Obesity.   High blood pressure.   High cholesterol.   Diabetes.  Not everyone with elevated uric acid levels gets gout. It is not understood why some people get gout and others do not. Surgery, joint injury, and eating too much of certain foods are some of the factors that can lead to gout.  SYMPTOMS    An attack of gout comes on quickly. It causes intense pain with redness, swelling, and warmth in a joint.   Fever can occur.   Often, only one joint is involved. Certain joints are more commonly involved:   Base of the big toe.   Knee.   Ankle.   Wrist.   Finger.  Without treatment, an attack usually goes away in a few days to weeks. Between attacks, you usually will not have  symptoms, which is different from many other forms of arthritis.  DIAGNOSIS   Your caregiver will suspect gout based on your symptoms and exam. Removal of fluid from the joint (arthrocentesis) is done to check for uric acid crystals. Your caregiver will give you a medicine that numbs the area (local anesthetic) and use a needle to remove joint fluid for exam. Gout is confirmed when uric acid crystals are seen in joint fluid, using a special microscope. Sometimes, blood, urine, and X-ray tests are also used.  TREATMENT   There are 2 phases to gout treatment: treating the sudden onset (acute) attack and preventing attacks (prophylaxis).  Treatment of an Acute Attack   Medicines are used. These include anti-inflammatory medicines or steroid medicines.   An injection of steroid medicine into the affected joint is sometimes necessary.   The painful joint is rested. Movement can worsen the arthritis.   You may use warm or cold treatments on painful joints, depending which works best for you.   Discuss the use of coffee, vitamin C, or cherries with your caregiver. These may be helpful treatment options.  Treatment to Prevent Attacks  After the acute attack subsides, your caregiver may advise prophylactic medicine. These medicines either help your kidneys eliminate uric acid from your body or decrease your uric acid production. You may need to stay on these medicines for a very long time.  The early phase of treatment with prophylactic medicine can be associated   with an increase in acute gout attacks. For this reason, during the first few months of treatment, your caregiver may also advise you to take medicines usually used for acute gout treatment. Be sure you understand your caregiver's directions.  You should also discuss dietary treatment with your caregiver. Certain foods such as meats and fish can increase uric acid levels. Other foods such as dairy can decrease levels. Your caregiver can give you a list of foods  to avoid.  HOME CARE INSTRUCTIONS    Do not take aspirin to relieve pain. This raises uric acid levels.   Only take over-the-counter or prescription medicines for pain, discomfort, or fever as directed by your caregiver.   Rest the joint as much as possible. When in bed, keep sheets and blankets off painful areas.   Keep the affected joint raised (elevated).   Use crutches if the painful joint is in your leg.   Drink enough water and fluids to keep your urine clear or pale yellow. This helps your body get rid of uric acid. Do not drink alcoholic beverages. They slow the passage of uric acid.   Follow your caregiver's dietary instructions. Pay careful attention to the amount of protein you eat. Your daily diet should emphasize fruits, vegetables, whole grains, and fat-free or low-fat milk products.   Maintain a healthy body weight.  SEEK MEDICAL CARE IF:    You have an oral temperature above 102 F (38.9 C).   You develop diarrhea, vomiting, or any side effects from medicines.   You do not feel better in 24 hours, or you are getting worse.  SEEK IMMEDIATE MEDICAL CARE IF:    Your joint becomes suddenly more tender and you have:   Chills.   An oral temperature above 102 F (38.9 C), not controlled by medicine.  MAKE SURE YOU:    Understand these instructions.   Will watch your condition.   Will get help right away if you are not doing well or get worse.  Document Released: 04/03/2000 Document Revised: 06/29/2011 Document Reviewed: 07/15/2009  [employer] Patient Information 2014 [employer]

## 2012-10-15 NOTE — Progress Notes (Signed)
  Subjective:    Patient ID: Jacob Chapman, male    DOB: 1946-03-21, 67 y.o.   MRN: [id number]  HPI 67 year old man presents to clinic today with pain across the top of his Rt foot. He has a history of gout but has not had a flare for 4-5 years. His PCP is Dr. Huntley Dec- Pt stopped taking a daily med for gout about a year ago and this is this first flare he has had. He has an apt w/ PCP 11/15/12 for routine checkup. Pt states he has been taking 800mg  of Ibuprofen for the foot pain- he has taken 3 doses.   Review of Systems     Objective:   Physical Exam is tenderness across the dorsum of the foot. Dorsalis pedis pulses 2+. Color is good there is decreased sensation to the toes.  [facility] reading (PRIMARY) by  Dr. Cleta Alberts no abnormalities seen there is a minimal cortical irregularity of the fifth metatarsal.   Results for orders placed in visit on 10/15/12  POCT CBC      Result Value Range   WBC 4.3 (*) 4.6 - 10.2 K/uL   Lymph, poc 1.0  0.6 - 3.4   POC LYMPH PERCENT 23.9  10 - 50 %L   MID (cbc) 0.4  0 - 0.9   POC MID % 8.4  0 - 12 %M   POC Granulocyte 2.9  2 - 6.9   Granulocyte percent 67.7  37 - 80 %G   RBC 4.14 (*) 4.69 - 6.13 M/uL   Hemoglobin 12.6 (*) 14.1 - 18.1 g/dL   HCT, POC 40.9 (*) 81.1 - 53.7 %   MCV 98.8 (*) 80 - 97 fL   MCH, POC 30.4  27 - 31.2 pg   MCHC 30.8 (*) 31.8 - 35.4 g/dL   RDW, POC 91.4     Platelet Count, POC 153  142 - 424 K/uL   MPV 9.0  0 - 99.8 fL  GLUCOSE, POCT (MANUAL RESULT ENTRY)      Result Value Range   POC Glucose 137 (*) 70 - 99 mg/dl       Assessment & Plan:  Patient here with a right foot pain. He has a history of gout and has not been on medication for this.

## 2012-11-08 ENCOUNTER — Other Ambulatory Visit (INDEPENDENT_AMBULATORY_CARE_PROVIDER_SITE_OTHER): Payer: Self-pay

## 2012-11-08 DIAGNOSIS — M545 Low back pain: Secondary | ICD-10-CM

## 2012-11-08 DIAGNOSIS — I10 Essential (primary) hypertension: Secondary | ICD-10-CM

## 2012-11-08 DIAGNOSIS — E119 Type 2 diabetes mellitus without complications: Secondary | ICD-10-CM

## 2012-11-08 DIAGNOSIS — E785 Hyperlipidemia, unspecified: Secondary | ICD-10-CM

## 2012-11-08 DIAGNOSIS — C61 Malignant neoplasm of prostate: Secondary | ICD-10-CM

## 2012-11-08 LAB — BASIC METABOLIC PANEL
BUN: 14 mg/dL (ref 6–23)
Chloride: 108 mEq/L (ref 96–112)
Creatinine, Ser: 1 mg/dL (ref 0.4–1.5)
Glucose, Bld: 115 mg/dL — ABNORMAL HIGH (ref 70–99)
Potassium: 4.8 mEq/L (ref 3.5–5.1)

## 2012-11-15 ENCOUNTER — Encounter: Payer: Self-pay | Admitting: Internal Medicine

## 2012-11-15 ENCOUNTER — Ambulatory Visit (INDEPENDENT_AMBULATORY_CARE_PROVIDER_SITE_OTHER): Payer: 59 | Admitting: Internal Medicine

## 2012-11-15 VITALS — BP 152/108 | HR 80 | Temp 97.5°F | Resp 16 | Wt 173.0 lb

## 2012-11-15 DIAGNOSIS — I10 Essential (primary) hypertension: Secondary | ICD-10-CM

## 2012-11-15 DIAGNOSIS — C61 Malignant neoplasm of prostate: Secondary | ICD-10-CM

## 2012-11-15 DIAGNOSIS — M109 Gout, unspecified: Secondary | ICD-10-CM

## 2012-11-15 DIAGNOSIS — E785 Hyperlipidemia, unspecified: Secondary | ICD-10-CM

## 2012-11-15 DIAGNOSIS — E119 Type 2 diabetes mellitus without complications: Secondary | ICD-10-CM

## 2012-11-15 MED ORDER — AMLODIPINE BESYLATE 5 MG PO TABS: 5.0000 mg | ORAL_TABLET | Freq: Every day | ORAL | Status: DC

## 2012-11-15 MED ORDER — LOSARTAN POTASSIUM 100 MG PO TABS: 100.0000 mg | ORAL_TABLET | Freq: Every day | ORAL | Status: DC

## 2012-11-15 NOTE — Assessment & Plan Note (Signed)
BP Readings from Last 3 Encounters:  11/15/12 152/108  10/15/12 177/105  07/12/12 140/100   D/c Lisinopril Start Losartan and Amlodipine

## 2012-11-15 NOTE — Assessment & Plan Note (Signed)
Losartan should help Continue with current prescription therapy as reflected on the Med list. Labs

## 2012-11-15 NOTE — Assessment & Plan Note (Signed)
Wt Readings from Last 3 Encounters:  11/15/12 173 lb (78.472 kg)  10/15/12 175 lb 12.8 oz (79.742 kg)  07/12/12 174 lb (78.926 kg)

## 2012-11-15 NOTE — Assessment & Plan Note (Signed)
Continue with current prescription therapy as reflected on the Med list.  

## 2012-11-15 NOTE — Assessment & Plan Note (Signed)
S/p XRT seeds 11/06/11

## 2012-11-17 NOTE — Progress Notes (Signed)
   Subjective:    HPI The patient presents for a follow-up of  chronic hypertension, chronic dyslipidemia, type 2 diabetes and gout controlled with medicines; f/u on prosate Ca - XRT seeds    BP Readings from Last 3 Encounters:  11/15/12 152/108  10/15/12 177/105  07/12/12 140/100   Wt Readings from Last 3 Encounters:  11/15/12 173 lb (78.472 kg)  10/15/12 175 lb 12.8 oz (79.742 kg)  07/12/12 174 lb (78.926 kg)       Review of Systems  Constitutional: Negative for appetite change, fatigue and unexpected weight change.  HENT: Negative for nosebleeds, congestion, sore throat, sneezing, trouble swallowing and neck pain.   Eyes: Negative for itching and visual disturbance.  Respiratory: Negative for cough.   Cardiovascular: Negative for chest pain, palpitations and leg swelling.  Gastrointestinal: Negative for nausea, diarrhea, blood in stool and abdominal distention.  Genitourinary: Negative for frequency and hematuria.  Musculoskeletal: Negative for back pain, joint swelling and gait problem.  Skin: Negative for rash.  Neurological: Negative for dizziness, tremors, speech difficulty and weakness.  Psychiatric/Behavioral: Negative for sleep disturbance, dysphoric mood and agitation. The patient is not nervous/anxious.        Objective:   Physical Exam  Constitutional: He is oriented to person, place, and time. He appears well-developed.  HENT:  Mouth/Throat: Oropharynx is clear and moist.  Eyes: Conjunctivae are normal. Pupils are equal, round, and reactive to light.  Neck: Normal range of motion. No JVD present. No thyromegaly present.  Cardiovascular: Normal rate, regular rhythm, normal heart sounds and intact distal pulses.  Exam reveals no gallop and no friction rub.   No murmur heard. Pulmonary/Chest: Effort normal and breath sounds normal. No respiratory distress. He has no wheezes. He has no rales. He exhibits no tenderness.  Abdominal: Soft. Bowel sounds are  normal. He exhibits no distension and no mass. There is no tenderness. There is no rebound and no guarding.  Musculoskeletal: Normal range of motion. He exhibits no edema and no tenderness.  Lymphadenopathy:    He has no cervical adenopathy.  Neurological: He is alert and oriented to person, place, and time. He has normal reflexes. No cranial nerve deficit. He exhibits normal muscle tone. Coordination normal.  Skin: Skin is warm and dry. No rash noted.  Psychiatric: He has a normal mood and affect. His behavior is normal. Judgment and thought content normal.     Lab Results  Component Value Date   WBC 4.3* 10/15/2012   HGB 12.6* 10/15/2012   HCT 40.9* 10/15/2012   PLT 180 10/30/2011   CHOL 159 04/07/2011   TRIG 124.0 04/07/2011   HDL 40.40 04/07/2011   ALT 20 10/30/2011   AST 18 10/30/2011   NA 143 11/08/2012   K 4.8 11/08/2012   CL 108 11/08/2012   CREATININE 1.0 11/08/2012   BUN 14 11/08/2012   CO2 26 11/08/2012   TSH 1.63 04/07/2011   PSA 6.73* 07/20/2011   INR 0.92 10/30/2011   HGBA1C 7.2* 11/08/2012       Assessment & Plan:

## 2012-12-06 ENCOUNTER — Encounter: Payer: Self-pay | Admitting: *Deleted

## 2013-02-14 ENCOUNTER — Other Ambulatory Visit (INDEPENDENT_AMBULATORY_CARE_PROVIDER_SITE_OTHER): Payer: Medicare Other

## 2013-02-14 DIAGNOSIS — I10 Essential (primary) hypertension: Secondary | ICD-10-CM

## 2013-02-14 DIAGNOSIS — E119 Type 2 diabetes mellitus without complications: Secondary | ICD-10-CM

## 2013-02-14 DIAGNOSIS — C61 Malignant neoplasm of prostate: Secondary | ICD-10-CM

## 2013-02-14 DIAGNOSIS — E785 Hyperlipidemia, unspecified: Secondary | ICD-10-CM

## 2013-02-14 DIAGNOSIS — M109 Gout, unspecified: Secondary | ICD-10-CM

## 2013-02-14 LAB — BASIC METABOLIC PANEL
BUN: 15 mg/dL (ref 6–23)
CO2: 26 mEq/L (ref 19–32)
Calcium: 9.2 mg/dL (ref 8.4–10.5)
Creatinine, Ser: 0.9 mg/dL (ref 0.4–1.5)
GFR: 91.16 mL/min (ref >60.00)
Glucose, Bld: 127 mg/dL — ABNORMAL HIGH (ref 70–99)
Sodium: 141 mEq/L (ref 135–145)

## 2013-02-20 ENCOUNTER — Encounter: Payer: Self-pay | Admitting: Internal Medicine

## 2013-02-20 ENCOUNTER — Ambulatory Visit (INDEPENDENT_AMBULATORY_CARE_PROVIDER_SITE_OTHER): Payer: Medicare Other | Admitting: Internal Medicine

## 2013-02-20 VITALS — BP 162/102 | HR 80 | Temp 98.1°F | Resp 16 | Wt 174.0 lb

## 2013-02-20 DIAGNOSIS — E785 Hyperlipidemia, unspecified: Secondary | ICD-10-CM

## 2013-02-20 DIAGNOSIS — C61 Malignant neoplasm of prostate: Secondary | ICD-10-CM

## 2013-02-20 DIAGNOSIS — I10 Essential (primary) hypertension: Secondary | ICD-10-CM

## 2013-02-20 DIAGNOSIS — E119 Type 2 diabetes mellitus without complications: Secondary | ICD-10-CM

## 2013-02-20 MED ORDER — AMLODIPINE BESYLATE 5 MG PO TABS: 7.5000 mg | ORAL_TABLET | Freq: Every day | ORAL | Status: DC

## 2013-02-20 NOTE — Progress Notes (Signed)
Pre-visit discussion using our clinic review tool. No additional management support is needed unless otherwise documented below in the visit note.  

## 2013-02-20 NOTE — Progress Notes (Signed)
Patient ID: Jacob Chapman, male   DOB: 03-07-1946, 67 y.o.   MRN: [id number]   Subjective:    HPI The patient presents for a follow-up of  chronic hypertension, chronic dyslipidemia, type 2 diabetes and gout controlled with medicines; f/u on prosate Ca - XRT seeds    BP Readings from Last 3 Encounters:  02/20/13 162/102  11/15/12 152/108  10/15/12 177/105   Wt Readings from Last 3 Encounters:  02/20/13 174 lb (78.926 kg)  11/15/12 173 lb (78.472 kg)  10/15/12 175 lb 12.8 oz (79.742 kg)       Review of Systems  Constitutional: Negative for appetite change, fatigue and unexpected weight change.  HENT: Negative for congestion, nosebleeds, sneezing, sore throat and trouble swallowing.   Eyes: Negative for itching and visual disturbance.  Respiratory: Negative for cough.   Cardiovascular: Negative for chest pain, palpitations and leg swelling.  Gastrointestinal: Negative for nausea, diarrhea, blood in stool and abdominal distention.  Genitourinary: Negative for frequency and hematuria.  Musculoskeletal: Negative for back pain, gait problem, joint swelling and neck pain.  Skin: Negative for rash.  Neurological: Negative for dizziness, tremors, speech difficulty and weakness.  Psychiatric/Behavioral: Negative for sleep disturbance, dysphoric mood and agitation. The patient is not nervous/anxious.        Objective:   Physical Exam  Constitutional: He is oriented to person, place, and time. He appears well-developed.  HENT:  Mouth/Throat: Oropharynx is clear and moist.  Eyes: Conjunctivae are normal. Pupils are equal, round, and reactive to light.  Neck: Normal range of motion. No JVD present. No thyromegaly present.  Cardiovascular: Normal rate, regular rhythm, normal heart sounds and intact distal pulses.  Exam reveals no gallop and no friction rub.   No murmur heard. Pulmonary/Chest: Effort normal and breath sounds normal. No respiratory distress. He has no wheezes. He has  no rales. He exhibits no tenderness.  Abdominal: Soft. Bowel sounds are normal. He exhibits no distension and no mass. There is no tenderness. There is no rebound and no guarding.  Musculoskeletal: Normal range of motion. He exhibits no edema and no tenderness.  Lymphadenopathy:    He has no cervical adenopathy.  Neurological: He is alert and oriented to person, place, and time. He has normal reflexes. No cranial nerve deficit. He exhibits normal muscle tone. Coordination normal.  Skin: Skin is warm and dry. No rash noted.  Psychiatric: He has a normal mood and affect. His behavior is normal. Judgment and thought content normal.     Lab Results  Component Value Date   WBC 4.3* 10/15/2012   HGB 12.6* 10/15/2012   HCT 40.9* 10/15/2012   PLT 180 10/30/2011   CHOL 159 04/07/2011   TRIG 124.0 04/07/2011   HDL 40.40 04/07/2011   ALT 20 10/30/2011   AST 18 10/30/2011   NA 141 02/14/2013   K 4.6 02/14/2013   CL 108 02/14/2013   CREATININE 0.9 02/14/2013   BUN 15 02/14/2013   CO2 26 02/14/2013   TSH 1.63 04/07/2011   PSA 6.73* 07/20/2011   INR 0.92 10/30/2011   HGBA1C 7.2* 02/14/2013       Assessment & Plan:

## 2013-02-20 NOTE — Patient Instructions (Signed)
Cut back on fried food Loose weight

## 2013-02-20 NOTE — Assessment & Plan Note (Signed)
Continue with current prescription therapy as reflected on the Med list. Cut back on fried food

## 2013-02-22 NOTE — Assessment & Plan Note (Signed)
Continue with current prescription therapy as reflected on the Med list.  

## 2013-02-22 NOTE — Assessment & Plan Note (Signed)
Doing well 

## 2013-06-14 ENCOUNTER — Telehealth: Payer: Self-pay | Admitting: *Deleted

## 2013-06-14 MED ORDER — PIOGLITAZONE HCL 30 MG PO TABS: 30.0000 mg | ORAL_TABLET | Freq: Every day | ORAL | Status: DC

## 2013-06-14 NOTE — Telephone Encounter (Signed)
Spouse phoned to confirm change in patient's pharmacy to [facility] on [address]. Last OV with PCP 02/20/13.  Refilled per protocol.

## 2013-06-20 ENCOUNTER — Other Ambulatory Visit (INDEPENDENT_AMBULATORY_CARE_PROVIDER_SITE_OTHER): Payer: Medicare HMO

## 2013-06-20 DIAGNOSIS — E119 Type 2 diabetes mellitus without complications: Secondary | ICD-10-CM

## 2013-06-20 LAB — BASIC METABOLIC PANEL
BUN: 18 mg/dL (ref 6–23)
CO2: 25 mEq/L (ref 19–32)
Calcium: 9.1 mg/dL (ref 8.4–10.5)
Chloride: 105 mEq/L (ref 96–112)
Creatinine, Ser: 1 mg/dL (ref 0.4–1.5)
GFR: 80.53 mL/min (ref >60.00)
Glucose, Bld: 112 mg/dL — ABNORMAL HIGH (ref 70–99)
POTASSIUM: 4.8 meq/L (ref 3.5–5.1)
SODIUM: 137 meq/L (ref 135–145)

## 2013-06-20 LAB — HEMOGLOBIN A1C: HEMOGLOBIN A1C: 7.3 % — AB (ref 4.6–6.5)

## 2013-06-26 ENCOUNTER — Encounter: Payer: Self-pay | Admitting: Internal Medicine

## 2013-06-26 ENCOUNTER — Ambulatory Visit (INDEPENDENT_AMBULATORY_CARE_PROVIDER_SITE_OTHER): Payer: Medicare HMO | Admitting: Internal Medicine

## 2013-06-26 ENCOUNTER — Ambulatory Visit: Payer: Medicare Other | Admitting: Internal Medicine

## 2013-06-26 VITALS — BP 140/90 | HR 72 | Temp 97.7°F | Resp 16 | Wt 175.0 lb

## 2013-06-26 DIAGNOSIS — M109 Gout, unspecified: Secondary | ICD-10-CM

## 2013-06-26 DIAGNOSIS — I10 Essential (primary) hypertension: Secondary | ICD-10-CM

## 2013-06-26 DIAGNOSIS — E119 Type 2 diabetes mellitus without complications: Secondary | ICD-10-CM

## 2013-06-26 NOTE — Progress Notes (Signed)
   Subjective:    HPI  The patient presents for a follow-up of  chronic hypertension, chronic dyslipidemia, type 2 diabetes and gout controlled with medicines; f/u on prosate Ca - XRT seeds    BP Readings from Last 3 Encounters:  06/26/13 140/90  02/20/13 162/102  11/15/12 152/108   Wt Readings from Last 3 Encounters:  06/26/13 175 lb (79.379 kg)  02/20/13 174 lb (78.926 kg)  11/15/12 173 lb (78.472 kg)       Review of Systems  Constitutional: Negative for appetite change, fatigue and unexpected weight change.  HENT: Negative for congestion, nosebleeds, sneezing, sore throat and trouble swallowing.   Eyes: Negative for itching and visual disturbance.  Respiratory: Negative for cough.   Cardiovascular: Negative for chest pain, palpitations and leg swelling.  Gastrointestinal: Negative for nausea, diarrhea, blood in stool and abdominal distention.  Genitourinary: Negative for frequency and hematuria.  Musculoskeletal: Negative for back pain, gait problem, joint swelling and neck pain.  Skin: Negative for rash.  Neurological: Negative for dizziness, tremors, speech difficulty and weakness.  Psychiatric/Behavioral: Negative for sleep disturbance, dysphoric mood and agitation. The patient is not nervous/anxious.        Objective:   Physical Exam  Constitutional: He is oriented to person, place, and time. He appears well-developed.  HENT:  Mouth/Throat: Oropharynx is clear and moist.  Eyes: Conjunctivae are normal. Pupils are equal, round, and reactive to light.  Neck: Normal range of motion. No JVD present. No thyromegaly present.  Cardiovascular: Normal rate, regular rhythm, normal heart sounds and intact distal pulses.  Exam reveals no gallop and no friction rub.   No murmur heard. Pulmonary/Chest: Effort normal and breath sounds normal. No respiratory distress. He has no wheezes. He has no rales. He exhibits no tenderness.  Abdominal: Soft. Bowel sounds are normal. He  exhibits no distension and no mass. There is no tenderness. There is no rebound and no guarding.  Musculoskeletal: Normal range of motion. He exhibits no edema and no tenderness.  Lymphadenopathy:    He has no cervical adenopathy.  Neurological: He is alert and oriented to person, place, and time. He has normal reflexes. No cranial nerve deficit. He exhibits normal muscle tone. Coordination normal.  Skin: Skin is warm and dry. No rash noted.  Psychiatric: He has a normal mood and affect. His behavior is normal. Judgment and thought content normal.     Lab Results  Component Value Date   WBC 4.3* 10/15/2012   HGB 12.6* 10/15/2012   HCT 40.9* 10/15/2012   PLT 180 10/30/2011   CHOL 159 04/07/2011   TRIG 124.0 04/07/2011   HDL 40.40 04/07/2011   ALT 20 10/30/2011   AST 18 10/30/2011   NA 137 06/20/2013   K 4.8 06/20/2013   CL 105 06/20/2013   CREATININE 1.0 06/20/2013   BUN 18 06/20/2013   CO2 25 06/20/2013   TSH 1.63 04/07/2011   PSA 6.73* 07/20/2011   INR 0.92 10/30/2011   HGBA1C 7.3* 06/20/2013       Assessment & Plan:

## 2013-06-26 NOTE — Assessment & Plan Note (Signed)
Continue with current prescription therapy as reflected on the Med list.  

## 2013-06-26 NOTE — Patient Instructions (Signed)
BP Readings from Last 3 Encounters:  06/26/13 140/90  02/20/13 162/102  11/15/12 152/108

## 2013-06-26 NOTE — Assessment & Plan Note (Signed)
Labs in 3-4 mo Better diet

## 2013-06-26 NOTE — Assessment & Plan Note (Signed)
Wt Readings from Last 3 Encounters:  06/26/13 175 lb (79.379 kg)  02/20/13 174 lb (78.926 kg)  11/15/12 173 lb (78.472 kg)   Continue with current prescription therapy as reflected on the Med list.

## 2013-06-26 NOTE — Progress Notes (Signed)
Pre visit review using our clinic review tool, if applicable. No additional management support is needed unless otherwise documented below in the visit note. 

## 2013-06-30 ENCOUNTER — Telehealth: Payer: Self-pay | Admitting: Internal Medicine

## 2013-06-30 ENCOUNTER — Telehealth: Payer: Self-pay | Admitting: *Deleted

## 2013-06-30 MED ORDER — PIOGLITAZONE HCL 30 MG PO TABS: 30.0000 mg | ORAL_TABLET | Freq: Every day | ORAL | Status: DC

## 2013-06-30 MED ORDER — GLIMEPIRIDE 1 MG PO TABS: 1.0000 mg | ORAL_TABLET | Freq: Two times a day (BID) | ORAL | Status: DC

## 2013-06-30 MED ORDER — METAXALONE 800 MG PO TABS: 800.0000 mg | ORAL_TABLET | Freq: Three times a day (TID) | ORAL | Status: DC | PRN

## 2013-06-30 MED ORDER — LOVASTATIN 20 MG PO TABS: 20.0000 mg | ORAL_TABLET | Freq: Every day | ORAL | Status: DC

## 2013-06-30 MED ORDER — LOSARTAN POTASSIUM 100 MG PO TABS: 100.0000 mg | ORAL_TABLET | Freq: Every day | ORAL | Status: DC

## 2013-06-30 MED ORDER — AMLODIPINE BESYLATE 5 MG PO TABS: 7.5000 mg | ORAL_TABLET | Freq: Every day | ORAL | Status: DC

## 2013-06-30 MED ORDER — COLCHICINE 0.6 MG PO TABS: 0.6000 mg | ORAL_TABLET | Freq: Two times a day (BID) | ORAL | Status: DC

## 2013-06-30 MED ORDER — IBUPROFEN 800 MG PO TABS: 800.0000 mg | ORAL_TABLET | Freq: Three times a day (TID) | ORAL | Status: DC | PRN

## 2013-06-30 NOTE — Telephone Encounter (Signed)
Spouse phoned triage line at 1303 and at 1417 wanting Jacob Chapman to return her call.  CB# [phone]

## 2013-06-30 NOTE — Telephone Encounter (Signed)
Rfs sent. Pt's wife informed

## 2013-06-30 NOTE — Telephone Encounter (Signed)
Pt request all of his [facility]  Phone #[phone]. Please help, pt stated he is out of those med.

## 2013-07-06 ENCOUNTER — Other Ambulatory Visit: Payer: Self-pay | Admitting: *Deleted

## 2013-09-22 ENCOUNTER — Telehealth: Payer: Self-pay | Admitting: *Deleted

## 2013-09-22 DIAGNOSIS — N139 Obstructive and reflux uropathy, unspecified: Secondary | ICD-10-CM

## 2013-09-22 DIAGNOSIS — N401 Enlarged prostate with lower urinary tract symptoms: Secondary | ICD-10-CM

## 2013-09-22 NOTE — Telephone Encounter (Signed)
Request for referral with Dr. Kathie Rhodes for BPH  with urinary obstruction. 600.01 and 599.69. Referral placed.

## 2013-10-17 ENCOUNTER — Other Ambulatory Visit (INDEPENDENT_AMBULATORY_CARE_PROVIDER_SITE_OTHER): Payer: Medicare HMO

## 2013-10-17 DIAGNOSIS — I10 Essential (primary) hypertension: Secondary | ICD-10-CM

## 2013-10-17 DIAGNOSIS — M109 Gout, unspecified: Secondary | ICD-10-CM

## 2013-10-17 DIAGNOSIS — E119 Type 2 diabetes mellitus without complications: Secondary | ICD-10-CM

## 2013-10-17 LAB — BASIC METABOLIC PANEL
BUN: 15 mg/dL (ref 6–23)
CHLORIDE: 105 meq/L (ref 96–112)
CO2: 27 meq/L (ref 19–32)
Calcium: 9.1 mg/dL (ref 8.4–10.5)
Creatinine, Ser: 0.9 mg/dL (ref 0.4–1.5)
GFR: 90.97 mL/min (ref >60.00)
GLUCOSE: 128 mg/dL — AB (ref 70–99)
POTASSIUM: 4.3 meq/L (ref 3.5–5.1)
SODIUM: 139 meq/L (ref 135–145)

## 2013-10-17 LAB — HEMOGLOBIN A1C: Hgb A1c MFr Bld: 7.1 % — ABNORMAL HIGH (ref 4.6–6.5)

## 2013-10-23 ENCOUNTER — Telehealth: Payer: Self-pay

## 2013-10-23 DIAGNOSIS — E119 Type 2 diabetes mellitus without complications: Secondary | ICD-10-CM

## 2013-10-23 NOTE — Telephone Encounter (Signed)
Diabetic bundle- lipid, bmet and a1c ordered 

## 2013-10-24 ENCOUNTER — Ambulatory Visit (INDEPENDENT_AMBULATORY_CARE_PROVIDER_SITE_OTHER): Payer: Medicare HMO | Admitting: Internal Medicine

## 2013-10-24 ENCOUNTER — Encounter: Payer: Self-pay | Admitting: Internal Medicine

## 2013-10-24 VITALS — BP 130/85 | HR 76 | Temp 97.5°F | Resp 16 | Wt 172.0 lb

## 2013-10-24 DIAGNOSIS — E119 Type 2 diabetes mellitus without complications: Secondary | ICD-10-CM

## 2013-10-24 DIAGNOSIS — Z23 Encounter for immunization: Secondary | ICD-10-CM

## 2013-10-24 NOTE — Progress Notes (Signed)
Pre visit review using our clinic review tool, if applicable. No additional management support is needed unless otherwise documented below in the visit note. 

## 2013-10-24 NOTE — Progress Notes (Signed)
   Subjective:    HPI  The patient presents for a follow-up of  chronic hypertension, chronic dyslipidemia, type 2 diabetes and gout controlled with medicines; f/u on prosate Ca - XRT seeds  - Urol f/u q 6 mo   BP Readings from Last 3 Encounters:  10/24/13 130/85  06/26/13 140/90  02/20/13 162/102   Wt Readings from Last 3 Encounters:  10/24/13 172 lb (78.019 kg)  06/26/13 175 lb (79.379 kg)  02/20/13 174 lb (78.926 kg)       Review of Systems  Constitutional: Negative for appetite change, fatigue and unexpected weight change.  HENT: Negative for congestion, nosebleeds, sneezing, sore throat and trouble swallowing.   Eyes: Negative for itching and visual disturbance.  Respiratory: Negative for cough.   Cardiovascular: Negative for chest pain, palpitations and leg swelling.  Gastrointestinal: Negative for nausea, diarrhea, blood in stool and abdominal distention.  Genitourinary: Negative for frequency and hematuria.  Musculoskeletal: Negative for back pain, gait problem, joint swelling and neck pain.  Skin: Negative for rash.  Neurological: Negative for dizziness, tremors, speech difficulty and weakness.  Psychiatric/Behavioral: Negative for sleep disturbance, dysphoric mood and agitation. The patient is not nervous/anxious.        Objective:   Physical Exam  Constitutional: He is oriented to person, place, and time. He appears well-developed.  HENT:  Mouth/Throat: Oropharynx is clear and moist.  Eyes: Conjunctivae are normal. Pupils are equal, round, and reactive to light.  Neck: Normal range of motion. No JVD present. No thyromegaly present.  Cardiovascular: Normal rate, regular rhythm, normal heart sounds and intact distal pulses.  Exam reveals no gallop and no friction rub.   No murmur heard. Pulmonary/Chest: Effort normal and breath sounds normal. No respiratory distress. He has no wheezes. He has no rales. He exhibits no tenderness.  Abdominal: Soft. Bowel sounds  are normal. He exhibits no distension and no mass. There is no tenderness. There is no rebound and no guarding.  Musculoskeletal: Normal range of motion. He exhibits no edema and no tenderness.  Lymphadenopathy:    He has no cervical adenopathy.  Neurological: He is alert and oriented to person, place, and time. He has normal reflexes. No cranial nerve deficit. He exhibits normal muscle tone. Coordination normal.  Skin: Skin is warm and dry. No rash noted.  Psychiatric: He has a normal mood and affect. His behavior is normal. Judgment and thought content normal.     Lab Results  Component Value Date   WBC 4.3* 10/15/2012   HGB 12.6* 10/15/2012   HCT 40.9* 10/15/2012   PLT 180 10/30/2011   CHOL 159 04/07/2011   TRIG 124.0 04/07/2011   HDL 40.40 04/07/2011   ALT 20 10/30/2011   AST 18 10/30/2011   NA 139 10/17/2013   K 4.3 10/17/2013   CL 105 10/17/2013   CREATININE 0.9 10/17/2013   BUN 15 10/17/2013   CO2 27 10/17/2013   TSH 1.63 04/07/2011   PSA 6.73* 07/20/2011   INR 0.92 10/30/2011   HGBA1C 7.1* 10/17/2013       Assessment & Plan:

## 2013-10-24 NOTE — Assessment & Plan Note (Signed)
Continue with current prescription therapy as reflected on the Med list. Better 

## 2014-01-18 ENCOUNTER — Telehealth: Payer: Self-pay | Admitting: Radiology

## 2014-01-18 ENCOUNTER — Ambulatory Visit (INDEPENDENT_AMBULATORY_CARE_PROVIDER_SITE_OTHER): Payer: Commercial Managed Care - HMO | Admitting: Family Medicine

## 2014-01-18 VITALS — BP 138/82 | HR 60 | Temp 97.7°F | Resp 16 | Ht 66.25 in | Wt 176.2 lb

## 2014-01-18 DIAGNOSIS — Z8739 Personal history of other diseases of the musculoskeletal system and connective tissue: Secondary | ICD-10-CM

## 2014-01-18 DIAGNOSIS — S8010XA Contusion of unspecified lower leg, initial encounter: Secondary | ICD-10-CM

## 2014-01-18 DIAGNOSIS — Z8639 Personal history of other endocrine, nutritional and metabolic disease: Secondary | ICD-10-CM

## 2014-01-18 DIAGNOSIS — L52 Erythema nodosum: Secondary | ICD-10-CM

## 2014-01-18 DIAGNOSIS — D692 Other nonthrombocytopenic purpura: Secondary | ICD-10-CM

## 2014-01-18 DIAGNOSIS — M79672 Pain in left foot: Secondary | ICD-10-CM

## 2014-01-18 LAB — URIC ACID: Uric Acid, Serum: 5.2 mg/dL (ref 4.0–7.8)

## 2014-01-18 LAB — POCT CBC
GRANULOCYTE PERCENT: 66 % (ref 37–80)
HEMATOCRIT: 44.3 % (ref 43.5–53.7)
Hemoglobin: 14.6 g/dL (ref 14.1–18.1)
Lymph, poc: 1.3 (ref 0.6–3.4)
MCH: 31.7 pg — AB (ref 27–31.2)
MCHC: 32.8 g/dL (ref 31.8–35.4)
MCV: 96.4 fL (ref 80–97)
MID (cbc): 0.3 (ref 0–0.9)
MPV: 7.5 fL (ref 0–99.8)
POC Granulocyte: 3.2 (ref 2–6.9)
POC LYMPH %: 27 % (ref 10–50)
POC MID %: 7 %M (ref 0–12)
Platelet Count, POC: 192 10*3/uL (ref 142–424)
RBC: 4.6 M/uL — AB (ref 4.69–6.13)
RDW, POC: 15.1 %
WBC: 4.9 10*3/uL (ref 4.6–10.2)

## 2014-01-18 LAB — PROTIME-INR

## 2014-01-18 LAB — APTT

## 2014-01-18 MED ORDER — PREDNISONE 20 MG PO TABS: ORAL_TABLET | ORAL | Status: DC

## 2014-01-18 NOTE — Telephone Encounter (Signed)
[facility] called back and they are also not able to perform the PTT on this pt. Please advise.

## 2014-01-18 NOTE — Progress Notes (Signed)
Subjective: Patient has a history of gout, and has been hurting in his right heel. He thought he was getting a flare of gout again. Last night he noticed a little erythematous nodularity just at the top of his boot line on the left ankle. This got a little bit more prominent today. Knows of no trauma. He does wear boots all the time. He has had some pain in his feet hit the floor the last few days. He has no history of strep-type infection. He is diabetic and on a number of medications.  Objective: 3 the confluent erythematous nodules on the left ankle, and an erythematous ecchymotic band on the right ankle. These are purpuric-looking areas measuring 1 a half centimeters in diameter with a couple of centimeter base of induration. He is tender at the base of his right calcaneus. No other lesions on his skin to be noted elsewhere.   Results for orders placed in visit on 01/18/14  POCT CBC      Result Value Ref Range   WBC 4.9  4.6 - 10.2 K/uL   Lymph, poc 1.3  0.6 - 3.4   POC LYMPH PERCENT 27.0  10 - 50 %L   MID (cbc) 0.3  0 - 0.9   POC MID % 7.0  0 - 12 %M   POC Granulocyte 3.2  2 - 6.9   Granulocyte percent 66.0  37 - 80 %G   RBC 4.60 (*) 4.69 - 6.13 M/uL   Hemoglobin 14.6  14.1 - 18.1 g/dL   HCT, POC 44.3  43.5 - 53.7 %   MCV 96.4  80 - 97 fL   MCH, POC 31.7 (*) 27 - 31.2 pg   MCHC 32.8  31.8 - 35.4 g/dL   RDW, POC 15.1     Platelet Count, POC 192  142 - 424 K/uL   MPV 7.5  0 - 99.8 fL   Assessment: Erythema nodosum purpura Heel pain History of gout Diabetes  Plan: Platelet count was good. Check some additional labs. This is a little bit unusual in appearance, but I believe it probably represents erythema nodosum. Will followup in a few days. Treat with nonsteroidals and prednisone. Cautioned him that it may raise his blood sugars. Return at anytime if worse, otherwise the first part of the week he should get rechecked either by me or by his primary care physician Dr. Alain Marion.

## 2014-01-18 NOTE — Patient Instructions (Signed)
Take ibuprofen 800 mg 3 times daily for about 5 days, then as necessary   Prednisone 3 tablets daily for 2 days, then 2 daily for 2 days, then one daily for 2 days  If it is worse at anytime please return, otherwise plan to get rechecked about Monday. You can either return here to see me, or see your primary care physician.  Monitor your blood sugars. They will go up on the prednisone, and if over 200 please discontinue the prednisone  Erythema Nodosum Erythema nodosum is also called "painful red nodules on the legs". Symptoms can include sudden onset of fever, fatigue and joint pains. Red, deep, tender, raised, bruise-like bumps form on the shin, or sometimes on the arms or trunk. The skin over the bumps is usually shiny. The symptoms usually last 1-2 weeks. The symptoms usually improve once the cause is treated. This illness may be caused by strep or fungal infection, drug reaction, pregnancy, or other medical conditions. Treating the cause is important to early recovery. Erythema nodosum occurs at any age and in both sexes but more often in young adult women. Erythema nodosum is not a skin infection. It is a reaction to something internal. In most cases the illness and bumps go away with treatment. You should avoid any medicine that may have caused this reaction. Antihistamine drugs, anti-inflammatory drugs or cortisone medicine may be prescribed to relieve symptoms. SSKI drops ([place]) taken with juice at breakfast, lunch and dinner may have an anti-inflammatory effect and speed the healing. Bed rest and limiting vigorous exercise help to shorten the course of erythema nodosum. Elevation of the affected limb also helps in recovery. As the condition gets better, the bumps flatten and your skin may heal with temporary bruise marks. These dark marks will clear up in several months and they are a good sign that your skin is healing.  SEEK IMMEDIATE MEDICAL CARE IF:   Your condition worsens, or if you  have more severe symptoms such a high fever, sore throat, or repeated vomiting. Document Released: 05/14/2004 Document Revised: 06/29/2011 Document Reviewed: 05/18/2008 [facility] Patient Information 2015 [place], [place]. This information is not intended to replace advice given to you by your health care provider. Make sure you discuss any questions you have with your health care provider.

## 2014-01-18 NOTE — Telephone Encounter (Signed)
Dr hopper-- A mistake was made with one of Jacob Chapman's tube of blood..it was sent to [facility] with no name on it. I do apologize for this. They are able to run everything except the PT INR. Do you want [place] to call him back in to re-draw his blood?  Again, my apologies.

## 2014-01-19 LAB — ANA: ANA: NEGATIVE

## 2014-01-21 ENCOUNTER — Encounter: Payer: Self-pay | Admitting: *Deleted

## 2014-01-22 ENCOUNTER — Ambulatory Visit (INDEPENDENT_AMBULATORY_CARE_PROVIDER_SITE_OTHER): Payer: Commercial Managed Care - HMO | Admitting: Family Medicine

## 2014-01-22 VITALS — BP 138/86 | HR 76 | Temp 98.1°F | Resp 16 | Ht 65.25 in | Wt 174.4 lb

## 2014-01-22 DIAGNOSIS — L52 Erythema nodosum: Secondary | ICD-10-CM

## 2014-01-22 DIAGNOSIS — M79671 Pain in right foot: Secondary | ICD-10-CM

## 2014-01-22 NOTE — Patient Instructions (Signed)
Complete the course of prednisone  Take the ibuprofen once or twice daily for the next few days then discontinue it.  If the rash recurs come in and get rechecked or comes to see your primary care doctor. Make sure he knows about what is taking place.  It is my impression that I need to labeled this as erythema nodosum, though unable to make a definite diagnosis. Therefore it is very important that you get reexamined if problems recur.

## 2014-01-22 NOTE — Progress Notes (Signed)
Subjective: Patient was here with a heel pain and rash on his ankles late last week. He was treated for possible erythema nodosum, while being evaluated by Jacob Chapman.  Reviewed his labs with him today. Nothing suggestive of gout. The ANA was negative.  Objective: The rash is essentially resolved. The heel is no longer tender.  Assessment: Probable erythema nodosum causing ankle rash in heel pain, resolved  Followup as necessary.

## 2014-01-25 NOTE — Telephone Encounter (Signed)
Doing better so will ignore

## 2014-02-20 ENCOUNTER — Other Ambulatory Visit (INDEPENDENT_AMBULATORY_CARE_PROVIDER_SITE_OTHER): Payer: Medicare HMO

## 2014-02-20 DIAGNOSIS — E119 Type 2 diabetes mellitus without complications: Secondary | ICD-10-CM

## 2014-02-20 LAB — BASIC METABOLIC PANEL
BUN: 15 mg/dL (ref 6–23)
CO2: 30 mEq/L (ref 19–32)
Calcium: 9.2 mg/dL (ref 8.4–10.5)
Chloride: 106 mEq/L (ref 96–112)
Creatinine, Ser: 1 mg/dL (ref 0.4–1.5)
GFR: 84.28 mL/min (ref >60.00)
Glucose, Bld: 118 mg/dL — ABNORMAL HIGH (ref 70–99)
Potassium: 4.5 mEq/L (ref 3.5–5.1)
SODIUM: 140 meq/L (ref 135–145)

## 2014-02-20 LAB — LIPID PANEL
CHOLESTEROL: 169 mg/dL (ref 0–200)
HDL: 38.5 mg/dL — ABNORMAL LOW (ref >39.00)
LDL Cholesterol: 91 mg/dL (ref 0–99)
NonHDL: 130.5
TRIGLYCERIDES: 199 mg/dL — AB (ref 0.0–149.0)
Total CHOL/HDL Ratio: 4
VLDL: 39.8 mg/dL (ref 0.0–40.0)

## 2014-02-20 LAB — HEMOGLOBIN A1C: Hgb A1c MFr Bld: 7.4 % — ABNORMAL HIGH (ref 4.6–6.5)

## 2014-02-26 ENCOUNTER — Encounter: Payer: Self-pay | Admitting: Internal Medicine

## 2014-02-26 ENCOUNTER — Ambulatory Visit (INDEPENDENT_AMBULATORY_CARE_PROVIDER_SITE_OTHER): Payer: Medicare HMO | Admitting: Internal Medicine

## 2014-02-26 VITALS — BP 130/85 | HR 70 | Temp 97.7°F | Wt 174.0 lb

## 2014-02-26 DIAGNOSIS — M722 Plantar fascial fibromatosis: Secondary | ICD-10-CM

## 2014-02-26 MED ORDER — ACYCLOVIR 400 MG PO TABS: 400.0000 mg | ORAL_TABLET | Freq: Three times a day (TID) | ORAL | Status: DC

## 2014-02-26 MED ORDER — IBUPROFEN 800 MG PO TABS: 800.0000 mg | ORAL_TABLET | Freq: Three times a day (TID) | ORAL | Status: DC | PRN

## 2014-02-26 MED ORDER — IBUPROFEN 600 MG PO TABS: ORAL_TABLET | ORAL | Status: DC

## 2014-02-26 NOTE — Assessment & Plan Note (Signed)
Ice Meds Inserts/ arch supports

## 2014-02-26 NOTE — Progress Notes (Signed)
Patient ID: Jacob Chapman, male   DOB: 06/03/1945, 68 y.o.   MRN: [id number]   Subjective:    HPI  The patient presents for a follow-up of  chronic hypertension, chronic dyslipidemia, type 2 diabetes and gout controlled with medicines; f/u on prosate Ca - XRT seeds  - Urol f/u q 6 mo  C/o bad R heel pain x 2 weeks   BP Readings from Last 3 Encounters:  02/26/14 130/85  01/22/14 138/86  01/18/14 138/82   Wt Readings from Last 3 Encounters:  02/26/14 174 lb (78.926 kg)  01/22/14 174 lb 6.4 oz (79.107 kg)  01/18/14 176 lb 3.2 oz (79.924 kg)       Review of Systems  Constitutional: Negative for appetite change, fatigue and unexpected weight change.  HENT: Negative for congestion, nosebleeds, sneezing, sore throat and trouble swallowing.   Eyes: Negative for itching and visual disturbance.  Respiratory: Negative for cough.   Cardiovascular: Negative for chest pain, palpitations and leg swelling.  Gastrointestinal: Negative for nausea, diarrhea, blood in stool and abdominal distention.  Genitourinary: Negative for frequency and hematuria.  Musculoskeletal: Negative for back pain, joint swelling, gait problem and neck pain.  Skin: Negative for rash.  Neurological: Negative for dizziness, tremors, speech difficulty and weakness.  Psychiatric/Behavioral: Negative for sleep disturbance, dysphoric mood and agitation. The patient is not nervous/anxious.        Objective:   Physical Exam  Constitutional: He is oriented to person, place, and time. He appears well-developed. No distress.  NAD  HENT:  Mouth/Throat: Oropharynx is clear and moist.  Eyes: Conjunctivae are normal. Pupils are equal, round, and reactive to light.  Neck: Normal range of motion. No JVD present. No thyromegaly present.  Cardiovascular: Normal rate, regular rhythm, normal heart sounds and intact distal pulses.  Exam reveals no gallop and no friction rub.   No murmur heard. Pulmonary/Chest: Effort normal and  breath sounds normal. No respiratory distress. He has no wheezes. He has no rales. He exhibits no tenderness.  Abdominal: Soft. Bowel sounds are normal. He exhibits no distension and no mass. There is no tenderness. There is no rebound and no guarding.  Musculoskeletal: Normal range of motion. He exhibits no edema or tenderness.  Lymphadenopathy:    He has no cervical adenopathy.  Neurological: He is alert and oriented to person, place, and time. He has normal reflexes. No cranial nerve deficit. He exhibits normal muscle tone. He displays a negative Romberg sign. Coordination and gait normal.  No meningeal signs  Skin: Skin is warm and dry. No rash noted.  Psychiatric: He has a normal mood and affect. His behavior is normal. Judgment and thought content normal.  R heel is tender   Lab Results  Component Value Date   WBC 4.9 01/18/2014   HGB 14.6 01/18/2014   HCT 44.3 01/18/2014   PLT 180 10/30/2011   CHOL 169 02/20/2014   TRIG 199.0* 02/20/2014   HDL 38.50* 02/20/2014   ALT 20 10/30/2011   AST 18 10/30/2011   NA 140 02/20/2014   K 4.5 02/20/2014   CL 106 02/20/2014   CREATININE 1.0 02/20/2014   BUN 15 02/20/2014   CO2 30 02/20/2014   TSH 1.63 04/07/2011   PSA 6.73* 07/20/2011   INR CANCELED 01/18/2014   HGBA1C 7.4* 02/20/2014       Assessment & Plan:

## 2014-02-26 NOTE — Progress Notes (Signed)
Pre visit review using our clinic review tool, if applicable. No additional management support is needed unless otherwise documented below in the visit note. 

## 2014-04-26 ENCOUNTER — Ambulatory Visit (INDEPENDENT_AMBULATORY_CARE_PROVIDER_SITE_OTHER): Payer: Commercial Managed Care - HMO | Admitting: Family Medicine

## 2014-04-26 VITALS — BP 130/90 | HR 74 | Temp 97.5°F | Resp 16 | Ht 66.5 in | Wt 173.6 lb

## 2014-04-26 DIAGNOSIS — E119 Type 2 diabetes mellitus without complications: Secondary | ICD-10-CM

## 2014-04-26 DIAGNOSIS — A09 Infectious gastroenteritis and colitis, unspecified: Secondary | ICD-10-CM | POA: Diagnosis not present

## 2014-04-26 DIAGNOSIS — R109 Unspecified abdominal pain: Secondary | ICD-10-CM | POA: Diagnosis not present

## 2014-04-26 DIAGNOSIS — M79672 Pain in left foot: Secondary | ICD-10-CM | POA: Diagnosis not present

## 2014-04-26 DIAGNOSIS — R197 Diarrhea, unspecified: Secondary | ICD-10-CM | POA: Diagnosis not present

## 2014-04-26 LAB — POCT CBC
GRANULOCYTE PERCENT: 72.9 % (ref 37–80)
HEMATOCRIT: 47 % (ref 43.5–53.7)
HEMOGLOBIN: 15.2 g/dL (ref 14.1–18.1)
LYMPH, POC: 1 (ref 0.6–3.4)
MCH, POC: 31.1 pg (ref 27–31.2)
MCHC: 32.3 g/dL (ref 31.8–35.4)
MCV: 96.1 fL (ref 80–97)
MID (cbc): 0.7 (ref 0–0.9)
MPV: 7.2 fL (ref 0–99.8)
POC Granulocyte: 4.7 (ref 2–6.9)
POC LYMPH PERCENT: 16 %L (ref 10–50)
POC MID %: 11.1 % (ref 0–12)
Platelet Count, POC: 191 10*3/uL (ref 142–424)
RBC: 4.89 M/uL (ref 4.69–6.13)
RDW, POC: 14.9 %
WBC: 6.5 10*3/uL (ref 4.6–10.2)

## 2014-04-26 LAB — GLUCOSE, POCT (MANUAL RESULT ENTRY): POC Glucose: 150 mg/dl — AB (ref 70–99)

## 2014-04-26 NOTE — Patient Instructions (Signed)
Drink plenty of fluids. Avoid dairy products and alcohol.  Eat some bananas, which tend to slow diarrhea  Take over-the-counter Imodium (loperamide) 2 pills initially, then 1 pill 3 or 4 times daily only if needed for diarrhea. Recommend not taking it if you are not having the diarrhea.  Wear an arch foot support. These can be purchased at the Dr. Felicie Morn counter at the pharmacy.  Avoid climbing on ladders until this is well resolved.  Stretch arch multiple times daily   Return if necessary

## 2014-04-26 NOTE — Progress Notes (Signed)
Subjective: 69 year old man who has been having diarrhea since Sunday. Doesn't know any cause of it. He goes a couple of times earlier in the day, then he goes a couple of hours after each meal. It is a watery diarrhea. He's been having a lot of churning and discomfort in his stomach. No nausea or vomiting or fever. He has a diabetic, takes his medicines. He has been hurting for the last couple days in the arch of his left foot. He apparently had been working up on the lateral little bit.  Objective: No acute distress. Chest clear. Heart regular without murmurs. Abdomen has normal bowel sounds, was soft without organomegaly or masses or significant tenderness. He has some tenderness in the mid arch of the left foot.Right foot is normal  Assessment: Arch strain Diarrhea Abdominal discomfort  Plan: CBC  Results for orders placed or performed in visit on 04/26/14  POCT glucose (manual entry)  Result Value Ref Range   POC Glucose 150 (A) 70 - 99 mg/dl  POCT CBC  Result Value Ref Range   WBC 6.5 4.6 - 10.2 K/uL   Lymph, poc 1.0 0.6 - 3.4   POC LYMPH PERCENT 16.0 10 - 50 %L   MID (cbc) 0.7 0 - 0.9   POC MID % 11.1 0 - 12 %M   POC Granulocyte 4.7 2 - 6.9   Granulocyte percent 72.9 37 - 80 %G   RBC 4.89 4.69 - 6.13 M/uL   Hemoglobin 15.2 14.1 - 18.1 g/dL   HCT, POC 47.0 43.5 - 53.7 %   MCV 96.1 80 - 97 fL   MCH, POC 31.1 27 - 31.2 pg   MCHC 32.3 31.8 - 35.4 g/dL   RDW, POC 14.9 %   Platelet Count, POC 191 142 - 424 K/uL   MPV 7.2 0 - 99.8 fL   Assessment: Gastroenteritis, viral Diabetes good control Arch strain and foot pain  Plan: See instructions. No prescription medications today. Return if not improving

## 2014-06-19 ENCOUNTER — Other Ambulatory Visit (INDEPENDENT_AMBULATORY_CARE_PROVIDER_SITE_OTHER): Payer: Commercial Managed Care - HMO

## 2014-06-19 DIAGNOSIS — I10 Essential (primary) hypertension: Secondary | ICD-10-CM | POA: Diagnosis not present

## 2014-06-19 DIAGNOSIS — M109 Gout, unspecified: Secondary | ICD-10-CM

## 2014-06-19 DIAGNOSIS — E119 Type 2 diabetes mellitus without complications: Secondary | ICD-10-CM

## 2014-06-19 LAB — BASIC METABOLIC PANEL
BUN: 21 mg/dL (ref 6–23)
CO2: 27 mEq/L (ref 19–32)
Calcium: 9.3 mg/dL (ref 8.4–10.5)
Chloride: 105 mEq/L (ref 96–112)
Creatinine, Ser: 1.03 mg/dL (ref 0.40–1.50)
GFR: 76.7 mL/min (ref >60.00)
GLUCOSE: 135 mg/dL — AB (ref 70–99)
Potassium: 4.5 mEq/L (ref 3.5–5.1)
Sodium: 138 mEq/L (ref 135–145)

## 2014-06-19 LAB — HEMOGLOBIN A1C: HEMOGLOBIN A1C: 7.5 % — AB (ref 4.6–6.5)

## 2014-06-26 ENCOUNTER — Ambulatory Visit (INDEPENDENT_AMBULATORY_CARE_PROVIDER_SITE_OTHER): Payer: Commercial Managed Care - HMO | Admitting: Internal Medicine

## 2014-06-26 ENCOUNTER — Encounter: Payer: Self-pay | Admitting: Internal Medicine

## 2014-06-26 VITALS — BP 144/88 | HR 65 | Temp 97.5°F | Wt 177.8 lb

## 2014-06-26 DIAGNOSIS — E119 Type 2 diabetes mellitus without complications: Secondary | ICD-10-CM | POA: Diagnosis not present

## 2014-06-26 DIAGNOSIS — E785 Hyperlipidemia, unspecified: Secondary | ICD-10-CM

## 2014-06-26 DIAGNOSIS — R209 Unspecified disturbances of skin sensation: Secondary | ICD-10-CM

## 2014-06-26 DIAGNOSIS — I10 Essential (primary) hypertension: Secondary | ICD-10-CM | POA: Diagnosis not present

## 2014-06-26 MED ORDER — AMLODIPINE BESYLATE 5 MG PO TABS: 7.5000 mg | ORAL_TABLET | Freq: Every day | ORAL | Status: DC

## 2014-06-26 MED ORDER — METAXALONE 800 MG PO TABS: 800.0000 mg | ORAL_TABLET | Freq: Three times a day (TID) | ORAL | Status: DC | PRN

## 2014-06-26 MED ORDER — GLIMEPIRIDE 2 MG PO TABS: 4.0000 mg | ORAL_TABLET | Freq: Every day | ORAL | Status: DC

## 2014-06-26 MED ORDER — LOVASTATIN 20 MG PO TABS: 20.0000 mg | ORAL_TABLET | Freq: Every day | ORAL | Status: DC

## 2014-06-26 MED ORDER — LOSARTAN POTASSIUM 100 MG PO TABS: 100.0000 mg | ORAL_TABLET | Freq: Every day | ORAL | Status: DC

## 2014-06-26 MED ORDER — PIOGLITAZONE HCL 30 MG PO TABS: 30.0000 mg | ORAL_TABLET | Freq: Every day | ORAL | Status: DC

## 2014-06-26 NOTE — Progress Notes (Signed)
   Subjective:    HPI  The patient presents for a follow-up of  chronic hypertension, chronic dyslipidemia, type 2 diabetes and gout controlled with medicines; f/u on prosate Ca - XRT seeds  - Urol f/u q 6 mo. BP is nl at home   BP Readings from Last 3 Encounters:  06/26/14 144/88  04/26/14 130/90  02/26/14 130/85   Wt Readings from Last 3 Encounters:  06/26/14 177 lb 12 oz (80.627 kg)  04/26/14 173 lb 9.6 oz (78.744 kg)  02/26/14 174 lb (78.926 kg)       Review of Systems  Constitutional: Negative for appetite change, fatigue and unexpected weight change.  HENT: Negative for congestion, nosebleeds, sneezing, sore throat and trouble swallowing.   Eyes: Negative for itching and visual disturbance.  Respiratory: Negative for cough.   Cardiovascular: Negative for chest pain, palpitations and leg swelling.  Gastrointestinal: Negative for nausea, diarrhea, blood in stool and abdominal distention.  Genitourinary: Negative for frequency and hematuria.  Musculoskeletal: Negative for back pain, joint swelling, gait problem and neck pain.  Skin: Negative for rash.  Neurological: Negative for dizziness, tremors, speech difficulty and weakness.  Psychiatric/Behavioral: Negative for sleep disturbance, dysphoric mood and agitation. The patient is not nervous/anxious.        Objective:   Physical Exam  Constitutional: He is oriented to person, place, and time. He appears well-developed. No distress.  NAD  HENT:  Mouth/Throat: Oropharynx is clear and moist.  Eyes: Conjunctivae are normal. Pupils are equal, round, and reactive to light.  Neck: Normal range of motion. No JVD present. No thyromegaly present.  Cardiovascular: Normal rate, regular rhythm, normal heart sounds and intact distal pulses.  Exam reveals no gallop and no friction rub.   No murmur heard. Pulmonary/Chest: Effort normal and breath sounds normal. No respiratory distress. He has no wheezes. He has no rales. He  exhibits no tenderness.  Abdominal: Soft. Bowel sounds are normal. He exhibits no distension and no mass. There is no tenderness. There is no rebound and no guarding.  Musculoskeletal: Normal range of motion. He exhibits no edema or tenderness.  Lymphadenopathy:    He has no cervical adenopathy.  Neurological: He is alert and oriented to person, place, and time. He has normal reflexes. No cranial nerve deficit. He exhibits normal muscle tone. He displays a negative Romberg sign. Coordination and gait normal.  No meningeal signs  Skin: Skin is warm and dry. No rash noted.  Psychiatric: He has a normal mood and affect. His behavior is normal. Judgment and thought content normal.     Lab Results  Component Value Date   WBC 6.5 04/26/2014   HGB 15.2 04/26/2014   HCT 47.0 04/26/2014   PLT 180 10/30/2011   CHOL 169 02/20/2014   TRIG 199.0* 02/20/2014   HDL 38.50* 02/20/2014   ALT 20 10/30/2011   AST 18 10/30/2011   NA 138 06/19/2014   K 4.5 06/19/2014   CL 105 06/19/2014   CREATININE 1.03 06/19/2014   BUN 21 06/19/2014   CO2 27 06/19/2014   TSH 1.63 04/07/2011   PSA 6.73* 07/20/2011   INR CANCELED 01/18/2014   HGBA1C 7.5* 06/19/2014       Assessment & Plan:

## 2014-06-26 NOTE — Assessment & Plan Note (Signed)
On Actos and Amaryl Increased Amaryl to 4 mg/d Labs

## 2014-06-26 NOTE — Progress Notes (Signed)
Pre visit review using our clinic review tool, if applicable. No additional management support is needed unless otherwise documented below in the visit note. 

## 2014-06-26 NOTE — Assessment & Plan Note (Signed)
Resolved

## 2014-06-26 NOTE — Assessment & Plan Note (Signed)
On Amlodipine and Losartan Nl BP at home

## 2014-06-26 NOTE — Assessment & Plan Note (Signed)
On Lovastatin  Labs 

## 2014-06-27 ENCOUNTER — Telehealth: Payer: Self-pay | Admitting: Internal Medicine

## 2014-06-27 NOTE — Telephone Encounter (Signed)
emmi emailed °

## 2014-07-04 DIAGNOSIS — H25013 Cortical age-related cataract, bilateral: Secondary | ICD-10-CM | POA: Diagnosis not present

## 2014-07-04 DIAGNOSIS — E119 Type 2 diabetes mellitus without complications: Secondary | ICD-10-CM | POA: Diagnosis not present

## 2014-07-04 DIAGNOSIS — H2513 Age-related nuclear cataract, bilateral: Secondary | ICD-10-CM | POA: Diagnosis not present

## 2014-07-04 LAB — HM DIABETES EYE EXAM

## 2014-07-31 ENCOUNTER — Encounter: Payer: Self-pay | Admitting: Internal Medicine

## 2014-08-30 ENCOUNTER — Telehealth: Payer: Self-pay

## 2014-08-30 NOTE — Telephone Encounter (Signed)
Patient called to educate on Medicare Wellness apt. LVM for the patient to call back to educate and schedule for wellness visit.   

## 2014-09-04 ENCOUNTER — Telehealth: Payer: Self-pay

## 2014-09-04 NOTE — Telephone Encounter (Signed)
Patient called to educate on Medicare Wellness apt. LVM for the patient to call back to educate and schedule for wellness visit.   

## 2014-09-20 ENCOUNTER — Ambulatory Visit (INDEPENDENT_AMBULATORY_CARE_PROVIDER_SITE_OTHER): Payer: Commercial Managed Care - HMO | Admitting: Physician Assistant

## 2014-09-20 VITALS — BP 132/80 | HR 71 | Temp 97.9°F | Resp 18 | Ht 66.5 in | Wt 178.0 lb

## 2014-09-20 DIAGNOSIS — J029 Acute pharyngitis, unspecified: Secondary | ICD-10-CM | POA: Diagnosis not present

## 2014-09-20 DIAGNOSIS — R599 Enlarged lymph nodes, unspecified: Secondary | ICD-10-CM

## 2014-09-20 DIAGNOSIS — R05 Cough: Secondary | ICD-10-CM | POA: Diagnosis not present

## 2014-09-20 DIAGNOSIS — R059 Cough, unspecified: Secondary | ICD-10-CM

## 2014-09-20 DIAGNOSIS — R609 Edema, unspecified: Secondary | ICD-10-CM | POA: Diagnosis not present

## 2014-09-20 DIAGNOSIS — R600 Localized edema: Secondary | ICD-10-CM

## 2014-09-20 DIAGNOSIS — R591 Generalized enlarged lymph nodes: Secondary | ICD-10-CM

## 2014-09-20 LAB — COMPREHENSIVE METABOLIC PANEL
ALT: 22 U/L (ref 0–53)
AST: 17 U/L (ref 0–37)
Albumin: 4.2 g/dL (ref 3.5–5.2)
Alkaline Phosphatase: 88 U/L (ref 39–117)
BUN: 16 mg/dL (ref 6–23)
CHLORIDE: 106 meq/L (ref 96–112)
CO2: 24 meq/L (ref 19–32)
Calcium: 9.1 mg/dL (ref 8.4–10.5)
Creat: 0.85 mg/dL (ref 0.50–1.35)
Glucose, Bld: 169 mg/dL — ABNORMAL HIGH (ref 70–99)
Potassium: 4.2 mEq/L (ref 3.5–5.3)
SODIUM: 141 meq/L (ref 135–145)
Total Bilirubin: 0.4 mg/dL (ref 0.2–1.2)
Total Protein: 6.9 g/dL (ref 6.0–8.3)

## 2014-09-20 LAB — BRAIN NATRIURETIC PEPTIDE: Brain Natriuretic Peptide: 8.7 pg/mL (ref 0.0–100.0)

## 2014-09-20 LAB — POCT CBC
Granulocyte percent: 77.5 %G (ref 37–80)
HCT, POC: 43.4 % — AB (ref 43.5–53.7)
HEMOGLOBIN: 14.4 g/dL (ref 14.1–18.1)
Lymph, poc: 1.3 (ref 0.6–3.4)
MCH, POC: 30.6 pg (ref 27–31.2)
MCHC: 33.1 g/dL (ref 31.8–35.4)
MCV: 92.2 fL (ref 80–97)
MID (cbc): 0.6 (ref 0–0.9)
MPV: 7.3 fL (ref 0–99.8)
POC Granulocyte: 6.4 (ref 2–6.9)
POC LYMPH %: 15.8 % (ref 10–50)
POC MID %: 6.7 %M (ref 0–12)
Platelet Count, POC: 259 10*3/uL (ref 142–424)
RBC: 4.71 M/uL (ref 4.69–6.13)
RDW, POC: 14.7 %
WBC: 8.3 10*3/uL (ref 4.6–10.2)

## 2014-09-20 LAB — TSH: TSH: 2.115 u[IU]/mL (ref 0.350–4.500)

## 2014-09-20 MED ORDER — DOXYCYCLINE HYCLATE 100 MG PO CAPS: 100.0000 mg | ORAL_CAPSULE | Freq: Two times a day (BID) | ORAL | Status: DC

## 2014-09-20 NOTE — Progress Notes (Signed)
Subjective:    Patient ID: Jacob Chapman, male    DOB: 1945/12/06, 69 y.o.   MRN: [id number]  Chief Complaint  Patient presents with  . Cough    x1 week some yellow mucous   . Sore Throat    x1 week worse yesterday   . Edema    both feet-swelling since last week   . Adenopathy    behind ears last week    Patient Active Problem List   Diagnosis Date Noted  . Plantar fasciitis of right foot 02/26/2014  . Abscess or cellulitis, neck 11/09/2011  . Mass of right side of neck 11/09/2011  . Prostate cancer 09/01/2011  . Lipoma of neck 09/12/2010  . Pain in limb 09/03/2009  . CHEST PAIN, UNSPECIFIED 05/02/2008  . OSTEOARTHRITIS 09/09/2007  . LOW BACK PAIN 09/09/2007  . Dyslipidemia 05/02/2007  . GOUT 05/02/2007  . Essential hypertension 05/02/2007  . PARESTHESIA 05/02/2007  . DM2 (diabetes mellitus, type 2) 04/25/2007   Prior to Admission medications   Medication Sig Start Date End Date Taking? Authorizing Provider  amLODipine (NORVASC) 5 MG tablet Take 1.5 tablets (7.5 mg total) by mouth daily. 06/26/14  Yes Aleksei Plotnikov V, MD  aspirin 81 MG EC tablet Take 81 mg by mouth daily.    Yes Historical Provider, MD  Cholecalciferol (VITAMIN D3) 1000 UNITS tablet Take 1,000 Units by mouth daily.    Yes Historical Provider, MD  colchicine 0.6 MG tablet Take 1 tablet (0.6 mg total) by mouth 2 (two) times daily. 06/30/13  Yes Aleksei Plotnikov V, MD  glimepiride (AMARYL) 2 MG tablet Take 2 tablets (4 mg total) by mouth daily before breakfast. 06/26/14  Yes Aleksei Plotnikov V, MD  ibuprofen (ADVIL,MOTRIN) 600 MG tablet Take twice a day x 2 weeks, then prn pain 02/26/14  Yes Aleksei Plotnikov V, MD  losartan (COZAAR) 100 MG tablet Take 1 tablet (100 mg total) by mouth daily. 06/26/14  Yes Aleksei Plotnikov V, MD  lovastatin (MEVACOR) 20 MG tablet Take 1 tablet (20 mg total) by mouth at bedtime. 06/26/14  Yes Aleksei Plotnikov V, MD  metaxalone (SKELAXIN) 800 MG tablet Take 1 tablet (800 mg  total) by mouth 3 (three) times daily as needed. 06/26/14  Yes Aleksei Plotnikov V, MD  pioglitazone (ACTOS) 30 MG tablet Take 1 tablet (30 mg total) by mouth daily. 06/26/14 06/26/15 Yes Aleksei Plotnikov V, MD  vitamin B-12 (CYANOCOBALAMIN) 500 MCG tablet Take 500 mcg by mouth daily.    Yes Historical Provider, MD   Medications, allergies, past medical history, surgical history, family history, social history and problem list reviewed and updated.  HPI  73 yom with pmh htn on 2 bp meds, dm2 on 2 oral meds presents with cough, st, adenopathy, and bilateral LE edema.   No known cardiac hx.   First noticed a knot right side of neck like he had pulled a muscle about 2 wks ago. Started taking ibuprofen 600 2-3 times day for about one week for this. Normally does not take NSAIDs that often. About one week ago noticed bilateral mild feet and ankle swelling. Constant past week. Denies cp, sob, orthopnea, pnd. Was on vacation last week to [facility] and ate lot more salt than usual. Was on feet lot more than normal. This was around the time the swelling started. Has hx htn, has been on losartan 100 and amlodipine 7.5 for several yrs.   Also mentions one wk hx mildly prod cough and mild st.  Worst in the morning and during the early day. Denies fevers, chills. Denies abd pain, n/v, diarrhea. No head congestion or rhinorrhea. Denies cp or sob.   He is also concerned about some swollen lymph nodes behind his right ear. He has a known cyst on left neck but the LAN is new.   Review of Systems See HPI.     Objective:   Physical Exam  Constitutional: He is oriented to person, place, and time. He appears well-developed and well-nourished.  Non-toxic appearance. He does not have a sickly appearance. He does not appear ill. No distress.  BP 132/80 mmHg  Pulse 71  Temp(Src) 97.9 F (36.6 C) (Oral)  Resp 18  Ht 5' 6.5" (1.689 m)  Wt 178 lb (80.74 kg)  BMI 28.30 kg/m2  SpO2 98%   HENT:  Right Ear: Tympanic  membrane normal.  Left Ear: Tympanic membrane normal.  Nose: Nose normal. Right sinus exhibits no maxillary sinus tenderness and no frontal sinus tenderness. Left sinus exhibits no maxillary sinus tenderness and no frontal sinus tenderness.  Mouth/Throat: Uvula is midline, oropharynx is clear and moist and mucous membranes are normal.  Neck: No JVD present. No Brudzinski's sign noted.  Lipoma left neck. Stable per pts pmh.   Cardiovascular: Normal rate, regular rhythm and normal heart sounds.  Exam reveals no gallop.   No murmur heard. Pulses:      Dorsalis pedis pulses are 2+ on the right side, and 2+ on the left side.       Posterior tibial pulses are 2+ on the right side, and 2+ on the left side.  Trace pitting LLE up to mid shin. 1+ pitting edema RLE up to mid shin.   Pulmonary/Chest: Effort normal and breath sounds normal. No tachypnea. He has no decreased breath sounds. He has no wheezes. He has no rhonchi. He has no rales.  Abdominal: He exhibits no distension and no ascites. There is no hepatosplenomegaly.  Lymphadenopathy:       Head (right side): No submental, no submandibular and no tonsillar adenopathy present.       Head (left side): Submandibular adenopathy present. No submental and no tonsillar adenopathy present.    He has cervical adenopathy.       Right cervical: Superficial cervical adenopathy present. No deep cervical and no posterior cervical adenopathy present.      Left cervical: No superficial cervical, no deep cervical and no posterior cervical adenopathy present.  Neurological: He is alert and oriented to person, place, and time.  Skin: Skin is warm and dry. He is not diaphoretic.  Psychiatric: He has a normal mood and affect. His speech is normal and behavior is normal.   Results for orders placed or performed in visit on 09/20/14  POCT CBC  Result Value Ref Range   WBC 8.3 4.6 - 10.2 K/uL   Lymph, poc 1.3 0.6 - 3.4   POC LYMPH PERCENT 15.8 10 - 50 %L   MID  (cbc) 0.6 0 - 0.9   POC MID % 6.7 0 - 12 %M   POC Granulocyte 6.4 2 - 6.9   Granulocyte percent 77.5 37 - 80 %G   RBC 4.71 4.69 - 6.13 M/uL   Hemoglobin 14.4 14.1 - 18.1 g/dL   HCT, POC 43.4 (A) 43.5 - 53.7 %   MCV 92.2 80 - 97 fL   MCH, POC 30.6 27 - 31.2 pg   MCHC 33.1 31.8 - 35.4 g/dL   RDW, POC 14.7 %  Platelet Count, POC 259.0 142 - 424 K/uL   MPV 7.3 0 - 99.8 fL      Assessment & Plan:   77 yom with pmh htn on 2 bp meds, dm2 on 2 oral meds presents with cough, st, adenopathy, and bilateral LE edema.   Bilateral lower extremity edema - Plan: Comprehensive metabolic panel, Brain natriuretic peptide, TSH --doubt cardiac cause with no chf sx, normal cardiac/pulm exam today --checking bnp, cmp, tsh --suspect multifactorial --> recent travel with lot of sitting, increase in slat intake, and recent increase in NSAID intake --rec no NSAIDs few wks, limit Na, compression stockings, elevate legs at night --if no relief with this try to decrease amlodipine from 7.5 to 5 qd --he is planning to have cpe with his pcp at [facility] in one month, instructed to f/u with this issue with pcp, ER sooner if cp, sob, orthopnea  Lymphadenopathy of head and neck - Plan: POCT CBC, doxycycline (VIBRAMYCIN) 100 MG capsule --Enlarged LNs cervical and submandibular, likely secondary to uri as pt having cough/st --doxy bid 10 days --cbc normal today --f/u with pcp one month for reassessment  Cough Sore throat --tylenol prn, mucinex with lot of water intake bid --doxy may help as above --rtc if no improvement one week  Essential hypertension --wel controlled today --may decrease amlodipine dose as above, to f/u with pcp  Julieta Gutting, PA-C Physician Assistant-Certified [facility]  09/20/2014 5:51 PM

## 2014-09-20 NOTE — Patient Instructions (Addendum)
For the swelling, we are checking several labs today and will be in touch with you. Please try to limit the NSAIDs for the next few weeks, try to limit sodium intake, wear compression stockings during the day if you'll be standing or sitting a lot, and elevate legs at night.  If this doesn't help try cutting your amlodipine down to 5 mg daily.  If you start having chest pain, shortness of breath, or trouble lying flat come in to be seen asap or ER asap. You have swollen lymph nodes in the neck. Please take the doxycycline twice daily for 10 days.  For the sore throat and congestion, tylenol may help. Mucinex with lots of water twice daily may help.  Be sure to follow up with your PCP as scheduled early next month to follow up with the swollen lymph nodes and swelling.

## 2014-10-16 ENCOUNTER — Other Ambulatory Visit (INDEPENDENT_AMBULATORY_CARE_PROVIDER_SITE_OTHER): Payer: Commercial Managed Care - HMO

## 2014-10-16 DIAGNOSIS — E119 Type 2 diabetes mellitus without complications: Secondary | ICD-10-CM

## 2014-10-16 DIAGNOSIS — E785 Hyperlipidemia, unspecified: Secondary | ICD-10-CM

## 2014-10-16 DIAGNOSIS — I10 Essential (primary) hypertension: Secondary | ICD-10-CM

## 2014-10-16 DIAGNOSIS — R209 Unspecified disturbances of skin sensation: Secondary | ICD-10-CM

## 2014-10-16 LAB — LIPID PANEL
Cholesterol: 160 mg/dL (ref 0–200)
HDL: 46.4 mg/dL (ref >39.00)
LDL Cholesterol: 90 mg/dL (ref 0–99)
NonHDL: 113.6
TRIGLYCERIDES: 120 mg/dL (ref 0.0–149.0)
Total CHOL/HDL Ratio: 3
VLDL: 24 mg/dL (ref 0.0–40.0)

## 2014-10-16 LAB — HEPATIC FUNCTION PANEL
ALBUMIN: 4.4 g/dL (ref 3.5–5.2)
ALT: 19 U/L (ref 0–53)
AST: 19 U/L (ref 0–37)
Alkaline Phosphatase: 73 U/L (ref 39–117)
Bilirubin, Direct: 0 mg/dL (ref 0.0–0.3)
Total Bilirubin: 0.5 mg/dL (ref 0.2–1.2)
Total Protein: 7.5 g/dL (ref 6.0–8.3)

## 2014-10-16 LAB — BASIC METABOLIC PANEL
BUN: 18 mg/dL (ref 6–23)
CHLORIDE: 106 meq/L (ref 96–112)
CO2: 24 mEq/L (ref 19–32)
CREATININE: 1 mg/dL (ref 0.40–1.50)
Calcium: 9.6 mg/dL (ref 8.4–10.5)
GFR: 79.28 mL/min (ref >60.00)
Glucose, Bld: 133 mg/dL — ABNORMAL HIGH (ref 70–99)
POTASSIUM: 3.9 meq/L (ref 3.5–5.1)
Sodium: 140 mEq/L (ref 135–145)

## 2014-10-16 LAB — HEMOGLOBIN A1C: HEMOGLOBIN A1C: 7 % — AB (ref 4.6–6.5)

## 2014-10-23 ENCOUNTER — Encounter (INDEPENDENT_AMBULATORY_CARE_PROVIDER_SITE_OTHER): Payer: Self-pay

## 2014-10-23 ENCOUNTER — Encounter: Payer: Self-pay | Admitting: Internal Medicine

## 2014-10-23 ENCOUNTER — Ambulatory Visit (INDEPENDENT_AMBULATORY_CARE_PROVIDER_SITE_OTHER): Payer: Commercial Managed Care - HMO | Admitting: Internal Medicine

## 2014-10-23 VITALS — BP 154/98 | HR 61 | Wt 178.0 lb

## 2014-10-23 DIAGNOSIS — I10 Essential (primary) hypertension: Secondary | ICD-10-CM | POA: Diagnosis not present

## 2014-10-23 DIAGNOSIS — E785 Hyperlipidemia, unspecified: Secondary | ICD-10-CM | POA: Diagnosis not present

## 2014-10-23 DIAGNOSIS — M1000 Idiopathic gout, unspecified site: Secondary | ICD-10-CM | POA: Diagnosis not present

## 2014-10-23 DIAGNOSIS — E119 Type 2 diabetes mellitus without complications: Secondary | ICD-10-CM

## 2014-10-23 DIAGNOSIS — C61 Malignant neoplasm of prostate: Secondary | ICD-10-CM

## 2014-10-23 NOTE — Assessment & Plan Note (Signed)
Dr Karsten Ro q 12 mo (Nov)

## 2014-10-23 NOTE — Progress Notes (Signed)
Pre visit review using our clinic review tool, if applicable. No additional management support is needed unless otherwise documented below in the visit note. 

## 2014-10-23 NOTE — Assessment & Plan Note (Signed)
On Actos and Amaryl 

## 2014-10-23 NOTE — Assessment & Plan Note (Signed)
On Amlodipine and Losartan Nl BP at home

## 2014-10-23 NOTE — Assessment & Plan Note (Signed)
No relapse 

## 2014-10-23 NOTE — Progress Notes (Signed)
Subjective:  Patient ID: Jacob Chapman, male    DOB: 1945-09-13  Age: 69 y.o. MRN: [id number]  CC: No chief complaint on file.   HPI Jacob Chapman presents for a follow-up of  chronic hypertension, chronic dyslipidemia, type 2 diabetes controlled with medicines      Outpatient Prescriptions Prior to Visit  Medication Sig Dispense Refill  . amLODipine (NORVASC) 5 MG tablet Take 1.5 tablets (7.5 mg total) by mouth daily. 135 tablet 3  . aspirin 81 MG EC tablet Take 81 mg by mouth daily.     . Cholecalciferol (VITAMIN D3) 1000 UNITS tablet Take 1,000 Units by mouth daily.     . colchicine 0.6 MG tablet Take 1 tablet (0.6 mg total) by mouth 2 (two) times daily. 180 tablet 1  . glimepiride (AMARYL) 2 MG tablet Take 2 tablets (4 mg total) by mouth daily before breakfast. 180 tablet 3  . ibuprofen (ADVIL,MOTRIN) 600 MG tablet Take twice a day x 2 weeks, then prn pain 60 tablet 2  . losartan (COZAAR) 100 MG tablet Take 1 tablet (100 mg total) by mouth daily. 90 tablet 3  . lovastatin (MEVACOR) 20 MG tablet Take 1 tablet (20 mg total) by mouth at bedtime. 90 tablet 3  . metaxalone (SKELAXIN) 800 MG tablet Take 1 tablet (800 mg total) by mouth 3 (three) times daily as needed. 100 tablet 1  . pioglitazone (ACTOS) 30 MG tablet Take 1 tablet (30 mg total) by mouth daily. 90 tablet 3  . vitamin B-12 (CYANOCOBALAMIN) 500 MCG tablet Take 500 mcg by mouth daily.     Marland Kitchen doxycycline (VIBRAMYCIN) 100 MG capsule Take 1 capsule (100 mg total) by mouth 2 (two) times daily. 20 capsule 0   No facility-administered medications prior to visit.    ROS Review of Systems  Constitutional: Negative for appetite change, fatigue and unexpected weight change.  HENT: Negative for congestion, nosebleeds, sneezing, sore throat and trouble swallowing.   Eyes: Negative for itching and visual disturbance.  Respiratory: Negative for cough.   Cardiovascular: Negative for chest pain, palpitations and leg swelling.    Gastrointestinal: Negative for nausea, diarrhea, blood in stool and abdominal distention.  Genitourinary: Negative for frequency and hematuria.  Musculoskeletal: Negative for back pain, joint swelling, gait problem and neck pain.  Skin: Negative for rash.  Neurological: Negative for dizziness, tremors, speech difficulty and weakness.  Psychiatric/Behavioral: Negative for sleep disturbance, dysphoric mood and agitation. The patient is not nervous/anxious.     Objective:  BP 154/98 mmHg  Pulse 61  Wt 178 lb (80.74 kg)  SpO2 99%  BP Readings from Last 3 Encounters:  10/23/14 154/98  09/20/14 132/80  06/26/14 144/88    Wt Readings from Last 3 Encounters:  10/23/14 178 lb (80.74 kg)  09/20/14 178 lb (80.74 kg)  06/26/14 177 lb 12 oz (80.627 kg)    Physical Exam  Constitutional: He is oriented to person, place, and time. He appears well-developed. No distress.  NAD  HENT:  Mouth/Throat: Oropharynx is clear and moist.  Eyes: Conjunctivae are normal. Pupils are equal, round, and reactive to light.  Neck: Normal range of motion. No JVD present. No thyromegaly present.  Cardiovascular: Normal rate, regular rhythm, normal heart sounds and intact distal pulses.  Exam reveals no gallop and no friction rub.   No murmur heard. Pulmonary/Chest: Effort normal and breath sounds normal. No respiratory distress. He has no wheezes. He has no rales. He exhibits no tenderness.  Abdominal: Soft.  Bowel sounds are normal. He exhibits no distension and no mass. There is no tenderness. There is no rebound and no guarding.  Musculoskeletal: Normal range of motion. He exhibits no edema or tenderness.  Lymphadenopathy:    He has no cervical adenopathy.  Neurological: He is alert and oriented to person, place, and time. He has normal reflexes. No cranial nerve deficit. He exhibits normal muscle tone. He displays a negative Romberg sign. Coordination and gait normal.  Skin: Skin is warm and dry. No rash  noted.  Psychiatric: He has a normal mood and affect. His behavior is normal. Judgment and thought content normal.    Lab Results  Component Value Date   WBC 8.3 09/20/2014   HGB 14.4 09/20/2014   HCT 43.4* 09/20/2014   PLT 180 10/30/2011   GLUCOSE 133* 10/16/2014   CHOL 160 10/16/2014   TRIG 120.0 10/16/2014   HDL 46.40 10/16/2014   LDLCALC 90 10/16/2014   ALT 19 10/16/2014   AST 19 10/16/2014   NA 140 10/16/2014   K 3.9 10/16/2014   CL 106 10/16/2014   CREATININE 1.00 10/16/2014   BUN 18 10/16/2014   CO2 24 10/16/2014   TSH 2.115 09/20/2014   PSA 6.73* 07/20/2011   INR CANCELED 01/18/2014   HGBA1C 7.0* 10/16/2014    No results found.  Assessment & Plan:   There are no diagnoses linked to this encounter. I have discontinued Jacob Chapman doxycycline. I am also having him maintain his aspirin, vitamin B-12, cholecalciferol, colchicine, ibuprofen, amLODipine, losartan, lovastatin, metaxalone, pioglitazone, glimepiride, magnesium oxide, Cyanocobalamin (VITAMIN B12 PO), and cholecalciferol.  Meds ordered this encounter  Medications  . magnesium oxide (MAG-OX) 400 MG tablet    Sig:   . Cyanocobalamin (VITAMIN B12 PO)    Sig: daily.  . cholecalciferol (VITAMIN D) 1000 UNITS tablet    Sig: daily.     Follow-up: No Follow-up on file.  Walker Kehr, MD

## 2014-10-23 NOTE — Assessment & Plan Note (Signed)
On Lovastatin

## 2015-02-25 ENCOUNTER — Other Ambulatory Visit (INDEPENDENT_AMBULATORY_CARE_PROVIDER_SITE_OTHER): Payer: Commercial Managed Care - HMO

## 2015-02-25 DIAGNOSIS — I10 Essential (primary) hypertension: Secondary | ICD-10-CM

## 2015-02-25 DIAGNOSIS — R209 Unspecified disturbances of skin sensation: Secondary | ICD-10-CM | POA: Diagnosis not present

## 2015-02-25 DIAGNOSIS — E785 Hyperlipidemia, unspecified: Secondary | ICD-10-CM | POA: Diagnosis not present

## 2015-02-25 DIAGNOSIS — E119 Type 2 diabetes mellitus without complications: Secondary | ICD-10-CM | POA: Diagnosis not present

## 2015-02-25 LAB — LIPID PANEL
CHOL/HDL RATIO: 3
Cholesterol: 148 mg/dL (ref 0–200)
HDL: 42.4 mg/dL (ref >39.00)
LDL Cholesterol: 86 mg/dL (ref 0–99)
NonHDL: 105.95
TRIGLYCERIDES: 98 mg/dL (ref 0.0–149.0)
VLDL: 19.6 mg/dL (ref 0.0–40.0)

## 2015-02-25 LAB — BASIC METABOLIC PANEL
BUN: 20 mg/dL (ref 6–23)
CHLORIDE: 106 meq/L (ref 96–112)
CO2: 25 mEq/L (ref 19–32)
Calcium: 9.5 mg/dL (ref 8.4–10.5)
Creatinine, Ser: 1.04 mg/dL (ref 0.40–1.50)
GFR: 75.69 mL/min (ref >60.00)
Glucose, Bld: 135 mg/dL — ABNORMAL HIGH (ref 70–99)
Potassium: 4.7 mEq/L (ref 3.5–5.1)
Sodium: 140 mEq/L (ref 135–145)

## 2015-02-25 LAB — HEPATIC FUNCTION PANEL
ALBUMIN: 4.1 g/dL (ref 3.5–5.2)
ALK PHOS: 77 U/L (ref 39–117)
ALT: 18 U/L (ref 0–53)
AST: 17 U/L (ref 0–37)
BILIRUBIN DIRECT: 0.1 mg/dL (ref 0.0–0.3)
Total Bilirubin: 0.5 mg/dL (ref 0.2–1.2)
Total Protein: 7 g/dL (ref 6.0–8.3)

## 2015-02-25 LAB — HEMOGLOBIN A1C: HEMOGLOBIN A1C: 7.2 % — AB (ref 4.6–6.5)

## 2015-03-05 ENCOUNTER — Encounter: Payer: Self-pay | Admitting: Internal Medicine

## 2015-03-05 ENCOUNTER — Ambulatory Visit (INDEPENDENT_AMBULATORY_CARE_PROVIDER_SITE_OTHER): Payer: Commercial Managed Care - HMO | Admitting: Internal Medicine

## 2015-03-05 VITALS — BP 139/80 | HR 78 | Wt 175.0 lb

## 2015-03-05 DIAGNOSIS — M1000 Idiopathic gout, unspecified site: Secondary | ICD-10-CM | POA: Diagnosis not present

## 2015-03-05 DIAGNOSIS — I10 Essential (primary) hypertension: Secondary | ICD-10-CM

## 2015-03-05 DIAGNOSIS — C61 Malignant neoplasm of prostate: Secondary | ICD-10-CM

## 2015-03-05 DIAGNOSIS — E119 Type 2 diabetes mellitus without complications: Secondary | ICD-10-CM

## 2015-03-05 MED ORDER — IBUPROFEN 800 MG PO TABS: 800.0000 mg | ORAL_TABLET | Freq: Three times a day (TID) | ORAL | Status: DC | PRN

## 2015-03-05 MED ORDER — GLIMEPIRIDE 2 MG PO TABS: 4.0000 mg | ORAL_TABLET | Freq: Every day | ORAL | Status: DC

## 2015-03-05 NOTE — Progress Notes (Signed)
Subjective:  Patient ID: Jacob Chapman, male    DOB: 08/15/1945  Age: 69 y.o. MRN: [id number]  CC: No chief complaint on file.   HPI Jacob Chapman presents for HTN, DM, dyslipidemia f/u  Outpatient Prescriptions Prior to Visit  Medication Sig Dispense Refill  . amLODipine (NORVASC) 5 MG tablet Take 1.5 tablets (7.5 mg total) by mouth daily. 135 tablet 3  . aspirin 81 MG EC tablet Take 81 mg by mouth daily.     . cholecalciferol (VITAMIN D) 1000 UNITS tablet daily.    . Cholecalciferol (VITAMIN D3) 1000 UNITS tablet Take 1,000 Units by mouth daily.     . colchicine 0.6 MG tablet Take 1 tablet (0.6 mg total) by mouth 2 (two) times daily. 180 tablet 1  . Cyanocobalamin (VITAMIN B12 PO) daily.    Marland Kitchen glimepiride (AMARYL) 2 MG tablet Take 2 tablets (4 mg total) by mouth daily before breakfast. 180 tablet 3  . losartan (COZAAR) 100 MG tablet Take 1 tablet (100 mg total) by mouth daily. 90 tablet 3  . lovastatin (MEVACOR) 20 MG tablet Take 1 tablet (20 mg total) by mouth at bedtime. 90 tablet 3  . magnesium oxide (MAG-OX) 400 MG tablet     . metaxalone (SKELAXIN) 800 MG tablet Take 1 tablet (800 mg total) by mouth 3 (three) times daily as needed. 100 tablet 1  . pioglitazone (ACTOS) 30 MG tablet Take 1 tablet (30 mg total) by mouth daily. 90 tablet 3  . vitamin B-12 (CYANOCOBALAMIN) 500 MCG tablet Take 500 mcg by mouth daily.     Marland Kitchen ibuprofen (ADVIL,MOTRIN) 600 MG tablet Take twice a day x 2 weeks, then prn pain 60 tablet 2   No facility-administered medications prior to visit.    ROS Review of Systems  Constitutional: Negative for appetite change, fatigue and unexpected weight change.  HENT: Negative for congestion, nosebleeds, sneezing, sore throat and trouble swallowing.   Eyes: Negative for itching and visual disturbance.  Respiratory: Negative for cough.   Cardiovascular: Negative for chest pain, palpitations and leg swelling.  Gastrointestinal: Negative for nausea, diarrhea,  blood in stool and abdominal distention.  Genitourinary: Negative for frequency and hematuria.  Musculoskeletal: Negative for back pain, joint swelling, gait problem and neck pain.  Skin: Negative for rash.  Neurological: Negative for dizziness, tremors, speech difficulty and weakness.  Psychiatric/Behavioral: Negative for sleep disturbance, dysphoric mood and agitation. The patient is not nervous/anxious.     Objective:  BP 139/80 mmHg  Pulse 78  Wt 175 lb (79.379 kg)  SpO2 95%  BP Readings from Last 3 Encounters:  03/05/15 139/80  10/23/14 154/98  09/20/14 132/80    Wt Readings from Last 3 Encounters:  03/05/15 175 lb (79.379 kg)  10/23/14 178 lb (80.74 kg)  09/20/14 178 lb (80.74 kg)    Physical Exam  Constitutional: He is oriented to person, place, and time. He appears well-developed. No distress.  NAD  HENT:  Mouth/Throat: Oropharynx is clear and moist.  Eyes: Conjunctivae are normal. Pupils are equal, round, and reactive to light.  Neck: Normal range of motion. No JVD present. No thyromegaly present.  Cardiovascular: Normal rate, regular rhythm, normal heart sounds and intact distal pulses.  Exam reveals no gallop and no friction rub.   No murmur heard. Pulmonary/Chest: Effort normal and breath sounds normal. No respiratory distress. He has no wheezes. He has no rales. He exhibits no tenderness.  Abdominal: Soft. Bowel sounds are normal. He exhibits no distension  and no mass. There is no tenderness. There is no rebound and no guarding.  Musculoskeletal: Normal range of motion. He exhibits no edema or tenderness.  Lymphadenopathy:    He has no cervical adenopathy.  Neurological: He is alert and oriented to person, place, and time. He has normal reflexes. No cranial nerve deficit. He exhibits normal muscle tone. He displays a negative Romberg sign. Coordination and gait normal.  Skin: Skin is warm and dry. No rash noted.  Psychiatric: He has a normal mood and affect. His  behavior is normal. Judgment and thought content normal.    Lab Results  Component Value Date   WBC 8.3 09/20/2014   HGB 14.4 09/20/2014   HCT 43.4* 09/20/2014   PLT 180 10/30/2011   GLUCOSE 135* 02/25/2015   CHOL 148 02/25/2015   TRIG 98.0 02/25/2015   HDL 42.40 02/25/2015   LDLCALC 86 02/25/2015   ALT 18 02/25/2015   AST 17 02/25/2015   NA 140 02/25/2015   K 4.7 02/25/2015   CL 106 02/25/2015   CREATININE 1.04 02/25/2015   BUN 20 02/25/2015   CO2 25 02/25/2015   TSH 2.115 09/20/2014   PSA 6.73* 07/20/2011   INR CANCELED 01/18/2014   HGBA1C 7.2* 02/25/2015    No results found.  Assessment & Plan:   Diagnoses and all orders for this visit:  Essential hypertension  Type 2 diabetes mellitus without complication, without long-term current use of insulin (HCC)  Prostate cancer (HCC)  Idiopathic gout, unspecified chronicity, unspecified site  Other orders -     glimepiride (AMARYL) 2 MG tablet; Take 2 tablets (4 mg total) by mouth daily before breakfast. -     ibuprofen (ADVIL,MOTRIN) 800 MG tablet; Take 1 tablet (800 mg total) by mouth 3 (three) times daily as needed.  I am having Mr. Steeno maintain his aspirin, vitamin B-12, cholecalciferol, colchicine, amLODipine, losartan, lovastatin, metaxalone, pioglitazone, glimepiride, magnesium oxide, Cyanocobalamin (VITAMIN B12 PO), cholecalciferol, and ibuprofen.  Meds ordered this encounter  Medications  . ibuprofen (ADVIL,MOTRIN) 800 MG tablet    Sig: Take 800 mg by mouth 3 (three) times daily as needed.     Follow-up: Return in about 4 months (around 07/03/2015) for a follow-up visit.  Walker Kehr, MD

## 2015-03-05 NOTE — Progress Notes (Signed)
Pre visit review using our clinic review tool, if applicable. No additional management support is needed unless otherwise documented below in the visit note. 

## 2015-03-05 NOTE — Assessment & Plan Note (Signed)
Dr Karsten Ro q 12 mo

## 2015-03-05 NOTE — Assessment & Plan Note (Signed)
Doing well 

## 2015-03-05 NOTE — Assessment & Plan Note (Addendum)
Doing well On Amlodipine and Losartan Nl BP at home

## 2015-03-05 NOTE — Assessment & Plan Note (Signed)
On Actos and Amaryl A1c 7.2% - improve diet

## 2015-03-11 ENCOUNTER — Telehealth: Payer: Self-pay | Admitting: Internal Medicine

## 2015-03-11 NOTE — Telephone Encounter (Signed)
Pt request [employer] referral to go to Dr. Eliseo Gum? (prostated doctor), pt has an appt on 03/19/15 for yearly check up. Please get this done.

## 2015-03-20 NOTE — Telephone Encounter (Signed)
Done

## 2015-03-26 DIAGNOSIS — Z8546 Personal history of malignant neoplasm of prostate: Secondary | ICD-10-CM | POA: Diagnosis not present

## 2015-04-01 ENCOUNTER — Other Ambulatory Visit: Payer: Self-pay | Admitting: Internal Medicine

## 2015-06-25 ENCOUNTER — Other Ambulatory Visit (INDEPENDENT_AMBULATORY_CARE_PROVIDER_SITE_OTHER): Payer: Commercial Managed Care - HMO

## 2015-06-25 DIAGNOSIS — R209 Unspecified disturbances of skin sensation: Secondary | ICD-10-CM

## 2015-06-25 DIAGNOSIS — I10 Essential (primary) hypertension: Secondary | ICD-10-CM

## 2015-06-25 DIAGNOSIS — E785 Hyperlipidemia, unspecified: Secondary | ICD-10-CM | POA: Diagnosis not present

## 2015-06-25 DIAGNOSIS — E119 Type 2 diabetes mellitus without complications: Secondary | ICD-10-CM | POA: Diagnosis not present

## 2015-06-25 LAB — HEPATIC FUNCTION PANEL
ALBUMIN: 4.3 g/dL (ref 3.5–5.2)
ALK PHOS: 80 U/L (ref 39–117)
ALT: 20 U/L (ref 0–53)
AST: 19 U/L (ref 0–37)
BILIRUBIN DIRECT: 0.1 mg/dL (ref 0.0–0.3)
Total Bilirubin: 0.5 mg/dL (ref 0.2–1.2)
Total Protein: 7.1 g/dL (ref 6.0–8.3)

## 2015-06-25 LAB — BASIC METABOLIC PANEL
BUN: 16 mg/dL (ref 6–23)
CHLORIDE: 106 meq/L (ref 96–112)
CO2: 25 mEq/L (ref 19–32)
Calcium: 9.7 mg/dL (ref 8.4–10.5)
Creatinine, Ser: 0.97 mg/dL (ref 0.40–1.50)
GFR: 81.95 mL/min (ref >60.00)
GLUCOSE: 135 mg/dL — AB (ref 70–99)
POTASSIUM: 4.6 meq/L (ref 3.5–5.1)
SODIUM: 142 meq/L (ref 135–145)

## 2015-06-25 LAB — LIPID PANEL
CHOLESTEROL: 156 mg/dL (ref 0–200)
HDL: 50.4 mg/dL (ref >39.00)
LDL Cholesterol: 87 mg/dL (ref 0–99)
NonHDL: 105.24
TRIGLYCERIDES: 90 mg/dL (ref 0.0–149.0)
Total CHOL/HDL Ratio: 3
VLDL: 18 mg/dL (ref 0.0–40.0)

## 2015-06-25 LAB — HEMOGLOBIN A1C: HEMOGLOBIN A1C: 7.3 % — AB (ref 4.6–6.5)

## 2015-07-02 ENCOUNTER — Ambulatory Visit (INDEPENDENT_AMBULATORY_CARE_PROVIDER_SITE_OTHER): Payer: Commercial Managed Care - HMO | Admitting: Internal Medicine

## 2015-07-02 ENCOUNTER — Encounter: Payer: Self-pay | Admitting: Internal Medicine

## 2015-07-02 VITALS — BP 136/80 | HR 64 | Wt 176.0 lb

## 2015-07-02 DIAGNOSIS — Z1211 Encounter for screening for malignant neoplasm of colon: Secondary | ICD-10-CM | POA: Diagnosis not present

## 2015-07-02 DIAGNOSIS — M1000 Idiopathic gout, unspecified site: Secondary | ICD-10-CM | POA: Diagnosis not present

## 2015-07-02 DIAGNOSIS — I10 Essential (primary) hypertension: Secondary | ICD-10-CM

## 2015-07-02 DIAGNOSIS — E119 Type 2 diabetes mellitus without complications: Secondary | ICD-10-CM | POA: Diagnosis not present

## 2015-07-02 MED ORDER — AMLODIPINE BESYLATE 5 MG PO TABS: 7.5000 mg | ORAL_TABLET | Freq: Every day | ORAL | Status: DC

## 2015-07-02 MED ORDER — LOVASTATIN 20 MG PO TABS: 20.0000 mg | ORAL_TABLET | Freq: Every day | ORAL | Status: DC

## 2015-07-02 MED ORDER — PIOGLITAZONE HCL 30 MG PO TABS: 30.0000 mg | ORAL_TABLET | Freq: Every day | ORAL | Status: DC

## 2015-07-02 MED ORDER — LOSARTAN POTASSIUM 100 MG PO TABS: 100.0000 mg | ORAL_TABLET | Freq: Every day | ORAL | Status: DC

## 2015-07-02 MED ORDER — GLIMEPIRIDE 2 MG PO TABS: 4.0000 mg | ORAL_TABLET | Freq: Every day | ORAL | Status: DC

## 2015-07-02 NOTE — Assessment & Plan Note (Signed)
Chronic On Amlodipine and Losartan Nl BP at home

## 2015-07-02 NOTE — Progress Notes (Signed)
Subjective:  Patient ID: Jacob Chapman, male    DOB: Dec 31, 1945  Age: 70 y.o. MRN: [id number]  CC: No chief complaint on file.   HPI Jacob Chapman presents for DM, HTN, gout f/u  Outpatient Prescriptions Prior to Visit  Medication Sig Dispense Refill  . acyclovir (ZOVIRAX) 400 MG tablet TAKE 1 TABLET BY MOUTH 3 TIMES DAILY FOR COLD SORES 21 tablet 3  . aspirin 81 MG EC tablet Take 81 mg by mouth daily.     . cholecalciferol (VITAMIN D) 1000 UNITS tablet daily.    . colchicine 0.6 MG tablet Take 1 tablet (0.6 mg total) by mouth 2 (two) times daily. 180 tablet 1  . Cyanocobalamin (VITAMIN B12 PO) daily.    Marland Kitchen ibuprofen (ADVIL,MOTRIN) 800 MG tablet Take 1 tablet (800 mg total) by mouth 3 (three) times daily as needed. 90 tablet 1  . magnesium oxide (MAG-OX) 400 MG tablet     . metaxalone (SKELAXIN) 800 MG tablet Take 1 tablet (800 mg total) by mouth 3 (three) times daily as needed. 100 tablet 1  . vitamin B-12 (CYANOCOBALAMIN) 500 MCG tablet Take 500 mcg by mouth daily.     Marland Kitchen amLODipine (NORVASC) 5 MG tablet Take 1.5 tablets (7.5 mg total) by mouth daily. 135 tablet 3  . glimepiride (AMARYL) 2 MG tablet Take 2 tablets (4 mg total) by mouth daily before breakfast. 180 tablet 3  . losartan (COZAAR) 100 MG tablet Take 1 tablet (100 mg total) by mouth daily. 90 tablet 3  . lovastatin (MEVACOR) 20 MG tablet Take 1 tablet (20 mg total) by mouth at bedtime. 90 tablet 3  . pioglitazone (ACTOS) 30 MG tablet Take 1 tablet (30 mg total) by mouth daily. 90 tablet 3   No facility-administered medications prior to visit.    ROS Review of Systems  Constitutional: Negative for appetite change, fatigue and unexpected weight change.  HENT: Negative for congestion, nosebleeds, sneezing, sore throat and trouble swallowing.   Eyes: Negative for itching and visual disturbance.  Respiratory: Negative for cough.   Cardiovascular: Negative for chest pain, palpitations and leg swelling.    Gastrointestinal: Negative for nausea, diarrhea, blood in stool and abdominal distention.  Genitourinary: Negative for frequency and hematuria.  Musculoskeletal: Negative for back pain, joint swelling, gait problem and neck pain.  Skin: Negative for rash.  Neurological: Negative for dizziness, tremors, speech difficulty and weakness.  Psychiatric/Behavioral: Negative for sleep disturbance, dysphoric mood and agitation. The patient is not nervous/anxious.     Objective:  BP 136/80 mmHg  Pulse 64  Wt 176 lb (79.833 kg)  SpO2 95%  BP Readings from Last 3 Encounters:  07/02/15 136/80  03/05/15 139/80  10/23/14 154/98    Wt Readings from Last 3 Encounters:  07/02/15 176 lb (79.833 kg)  03/05/15 175 lb (79.379 kg)  10/23/14 178 lb (80.74 kg)    Physical Exam  Constitutional: He is oriented to person, place, and time. He appears well-developed. No distress.  NAD  HENT:  Mouth/Throat: Oropharynx is clear and moist.  Eyes: Conjunctivae are normal. Pupils are equal, round, and reactive to light.  Neck: Normal range of motion. No JVD present. No thyromegaly present.  Cardiovascular: Normal rate, regular rhythm, normal heart sounds and intact distal pulses.  Exam reveals no gallop and no friction rub.   No murmur heard. Pulmonary/Chest: Effort normal and breath sounds normal. No respiratory distress. He has no wheezes. He has no rales. He exhibits no tenderness.  Abdominal:  Soft. Bowel sounds are normal. He exhibits no distension and no mass. There is no tenderness. There is no rebound and no guarding.  Musculoskeletal: Normal range of motion. He exhibits no edema or tenderness.  Lymphadenopathy:    He has no cervical adenopathy.  Neurological: He is alert and oriented to person, place, and time. He has normal reflexes. No cranial nerve deficit. He exhibits normal muscle tone. He displays a negative Romberg sign. Coordination and gait normal.  Skin: Skin is warm and dry. No rash noted.   Psychiatric: He has a normal mood and affect. His behavior is normal. Judgment and thought content normal.    Lab Results  Component Value Date   WBC 8.3 09/20/2014   HGB 14.4 09/20/2014   HCT 43.4* 09/20/2014   PLT 180 10/30/2011   GLUCOSE 135* 06/25/2015   CHOL 156 06/25/2015   TRIG 90.0 06/25/2015   HDL 50.40 06/25/2015   LDLCALC 87 06/25/2015   ALT 20 06/25/2015   AST 19 06/25/2015   NA 142 06/25/2015   K 4.6 06/25/2015   CL 106 06/25/2015   CREATININE 0.97 06/25/2015   BUN 16 06/25/2015   CO2 25 06/25/2015   TSH 2.115 09/20/2014   PSA 6.73* 07/20/2011   INR CANCELED 01/18/2014   HGBA1C 7.3* 06/25/2015    No results found.  Assessment & Plan:   Diagnoses and all orders for this visit:  Type 2 diabetes mellitus without complication, without long-term current use of insulin (HCC)  Essential hypertension  Idiopathic gout, unspecified chronicity, unspecified site  Other orders -     amLODipine (NORVASC) 5 MG tablet; Take 1.5 tablets (7.5 mg total) by mouth daily. -     glimepiride (AMARYL) 2 MG tablet; Take 2 tablets (4 mg total) by mouth daily before breakfast. -     pioglitazone (ACTOS) 30 MG tablet; Take 1 tablet (30 mg total) by mouth daily. -     lovastatin (MEVACOR) 20 MG tablet; Take 1 tablet (20 mg total) by mouth at bedtime. -     losartan (COZAAR) 100 MG tablet; Take 1 tablet (100 mg total) by mouth daily.  I am having [name] maintain his aspirin, vitamin B-12, colchicine, metaxalone, magnesium oxide, Cyanocobalamin (VITAMIN B12 PO), cholecalciferol, ibuprofen, acyclovir, amLODipine, glimepiride, pioglitazone, lovastatin, and losartan.  Meds ordered this encounter  Medications  . amLODipine (NORVASC) 5 MG tablet    Sig: Take 1.5 tablets (7.5 mg total) by mouth daily.    Dispense:  135 tablet    Refill:  3  . glimepiride (AMARYL) 2 MG tablet    Sig: Take 2 tablets (4 mg total) by mouth daily before breakfast.    Dispense:  180 tablet    Refill:   3  . pioglitazone (ACTOS) 30 MG tablet    Sig: Take 1 tablet (30 mg total) by mouth daily.    Dispense:  90 tablet    Refill:  3  . lovastatin (MEVACOR) 20 MG tablet    Sig: Take 1 tablet (20 mg total) by mouth at bedtime.    Dispense:  90 tablet    Refill:  3  . losartan (COZAAR) 100 MG tablet    Sig: Take 1 tablet (100 mg total) by mouth daily.    Dispense:  90 tablet    Refill:  3     Follow-up: Return in about 3 months (around 10/02/2015) for a follow-up visit.  Walker Kehr, MD

## 2015-07-02 NOTE — Progress Notes (Signed)
Pre visit review using our clinic review tool, if applicable. No additional management support is needed unless otherwise documented below in the visit note. 

## 2015-07-02 NOTE — Assessment & Plan Note (Signed)
Chronic  On Actos and Amaryl

## 2015-07-02 NOTE — Assessment & Plan Note (Addendum)
No relapse Losartan, diet

## 2015-07-03 ENCOUNTER — Encounter: Payer: Self-pay | Admitting: Internal Medicine

## 2015-07-04 ENCOUNTER — Encounter: Payer: Self-pay | Admitting: Gastroenterology

## 2015-08-07 ENCOUNTER — Ambulatory Visit (AMBULATORY_SURGERY_CENTER): Payer: Self-pay | Admitting: *Deleted

## 2015-08-07 VITALS — Ht 66.0 in | Wt 178.4 lb

## 2015-08-07 DIAGNOSIS — Z1211 Encounter for screening for malignant neoplasm of colon: Secondary | ICD-10-CM

## 2015-08-07 NOTE — Progress Notes (Signed)
Patient denies any allergies to egg or soy products. Patient denies complications with anesthesia/sedation.  Patient denies oxygen use at home and denies diet medications. Emmi instructions for colonoscopy explained but patient denied.     

## 2015-08-21 ENCOUNTER — Encounter: Payer: Self-pay | Admitting: Internal Medicine

## 2015-08-21 ENCOUNTER — Ambulatory Visit (AMBULATORY_SURGERY_CENTER): Payer: Commercial Managed Care - HMO | Admitting: Internal Medicine

## 2015-08-21 VITALS — BP 110/76 | HR 53 | Temp 97.7°F | Resp 14 | Ht 66.0 in | Wt 178.0 lb

## 2015-08-21 DIAGNOSIS — D123 Benign neoplasm of transverse colon: Secondary | ICD-10-CM | POA: Diagnosis not present

## 2015-08-21 DIAGNOSIS — K635 Polyp of colon: Secondary | ICD-10-CM | POA: Diagnosis not present

## 2015-08-21 DIAGNOSIS — Z1211 Encounter for screening for malignant neoplasm of colon: Secondary | ICD-10-CM | POA: Diagnosis present

## 2015-08-21 LAB — GLUCOSE, CAPILLARY
GLUCOSE-CAPILLARY: 118 mg/dL — AB (ref 65–99)
GLUCOSE-CAPILLARY: 105 mg/dL — AB (ref 65–99)

## 2015-08-21 MED ORDER — SODIUM CHLORIDE 0.9 % IV SOLN: 500.0000 mL | INTRAVENOUS | Status: DC

## 2015-08-21 NOTE — Progress Notes (Signed)
A and Ox 3 Report to RN 

## 2015-08-21 NOTE — Progress Notes (Signed)
Called to room to assist during endoscopic procedure.  Patient ID and intended procedure confirmed with present staff. Received instructions for my participation in the procedure from the performing physician.  

## 2015-08-21 NOTE — Op Note (Addendum)
[place] Patient Name: Jacob Chapman Procedure Date: 08/21/2015 8:27 AM MRN: [id number] Endoscopist: Gatha Mayer , MD Age: 70 Date of Birth: [birth year] Gender: Male Procedure:                Colonoscopy Indications:              Screening for colorectal malignant neoplasm Medicines:                Propofol per Anesthesia, Monitored Anesthesia Care Procedure:                Pre-Anesthesia Assessment:                           - Prior to the procedure, a History and Physical                            was performed, and patient medications and                            allergies were reviewed. The patient's tolerance of                            previous anesthesia was also reviewed. The risks                            and benefits of the procedure and the sedation                            options and risks were discussed with the patient.                            All questions were answered, and informed consent                            was obtained. ASA Grade Assessment: II - A patient                            with mild systemic disease. After reviewing the                            risks and benefits, the patient was deemed in                            satisfactory condition to undergo the procedure.                           After obtaining informed consent, the colonoscope                            was passed under direct vision. Throughout the                            procedure, the patient's blood pressure, pulse, and  oxygen saturations were monitored continuously. The                            Model CF-HQ190L [id number]) scope was introduced                            through the anus and advanced to the the cecum,                            identified by appendiceal orifice and ileocecal                            valve. The ileocecal valve, appendiceal orifice,                            and rectum were photographed.  The quality of the                            bowel preparation was excellent. The bowel                            preparation used was Miralax. Scope In: 8:44:04 AM Scope Out: 8:59:43 AM Scope Withdrawal Time: 0 hours 12 minutes 24 seconds  Total Procedure Duration: 0 hours 15 minutes 39 seconds  Findings:                 A 3 mm polyp was found in the transverse colon. The                            polyp was sessile. The polyp was removed with a                            cold snare. Resection and retrieval were complete.                            Verification of patient identification for the                            specimen was done. Estimated blood loss was minimal.                           A few diverticula were found in the sigmoid colon.                            There was no evidence of diverticular bleeding.                           The exam was otherwise without abnormality on                            direct and retroflexion views. Complications:            No immediate complications. Estimated blood loss:  None. Estimated Blood Loss:     Estimated blood loss: none. Impression:               - One 3 mm polyp in the transverse colon, removed                            with a cold snare. Resected and retrieved.                           - Mild diverticulosis in the sigmoid colon. There                            was no evidence of diverticular bleeding. Recommendation:           - Patient has a contact number available for                            emergencies. The signs and symptoms of potential                            delayed complications were discussed with the                            patient. Return to normal activities tomorrow.                            Written discharge instructions were provided to the                            patient.                           - Resume previous diet.                           - Continue  present medications.                           - Repeat colonoscopy is recommended. The                            colonoscopy date will be determined after pathology                            results from today's exam become available for                            review. Gatha Mayer, MD 08/21/2015 9:04:25 AM This report has been signed electronically. CC Letter to:             Evie Lacks. Plotnikov, MD Addendum Number: 1   Addendum Date: 08/21/2015 9:39:17 AM      Perianal and DRE exams normal. Gatha Mayer, MD 08/21/2015 9:39:30 AM This report has been signed electronically.

## 2015-08-21 NOTE — Patient Instructions (Addendum)
I found and removed one tiny polyp. I will let you know pathology results and when/if to have another routine colonoscopy by mail.  You also have a condition called diverticulosis - common and not usually a problem. Please read the handout provided.  I appreciate the opportunity to care for you. Gatha Mayer, MD, FACG  YOU HAD AN ENDOSCOPIC PROCEDURE TODAY AT [facility]:   Refer to the procedure report that was given to you for any specific questions about what was found during the examination.  If the procedure report does not answer your questions, please call your gastroenterologist to clarify.  If you requested that your care partner not be given the details of your procedure findings, then the procedure report has been included in a sealed envelope for you to review at your convenience later.  YOU SHOULD EXPECT: Some feelings of bloating in the abdomen. Passage of more gas than usual.  Walking can help get rid of the air that was put into your GI tract during the procedure and reduce the bloating. If you had a lower endoscopy (such as a colonoscopy or flexible sigmoidoscopy) you may notice spotting of blood in your stool or on the toilet paper. If you underwent a bowel prep for your procedure, you may not have a normal bowel movement for a few days.  Please Note:  You might notice some irritation and congestion in your nose or some drainage.  This is from the oxygen used during your procedure.  There is no need for concern and it should clear up in a day or so.  SYMPTOMS TO REPORT IMMEDIATELY:   Following lower endoscopy (colonoscopy or flexible sigmoidoscopy):  Excessive amounts of blood in the stool  Significant tenderness or worsening of abdominal pains  Swelling of the abdomen that is new, acute  Fever of 100F or higher    For urgent or emergent issues, a gastroenterologist can be reached at any hour by calling [phone].   DIET: Your first  meal following the procedure should be a small meal and then it is ok to progress to your normal diet. Heavy or fried foods are harder to digest and may make you feel nauseous or bloated.  Likewise, meals heavy in dairy and vegetables can increase bloating.  Drink plenty of fluids but you should avoid alcoholic beverages for 24 hours.  ACTIVITY:  You should plan to take it easy for the rest of today and you should NOT DRIVE or use heavy machinery until tomorrow (because of the sedation medicines used during the test).    FOLLOW UP: Our staff will call the number listed on your records the next business day following your procedure to check on you and address any questions or concerns that you may have regarding the information given to you following your procedure. If we do not reach you, we will leave a message.  However, if you are feeling well and you are not experiencing any problems, there is no need to return our call.  We will assume that you have returned to your regular daily activities without incident.  If any biopsies were taken you will be contacted by phone or by letter within the next 1-3 weeks.  Please call us at [phone] if you have not heard about the biopsies in 3 weeks.    SIGNATURES/CONFIDENTIALITY: You and/or your care partner have signed paperwork which will be entered into your electronic medical record.  These signatures attest  to the fact that that the information above on your After Visit Summary has been reviewed and is understood.  Full responsibility of the confidentiality of this discharge information lies with you and/or your care-partner.   Information on polyps and diverticulosis given to you today

## 2015-08-22 ENCOUNTER — Telehealth: Payer: Self-pay

## 2015-08-22 NOTE — Telephone Encounter (Signed)
  Follow up Call-  Call back number 08/21/2015  Post procedure Call Back phone  # [phone]  Permission to leave phone message Yes     Patient questions:  Do you have a fever, pain , or abdominal swelling? No. Pain Score  0 *  Have you tolerated food without any problems? Yes.    Have you been able to return to your normal activities? Yes.    Do you have any questions about your discharge instructions: Diet   No. Medications  No. Follow up visit  No.  Do you have questions or concerns about your Care? No.  Actions: * If pain score is 4 or above: No action needed, pain <4.

## 2015-08-28 DIAGNOSIS — E119 Type 2 diabetes mellitus without complications: Secondary | ICD-10-CM | POA: Diagnosis not present

## 2015-08-28 DIAGNOSIS — H2513 Age-related nuclear cataract, bilateral: Secondary | ICD-10-CM | POA: Diagnosis not present

## 2015-08-28 LAB — HM DIABETES EYE EXAM

## 2015-08-29 ENCOUNTER — Encounter: Payer: Self-pay | Admitting: Internal Medicine

## 2015-09-12 ENCOUNTER — Encounter: Payer: Self-pay | Admitting: Internal Medicine

## 2015-09-12 NOTE — Progress Notes (Signed)
Quick Note:  Benign mucosal polyp recall 2027 ______

## 2015-09-30 ENCOUNTER — Other Ambulatory Visit (INDEPENDENT_AMBULATORY_CARE_PROVIDER_SITE_OTHER): Payer: Commercial Managed Care - HMO

## 2015-09-30 DIAGNOSIS — E119 Type 2 diabetes mellitus without complications: Secondary | ICD-10-CM | POA: Diagnosis not present

## 2015-09-30 DIAGNOSIS — I10 Essential (primary) hypertension: Secondary | ICD-10-CM

## 2015-09-30 DIAGNOSIS — R209 Unspecified disturbances of skin sensation: Secondary | ICD-10-CM | POA: Diagnosis not present

## 2015-09-30 DIAGNOSIS — E785 Hyperlipidemia, unspecified: Secondary | ICD-10-CM | POA: Diagnosis not present

## 2015-09-30 LAB — LIPID PANEL
Cholesterol: 128 mg/dL (ref 0–200)
HDL: 41.4 mg/dL (ref >39.00)
LDL Cholesterol: 70 mg/dL (ref 0–99)
NONHDL: 86.56
TRIGLYCERIDES: 83 mg/dL (ref 0.0–149.0)
Total CHOL/HDL Ratio: 3
VLDL: 16.6 mg/dL (ref 0.0–40.0)

## 2015-09-30 LAB — BASIC METABOLIC PANEL
BUN: 14 mg/dL (ref 6–23)
CHLORIDE: 107 meq/L (ref 96–112)
CO2: 26 meq/L (ref 19–32)
CREATININE: 1.01 mg/dL (ref 0.40–1.50)
Calcium: 9.2 mg/dL (ref 8.4–10.5)
GFR: 78.15 mL/min (ref >60.00)
Glucose, Bld: 126 mg/dL — ABNORMAL HIGH (ref 70–99)
POTASSIUM: 4.2 meq/L (ref 3.5–5.1)
Sodium: 141 mEq/L (ref 135–145)

## 2015-09-30 LAB — HEMOGLOBIN A1C: Hgb A1c MFr Bld: 7.4 % — ABNORMAL HIGH (ref 4.6–6.5)

## 2015-10-08 ENCOUNTER — Ambulatory Visit (INDEPENDENT_AMBULATORY_CARE_PROVIDER_SITE_OTHER): Payer: Commercial Managed Care - HMO | Admitting: Internal Medicine

## 2015-10-08 ENCOUNTER — Encounter: Payer: Self-pay | Admitting: Internal Medicine

## 2015-10-08 VITALS — BP 132/92 | HR 68 | Wt 174.0 lb

## 2015-10-08 DIAGNOSIS — Z23 Encounter for immunization: Secondary | ICD-10-CM

## 2015-10-08 DIAGNOSIS — E119 Type 2 diabetes mellitus without complications: Secondary | ICD-10-CM

## 2015-10-08 DIAGNOSIS — I10 Essential (primary) hypertension: Secondary | ICD-10-CM

## 2015-10-08 DIAGNOSIS — C61 Malignant neoplasm of prostate: Secondary | ICD-10-CM | POA: Diagnosis not present

## 2015-10-08 MED ORDER — PIOGLITAZONE HCL 45 MG PO TABS: 45.0000 mg | ORAL_TABLET | Freq: Every day | ORAL | Status: DC

## 2015-10-08 NOTE — Assessment & Plan Note (Signed)
On Amlodipine and Losartan 

## 2015-10-08 NOTE — Assessment & Plan Note (Addendum)
Not at goal Actos - increase to 45 mg/d  Amaryl

## 2015-10-08 NOTE — Progress Notes (Signed)
Pre visit review using our clinic review tool, if applicable. No additional management support is needed unless otherwise documented below in the visit note. 

## 2015-10-08 NOTE — Progress Notes (Signed)
Subjective:  Patient ID: Jacob Chapman, male    DOB: 1945-12-12  Age: 70 y.o. MRN: [id number]  CC: No chief complaint on file.   HPI Jacob Chapman presents for DM, HTN, prostate ca f/u  Outpatient Prescriptions Prior to Visit  Medication Sig Dispense Refill  . acyclovir (ZOVIRAX) 400 MG tablet TAKE 1 TABLET BY MOUTH 3 TIMES DAILY FOR COLD SORES 21 tablet 3  . amLODipine (NORVASC) 5 MG tablet Take 1.5 tablets (7.5 mg total) by mouth daily. 135 tablet 3  . aspirin 81 MG EC tablet Take 81 mg by mouth daily.     . bisacodyl (DULCOLAX) 5 MG EC tablet Take 5 mg by mouth daily as needed for moderate constipation. Dulcolax 5 mg tab take as directed for colonoscopy prep.    . cholecalciferol (VITAMIN D) 1000 UNITS tablet daily.    . Cyanocobalamin (VITAMIN B12 PO) daily.    Marland Kitchen glimepiride (AMARYL) 2 MG tablet Take 2 tablets (4 mg total) by mouth daily before breakfast. 180 tablet 3  . ibuprofen (ADVIL,MOTRIN) 800 MG tablet Take 1 tablet (800 mg total) by mouth 3 (three) times daily as needed. 90 tablet 1  . losartan (COZAAR) 100 MG tablet Take 1 tablet (100 mg total) by mouth daily. 90 tablet 3  . lovastatin (MEVACOR) 20 MG tablet Take 1 tablet (20 mg total) by mouth at bedtime. 90 tablet 3  . metaxalone (SKELAXIN) 800 MG tablet Take 1 tablet (800 mg total) by mouth 3 (three) times daily as needed. 100 tablet 1  . pioglitazone (ACTOS) 30 MG tablet Take 1 tablet (30 mg total) by mouth daily. 90 tablet 3  . vitamin B-12 (CYANOCOBALAMIN) 500 MCG tablet Take 500 mcg by mouth daily.      No facility-administered medications prior to visit.    ROS Review of Systems  Constitutional: Negative for appetite change, fatigue and unexpected weight change.  HENT: Negative for congestion, nosebleeds, sneezing, sore throat and trouble swallowing.   Eyes: Negative for itching and visual disturbance.  Respiratory: Negative for cough.   Cardiovascular: Negative for chest pain, palpitations and leg  swelling.  Gastrointestinal: Negative for nausea, diarrhea, blood in stool and abdominal distention.  Genitourinary: Negative for frequency and hematuria.  Musculoskeletal: Negative for back pain, joint swelling, gait problem and neck pain.  Skin: Negative for rash.  Neurological: Negative for dizziness, tremors, speech difficulty and weakness.  Psychiatric/Behavioral: Negative for sleep disturbance, dysphoric mood and agitation. The patient is not nervous/anxious.     Objective:  BP 132/92 mmHg  Pulse 68  Wt 174 lb (78.926 kg)  SpO2 96%  BP Readings from Last 3 Encounters:  10/08/15 132/92  08/21/15 110/76  07/02/15 136/80    Wt Readings from Last 3 Encounters:  10/08/15 174 lb (78.926 kg)  08/21/15 178 lb (80.74 kg)  08/07/15 178 lb 6.4 oz (80.922 kg)    Physical Exam  Constitutional: He is oriented to person, place, and time. He appears well-developed. No distress.  NAD  HENT:  Mouth/Throat: Oropharynx is clear and moist.  Eyes: Conjunctivae are normal. Pupils are equal, round, and reactive to light.  Neck: Normal range of motion. No JVD present. No thyromegaly present.  Cardiovascular: Normal rate, regular rhythm, normal heart sounds and intact distal pulses.  Exam reveals no gallop and no friction rub.   No murmur heard. Pulmonary/Chest: Effort normal and breath sounds normal. No respiratory distress. He has no wheezes. He has no rales. He exhibits no tenderness.  Abdominal: Soft. Bowel sounds are normal. He exhibits no distension and no mass. There is no tenderness. There is no rebound and no guarding.  Musculoskeletal: Normal range of motion. He exhibits no edema or tenderness.  Lymphadenopathy:    He has no cervical adenopathy.  Neurological: He is alert and oriented to person, place, and time. He has normal reflexes. No cranial nerve deficit. He exhibits normal muscle tone. He displays a negative Romberg sign. Coordination and gait normal.  Skin: Skin is warm and  dry. No rash noted.  Psychiatric: He has a normal mood and affect. His behavior is normal. Judgment and thought content normal.    Lab Results  Component Value Date   WBC 8.3 09/20/2014   HGB 14.4 09/20/2014   HCT 43.4* 09/20/2014   PLT 180 10/30/2011   GLUCOSE 126* 09/30/2015   CHOL 128 09/30/2015   TRIG 83.0 09/30/2015   HDL 41.40 09/30/2015   LDLCALC 70 09/30/2015   ALT 20 06/25/2015   AST 19 06/25/2015   NA 141 09/30/2015   K 4.2 09/30/2015   CL 107 09/30/2015   CREATININE 1.01 09/30/2015   BUN 14 09/30/2015   CO2 26 09/30/2015   TSH 2.115 09/20/2014   PSA 6.73* 07/20/2011   INR CANCELED 01/18/2014   HGBA1C 7.4* 09/30/2015    No results found.  Assessment & Plan:   There are no diagnoses linked to this encounter. I am having Mr. Rumley maintain his aspirin, vitamin B-12, metaxalone, Cyanocobalamin (VITAMIN B12 PO), cholecalciferol, ibuprofen, acyclovir, amLODipine, glimepiride, pioglitazone, lovastatin, losartan, and bisacodyl.  No orders of the defined types were placed in this encounter.     Follow-up: No Follow-up on file.  Walker Kehr, MD

## 2015-10-08 NOTE — Addendum Note (Signed)
Addended by: Cresenciano Lick on: 10/08/2015 11:48 AM   Modules accepted: Orders

## 2015-10-08 NOTE — Assessment & Plan Note (Signed)
Ref - Dr Karsten Ro placed

## 2015-10-09 ENCOUNTER — Telehealth: Payer: Self-pay | Admitting: *Deleted

## 2015-10-09 MED ORDER — PIOGLITAZONE HCL 45 MG PO TABS: 45.0000 mg | ORAL_TABLET | Freq: Every day | ORAL | Status: DC

## 2015-10-09 NOTE — Telephone Encounter (Signed)
Receive call pt states he needed the medication md rx yesterday sent to [facility], but MD sent it to [facility]. Inform pt will send Actos to [facility]. Also called [facility] cx rx...Johny Chess

## 2015-12-02 MED FILL — ACYCLOVIR 400 MG TABLET: 400 | 7 days supply | Qty: 21 | Fill #0

## 2016-01-30 ENCOUNTER — Ambulatory Visit (INDEPENDENT_AMBULATORY_CARE_PROVIDER_SITE_OTHER): Payer: Commercial Managed Care - HMO | Admitting: Family Medicine

## 2016-01-30 ENCOUNTER — Ambulatory Visit (HOSPITAL_COMMUNITY)
Admission: RE | Admit: 2016-01-30 | Discharge: 2016-01-30 | Disposition: A | Discharge status: Home / Self Care | Payer: Commercial Managed Care - HMO | Source: Ambulatory Visit | Attending: Family Medicine | Admitting: Family Medicine

## 2016-01-30 ENCOUNTER — Telehealth: Payer: Self-pay | Admitting: Family Medicine

## 2016-01-30 VITALS — BP 126/84 | HR 63 | Temp 97.4°F | Resp 17 | Ht 66.0 in | Wt 180.0 lb

## 2016-01-30 DIAGNOSIS — R1031 Right lower quadrant pain: Secondary | ICD-10-CM

## 2016-01-30 DIAGNOSIS — R1032 Left lower quadrant pain: Secondary | ICD-10-CM

## 2016-01-30 DIAGNOSIS — Z9889 Other specified postprocedural states: Secondary | ICD-10-CM | POA: Diagnosis not present

## 2016-01-30 DIAGNOSIS — Z125 Encounter for screening for malignant neoplasm of prostate: Secondary | ICD-10-CM | POA: Diagnosis not present

## 2016-01-30 DIAGNOSIS — R1030 Lower abdominal pain, unspecified: Secondary | ICD-10-CM | POA: Diagnosis not present

## 2016-01-30 LAB — PSA: PSA: 0.3 ng/mL (ref <=4.0)

## 2016-01-30 MED ORDER — TRAMADOL HCL 50 MG PO TABS: 50.0000 mg | ORAL_TABLET | Freq: Three times a day (TID) | ORAL | 0 refills | Status: DC | PRN

## 2016-01-30 MED FILL — traMADol HCL 50 MG TABS: 50 | 10 days supply | Qty: 30 | Fill #0

## 2016-01-30 NOTE — Progress Notes (Deleted)
Jacob Chapman is a 70 y.o. male who presents to [facility] today for ***  1.  ***  ROS as above.  Pertinently, no chest pain, palpitations, SOB, Fever, Chills, Abd pain, N/V/D.   PMH reviewed. Patient is a nonsmoker.   Past Medical History:  Diagnosis Date  . Bulging lumbar disc    no current prob  . Chronic lower back pain    Hx - no current prob  . Diabetes mellitus, type 2 ([place])    type II  . History of gout stable -- last bout 3 yrs ago   no current prob  . HTN (hypertension)   . Hyperlipidemia   . Prostate cancer ([place]) 09/01/11   dx Adenocarcinoma   Past Surgical History:  Procedure Laterality Date  . COLONOSCOPY  2005, 2017  . CYSTOSCOPY  11/06/2011   Procedure: CYSTOSCOPY FLEXIBLE;  Surgeon: Claybon Jabs, MD;  Location: [facility];  Service: Urology;  Laterality: N/A;  no seeds found in bladder  . PROSTATE BIOPSY  09/01/2011   DR OTTELIN'S OFFICE   gleason 3+3=6,volume=21cc,PSA=6.73  . RADIOACTIVE SEED IMPLANT  11/06/2011   Procedure: RADIOACTIVE SEED IMPLANT;  Surgeon: Claybon Jabs, MD;  Location: [facility];  Service: Urology;  Laterality: Bilateral;  85 seeds implanted  . VASECTOMY      Medications reviewed. Current Outpatient Prescriptions  Medication Sig Dispense Refill  . acyclovir (ZOVIRAX) 400 MG tablet TAKE 1 TABLET BY MOUTH 3 TIMES DAILY FOR COLD SORES 21 tablet 3  . amLODipine (NORVASC) 5 MG tablet Take 1.5 tablets (7.5 mg total) by mouth daily. 135 tablet 3  . aspirin 81 MG EC tablet Take 81 mg by mouth daily.     . bisacodyl (DULCOLAX) 5 MG EC tablet Take 5 mg by mouth daily as needed for moderate constipation. Dulcolax 5 mg tab take as directed for colonoscopy prep.    . cholecalciferol (VITAMIN D) 1000 UNITS tablet daily.    . Cyanocobalamin (VITAMIN B12 PO) daily.    Marland Kitchen glimepiride (AMARYL) 2 MG tablet Take 2 tablets (4 mg total) by mouth daily before breakfast. 180 tablet 3  . ibuprofen  (ADVIL,MOTRIN) 800 MG tablet Take 1 tablet (800 mg total) by mouth 3 (three) times daily as needed. 90 tablet 1  . losartan (COZAAR) 100 MG tablet Take 1 tablet (100 mg total) by mouth daily. 90 tablet 3  . lovastatin (MEVACOR) 20 MG tablet Take 1 tablet (20 mg total) by mouth at bedtime. 90 tablet 3  . metaxalone (SKELAXIN) 800 MG tablet Take 1 tablet (800 mg total) by mouth 3 (three) times daily as needed. 100 tablet 1  . pioglitazone (ACTOS) 45 MG tablet Take 1 tablet (45 mg total) by mouth daily. 90 tablet 3  . vitamin B-12 (CYANOCOBALAMIN) 500 MCG tablet Take 500 mcg by mouth daily.      No current facility-administered medications for this visit.      Physical Exam:  BP 126/84 (BP Location: Left Arm, Patient Position: Sitting, Cuff Size: Normal)   Pulse 63   Temp 97.4 F (36.3 C) (Oral)   Resp 17   Ht 5\' 6"  (1.676 m)   Wt 180 lb (81.6 kg)   SpO2 99%   BMI 29.05 kg/m  Gen:  Alert, cooperative patient who appears stated age in no acute distress.  Vital signs reviewed. HEENT: EOMI,  MMM Pulm:  Clear to auscultation bilaterally with good air movement.  No  wheezes or rales noted.   Cardiac:  Regular rate and rhythm without murmur auscultated.  Good S1/S2. Abd:  Soft/nondistended/nontender.  Good bowel sounds throughout all four quadrants.  No masses noted.  Exts: Non edematous BL  LE, warm and well perfused.   Assessment and Plan:  1.  ***

## 2016-01-30 NOTE — Progress Notes (Signed)
Patient ID: Jacob Chapman, male    DOB: 1946-01-01, 70 y.o.   MRN: [id number]  PCP: Walker Kehr, MD  Chief Complaint  Patient presents with  . Groin Pain    Pt thinks he pulled muscle picking up box last week    Subjective:   HPI 70 year old male presents for evaluation of left groin pain times 1 week.  He reports picking up heavy boxes last week and subsequently waking up with an aching pain in his left groin. Patient has a history of prostate cancer in 2013. Reports never experiencing a hernia. He reports a similar pain several years ago following his prostate biopsy that resolved without treatment. This pain he describes as a persistent throbbing pain. He denies blood or pain with urination. Pain is relieved with lying down. He has taken ibuprofen with minimal relief of symptoms.  Social History   Social History  . Marital status: Married    Spouse name: N/A  . Number of children: N/A  . Years of education: N/A   Occupational History  . (WORKS DAYS) [place]    assembler for [employer] History Main Topics  . Smoking status: Never Smoker  . Smokeless tobacco: Never Used  . Alcohol use No  . Drug use: No  . Sexual activity: Yes   Other Topics Concern  . Not on file   Social History Narrative   Regular exercise - YES         Family History  Problem Relation Age of Onset  . Adopted: Yes  . Hypertension Other 47    maternal 1st cousin,leukemia deceased  . Stroke Mother     32  . Cancer Father 84    lung ca  . Heart disease Sister 61    rhematic fever  . Colon cancer Neg Hx   . Colon polyps Neg Hx   . Esophageal cancer Neg Hx   . Rectal cancer Neg Hx   . Stomach cancer Neg Hx    Review of Systems  HPI  Patient Active Problem List   Diagnosis Date Noted  . Plantar fasciitis of right foot 02/26/2014  . Abscess or cellulitis, neck 11/09/2011  . Mass of right side of neck 11/09/2011  . Prostate cancer ([place]) 09/01/2011  . Lipoma of neck  09/12/2010  . Pain in limb 09/03/2009  . CHEST PAIN, UNSPECIFIED 05/02/2008  . OSTEOARTHRITIS 09/09/2007  . LOW BACK PAIN 09/09/2007  . Dyslipidemia 05/02/2007  . Gout 05/02/2007  . Essential hypertension 05/02/2007  . PARESTHESIA 05/02/2007  . DM2 (diabetes mellitus, type 2) ([place]) 04/25/2007     Prior to Admission medications   Medication Sig Start Date End Date Taking? Authorizing Provider  acyclovir (ZOVIRAX) 400 MG tablet TAKE 1 TABLET BY MOUTH 3 TIMES DAILY FOR COLD SORES 04/01/15  Yes Evie Lacks Plotnikov, MD  amLODipine (NORVASC) 5 MG tablet Take 1.5 tablets (7.5 mg total) by mouth daily. 07/02/15  Yes Evie Lacks Plotnikov, MD  aspirin 81 MG EC tablet Take 81 mg by mouth daily.    Yes Historical Provider, MD  bisacodyl (DULCOLAX) 5 MG EC tablet Take 5 mg by mouth daily as needed for moderate constipation. Dulcolax 5 mg tab take as directed for colonoscopy prep.   Yes Historical Provider, MD  cholecalciferol (VITAMIN D) 1000 UNITS tablet daily. 07/20/14  Yes Historical Provider, MD  Cyanocobalamin (VITAMIN B12 PO) daily. 07/20/14  Yes Historical Provider, MD  glimepiride (AMARYL) 2 MG tablet Take 2  tablets (4 mg total) by mouth daily before breakfast. 07/02/15  Yes Cassandria Anger, MD  ibuprofen (ADVIL,MOTRIN) 800 MG tablet Take 1 tablet (800 mg total) by mouth 3 (three) times daily as needed. 03/05/15  Yes Evie Lacks Plotnikov, MD  losartan (COZAAR) 100 MG tablet Take 1 tablet (100 mg total) by mouth daily. 07/02/15  Yes Evie Lacks Plotnikov, MD  lovastatin (MEVACOR) 20 MG tablet Take 1 tablet (20 mg total) by mouth at bedtime. 07/02/15  Yes Evie Lacks Plotnikov, MD  metaxalone (SKELAXIN) 800 MG tablet Take 1 tablet (800 mg total) by mouth 3 (three) times daily as needed. 06/26/14  Yes Evie Lacks Plotnikov, MD  pioglitazone (ACTOS) 45 MG tablet Take 1 tablet (45 mg total) by mouth daily. 10/09/15  Yes Evie Lacks Plotnikov, MD  vitamin B-12 (CYANOCOBALAMIN) 500 MCG tablet Take 500 mcg by mouth  daily.    Yes Historical Provider, MD     Allergies  Allergen Reactions  . Metformin Nausea Only       Objective:  Physical Exam  Constitutional: He is oriented to person, place, and time. He appears well-developed and well-nourished.  HENT:  Head: Normocephalic and atraumatic.  Right Ear: External ear normal.  Left Ear: External ear normal.  Abdominal: Soft. Bowel sounds are normal.  Pain is negative for bony tenderness. Pain is not reproducible with palpation.  Genitourinary: Penis normal. No penile tenderness.  Genitourinary Comments: Negative for palpable mass in pelvic, groin, or scrotal region   Musculoskeletal: Normal range of motion.  Neurological: He is alert and oriented to person, place, and time.  Skin: Skin is warm and dry.  Psychiatric: He has a normal mood and affect. His behavior is normal. Judgment and thought content normal.    Vitals:   01/30/16 0805  BP: 126/84  Pulse: 63  Resp: 17  Temp: 97.4 F (36.3 C)     Assessment & Plan:  1. Pain in the groin, left - PSA - US Pelvis Limited  2. Hx of prostate biopsy  -PSA - US Pelvis Limited  70 year old, male presents with left sided groin pain times 1 week. He reports experiencing a similar pain previously and associates this current episode of left groin pain with lifting heavy boxes. Checking a PSA to confirm no acute elevation as patient has a prior history of prostate cancer. Secondly, unable to palpate a mass or reproduce groin pain on exam.  Obtaining an ultrasound of pelvis to rule out hernia that may be contributing to pain. If ultrasound is negative for anything acute, will treat as a muscle strain and manage pain.  Plan: Tramadol 50 mg every 8 hours as needed for pain. Remain out of work until 02/03/16. Avoid  lifting heavy objects until pain resolves.  Carroll Sage. Kenton Kingfisher, MSN, FNP-C [facility]

## 2016-01-30 NOTE — Telephone Encounter (Signed)
Spoke with patient via phone and advised of normal ultrasound of pelvis.  Continue Tramadol as needed for pain. Avoid lifting heavy objects until pain completely subsides. Return for care if pain doesn't resolve after two weeks.  Carroll Sage. Kenton Kingfisher, MSN, FNP-C [facility]

## 2016-01-30 NOTE — Patient Instructions (Addendum)
Pelvic Ultrasound    Your ultrasound is scheduled for 330pm at [facility].  Please be there by 315pm.   You must come with a full bladder, so be sure to drink 32 ounces of water starting at 230pm.   Address: [address], [place], [place] [address] [phone]      IF you received an x-ray today, you will receive an invoice from [facility]. Please contact [facility] at [phone] with questions or concerns regarding your invoice.   IF you received labwork today, you will receive an invoice from [employer]. Please contact [facility] at [phone] with questions or concerns regarding your invoice.   Our billing staff will not be able to assist you with questions regarding bills from these companies.  You will be contacted with the lab results as soon as they are available. The fastest way to get your results is to activate your My Chart account. Instructions are located on the last page of this paperwork. If you have not heard from [place] regarding the results in 2 weeks, please contact this office.     Groin Strain A groin strain (also called a groin pull) is an injury to the muscles or tendon on the upper inner part of the thigh. These muscles are called the adductor muscles or groin muscles. They are responsible for moving the leg across the body. A muscle strain occurs when a muscle is overstretched and some muscle fibers are torn. A groin strain can range from mild to severe depending on how many muscle fibers are affected and whether the muscle fibers are partially or completely torn.  Groin strains usually occur during exercise or participation in sports. The injury often happens when a sudden, violent force is placed on a muscle, stretching the muscle too far. A strain is more likely to occur when your muscles are not warmed up or if you are not properly conditioned. Depending on the severity of the groin strain, recovery time may vary from  a few weeks to several weeks. Severe injuries often require 4-6 weeks for recovery. In these cases, complete healing can take 4-5 months.  CAUSES   Stretching the groin muscles too far or too suddenly, often during side-to-side motion with an abrupt change in direction.  Putting repeated stress on the groin muscles over a long period of time.  Performing vigorous activity without properly stretching the groin muscles beforehand. SYMPTOMS   Pain and tenderness in the groin area. This begins as sharp pain and persists as a dull ache.  Popping or snapping feeling when the injury occurs (for severe strains).  Swelling or bruising.  Muscle spasms.  Weakness in the leg.  Stiffness in the groin area with decreased ability to move the affected muscles. DIAGNOSIS  Your caregiver will perform a physical exam to diagnose a groin strain. You will be asked about your symptoms and how the injury occurred. X-rays are sometimes needed to rule out a broken bone or cartilage problems. Your caregiver may order a CT scan or MRI if a complete muscle tear is suspected. TREATMENT  A groin strain will often heal on its own. Your caregiver may prescribe medicines to help manage pain and swelling (anti-inflammatory medicine). You may be told to use crutches for the first few days to minimize your pain. HOME CARE INSTRUCTIONS   Rest. Do not use the strained muscle if it causes pain.  Put ice on the injured area.  Put ice in a plastic bag.  Place a towel between your skin and the bag.  Leave the ice on for 15-20 minutes, every 2-3 hours. Do this for the first 2 days after the injury.  Only take over-the-counter or prescription medicines as directed by your caregiver.  Wrap the injured area with an elastic bandage as directed by your caregiver.  Keep the injured leg raised (elevated).  Walk, stretch, and perform range-of-motion exercises to improve blood flow to the injured area. Only perform these  activities if you can do so without any pain. To prevent muscle strains:  Warm up before exercise.  Develop proper conditioning and strength in the groin muscles. SEEK IMMEDIATE MEDICAL CARE IF:   You have increased pain or swelling in the affected area.   Your symptoms are not improving or are getting worse. MAKE SURE YOU:   Understand these instructions.  Will watch your condition.  Will get help right away if you are not doing well or get worse.   This information is not intended to replace advice given to you by your health care provider. Make sure you discuss any questions you have with your health care provider.   Document Released: 12/03/2003 Document Revised: 03/23/2012 Document Reviewed: 12/09/2011 Elsevier Interactive Patient Education [employer].

## 2016-02-04 ENCOUNTER — Ambulatory Visit: Payer: Commercial Managed Care - HMO | Admitting: Internal Medicine

## 2016-02-19 MED FILL — ACYCLOVIR 400 MG TABLET: 400 | 7 days supply | Qty: 21 | Fill #1

## 2016-02-21 ENCOUNTER — Other Ambulatory Visit (INDEPENDENT_AMBULATORY_CARE_PROVIDER_SITE_OTHER): Payer: Commercial Managed Care - HMO

## 2016-02-21 DIAGNOSIS — E119 Type 2 diabetes mellitus without complications: Secondary | ICD-10-CM

## 2016-02-21 DIAGNOSIS — I10 Essential (primary) hypertension: Secondary | ICD-10-CM

## 2016-02-21 DIAGNOSIS — E785 Hyperlipidemia, unspecified: Secondary | ICD-10-CM

## 2016-02-21 DIAGNOSIS — R209 Unspecified disturbances of skin sensation: Secondary | ICD-10-CM

## 2016-02-21 LAB — BASIC METABOLIC PANEL
BUN: 16 mg/dL (ref 6–23)
CALCIUM: 9.6 mg/dL (ref 8.4–10.5)
CO2: 27 mEq/L (ref 19–32)
Chloride: 108 mEq/L (ref 96–112)
Creatinine, Ser: 0.97 mg/dL (ref 0.40–1.50)
GFR: 81.79 mL/min (ref >60.00)
GLUCOSE: 130 mg/dL — AB (ref 70–99)
Potassium: 4.5 mEq/L (ref 3.5–5.1)
Sodium: 143 mEq/L (ref 135–145)

## 2016-02-21 LAB — LIPID PANEL
CHOLESTEROL: 125 mg/dL (ref 0–200)
HDL: 52.2 mg/dL (ref >39.00)
LDL CALC: 59 mg/dL (ref 0–99)
NonHDL: 73.04
TRIGLYCERIDES: 69 mg/dL (ref 0.0–149.0)
Total CHOL/HDL Ratio: 2
VLDL: 13.8 mg/dL (ref 0.0–40.0)

## 2016-02-21 LAB — HEPATIC FUNCTION PANEL
ALBUMIN: 4.3 g/dL (ref 3.5–5.2)
ALT: 19 U/L (ref 0–53)
AST: 21 U/L (ref 0–37)
Alkaline Phosphatase: 65 U/L (ref 39–117)
Bilirubin, Direct: 0.1 mg/dL (ref 0.0–0.3)
TOTAL PROTEIN: 7.1 g/dL (ref 6.0–8.3)
Total Bilirubin: 0.6 mg/dL (ref 0.2–1.2)

## 2016-02-21 LAB — HEMOGLOBIN A1C: Hgb A1c MFr Bld: 7.2 % — ABNORMAL HIGH (ref 4.6–6.5)

## 2016-02-26 ENCOUNTER — Encounter: Payer: Self-pay | Admitting: Internal Medicine

## 2016-02-26 ENCOUNTER — Ambulatory Visit (INDEPENDENT_AMBULATORY_CARE_PROVIDER_SITE_OTHER): Payer: Commercial Managed Care - HMO | Admitting: Internal Medicine

## 2016-02-26 DIAGNOSIS — I10 Essential (primary) hypertension: Secondary | ICD-10-CM

## 2016-02-26 DIAGNOSIS — E785 Hyperlipidemia, unspecified: Secondary | ICD-10-CM

## 2016-02-26 DIAGNOSIS — F4320 Adjustment disorder, unspecified: Secondary | ICD-10-CM

## 2016-02-26 DIAGNOSIS — C61 Malignant neoplasm of prostate: Secondary | ICD-10-CM

## 2016-02-26 DIAGNOSIS — E119 Type 2 diabetes mellitus without complications: Secondary | ICD-10-CM

## 2016-02-26 DIAGNOSIS — F4321 Adjustment disorder with depressed mood: Secondary | ICD-10-CM

## 2016-02-26 DIAGNOSIS — M1000 Idiopathic gout, unspecified site: Secondary | ICD-10-CM

## 2016-02-26 MED ORDER — LOVASTATIN 20 MG PO TABS: 20.0000 mg | ORAL_TABLET | Freq: Every day | ORAL | 3 refills | Status: DC

## 2016-02-26 MED ORDER — LOSARTAN POTASSIUM 100 MG PO TABS: 100.0000 mg | ORAL_TABLET | Freq: Every day | ORAL | 3 refills | Status: DC

## 2016-02-26 MED ORDER — IBUPROFEN 800 MG PO TABS
800.0000 mg | ORAL_TABLET | Freq: Three times a day (TID) | ORAL | 1 refills | Status: DC | PRN
Start: 2016-02-26 — End: 2017-02-17

## 2016-02-26 MED ORDER — AMLODIPINE BESYLATE 5 MG PO TABS: 7.5000 mg | ORAL_TABLET | Freq: Every day | ORAL | 3 refills | Status: DC

## 2016-02-26 NOTE — Progress Notes (Signed)
Subjective:  Patient ID: Jacob Chapman, male    DOB: [birth year]  Age: 70 y.o. MRN: [id number]  CC: No chief complaint on file.   HPI Jacob Chapman presents for DM, HTN, dyslipidemia f/u. He lost his wife of 43 years last week  Outpatient Medications Prior to Visit  Medication Sig Dispense Refill  . acyclovir (ZOVIRAX) 400 MG tablet TAKE 1 TABLET BY MOUTH 3 TIMES DAILY FOR COLD SORES 21 tablet 3  . amLODipine (NORVASC) 5 MG tablet Take 1.5 tablets (7.5 mg total) by mouth daily. 135 tablet 3  . aspirin 81 MG EC tablet Take 81 mg by mouth daily.     . bisacodyl (DULCOLAX) 5 MG EC tablet Take 5 mg by mouth daily as needed for moderate constipation. Dulcolax 5 mg tab take as directed for colonoscopy prep.    . cholecalciferol (VITAMIN D) 1000 UNITS tablet daily.    . Cyanocobalamin (VITAMIN B12 PO) daily.    Marland Kitchen glimepiride (AMARYL) 2 MG tablet Take 2 tablets (4 mg total) by mouth daily before breakfast. 180 tablet 3  . ibuprofen (ADVIL,MOTRIN) 800 MG tablet Take 1 tablet (800 mg total) by mouth 3 (three) times daily as needed. 90 tablet 1  . losartan (COZAAR) 100 MG tablet Take 1 tablet (100 mg total) by mouth daily. 90 tablet 3  . lovastatin (MEVACOR) 20 MG tablet Take 1 tablet (20 mg total) by mouth at bedtime. 90 tablet 3  . metaxalone (SKELAXIN) 800 MG tablet Take 1 tablet (800 mg total) by mouth 3 (three) times daily as needed. 100 tablet 1  . pioglitazone (ACTOS) 45 MG tablet Take 1 tablet (45 mg total) by mouth daily. 90 tablet 3  . traMADol (ULTRAM) 50 MG tablet Take 1 tablet (50 mg total) by mouth every 8 (eight) hours as needed. 30 tablet 0  . vitamin B-12 (CYANOCOBALAMIN) 500 MCG tablet Take 500 mcg by mouth daily.      No facility-administered medications prior to visit.     ROS Review of Systems  Constitutional: Negative for appetite change, fatigue and unexpected weight change.  HENT: Negative for congestion, nosebleeds, sneezing, sore throat and trouble swallowing.     Eyes: Negative for itching and visual disturbance.  Respiratory: Negative for cough.   Cardiovascular: Negative for chest pain, palpitations and leg swelling.  Gastrointestinal: Negative for abdominal distention, blood in stool, diarrhea and nausea.  Genitourinary: Negative for frequency and hematuria.  Musculoskeletal: Negative for back pain, gait problem, joint swelling and neck pain.  Skin: Negative for rash.  Neurological: Negative for dizziness, tremors, speech difficulty and weakness.  Psychiatric/Behavioral: Positive for dysphoric mood. Negative for agitation, sleep disturbance and suicidal ideas. The patient is not nervous/anxious.     Objective:  BP 118/86   Pulse 77   Wt 171 lb (77.6 kg)   SpO2 97%   BMI 27.60 kg/m   BP Readings from Last 3 Encounters:  02/26/16 118/86  01/30/16 126/84  10/08/15 (!) 132/92    Wt Readings from Last 3 Encounters:  02/26/16 171 lb (77.6 kg)  01/30/16 180 lb (81.6 kg)  10/08/15 174 lb (78.9 kg)    Physical Exam  Constitutional: He is oriented to person, place, and time. He appears well-developed. No distress.  NAD  HENT:  Mouth/Throat: Oropharynx is clear and moist.  Eyes: Conjunctivae are normal. Pupils are equal, round, and reactive to light.  Neck: Normal range of motion. No JVD present. No thyromegaly present.  Cardiovascular: Normal rate, regular  rhythm, normal heart sounds and intact distal pulses.  Exam reveals no gallop and no friction rub.   No murmur heard. Pulmonary/Chest: Effort normal and breath sounds normal. No respiratory distress. He has no wheezes. He has no rales. He exhibits no tenderness.  Abdominal: Soft. Bowel sounds are normal. He exhibits no distension and no mass. There is no tenderness. There is no rebound and no guarding.  Musculoskeletal: Normal range of motion. He exhibits no edema or tenderness.  Lymphadenopathy:    He has no cervical adenopathy.  Neurological: He is alert and oriented to person,  place, and time. He has normal reflexes. No cranial nerve deficit. He exhibits normal muscle tone. He displays a negative Romberg sign. Coordination and gait normal.  Skin: Skin is warm and dry. No rash noted.  Psychiatric: His behavior is normal. Judgment and thought content normal.  sad  Lab Results  Component Value Date   WBC 8.3 09/20/2014   HGB 14.4 09/20/2014   HCT 43.4 (A) 09/20/2014   PLT 180 10/30/2011   GLUCOSE 130 (H) 02/21/2016   CHOL 125 02/21/2016   TRIG 69.0 02/21/2016   HDL 52.20 02/21/2016   LDLCALC 59 02/21/2016   ALT 19 02/21/2016   AST 21 02/21/2016   NA 143 02/21/2016   K 4.5 02/21/2016   CL 108 02/21/2016   CREATININE 0.97 02/21/2016   BUN 16 02/21/2016   CO2 27 02/21/2016   TSH 2.115 09/20/2014   PSA 0.3 01/30/2016   INR CANCELED 01/18/2014   HGBA1C 7.2 (H) 02/21/2016    US Pelvis Limited  Result Date: 01/30/2016 CLINICAL DATA:  Left groin pain for 3 days after heavy lifting, assessment for hernia. EXAM: LIMITED ULTRASOUND OF PELVIS TECHNIQUE: Limited transabdominal ultrasound examination of the pelvis was performed. COMPARISON:  11/08/2009 CT pelvis FINDINGS: Assessment of the left groin/ inguinal region demonstrates no hernia. Comparison images of the right-side appear symmetric. IMPRESSION: 1. No groin hernia is demonstrated on the left side. Electronically Signed   By: Van Clines M.D.   On: 01/30/2016 16:21    Assessment & Plan:   There are no diagnoses linked to this encounter. I am having Mr. Rancourt maintain his aspirin, vitamin B-12, metaxalone, Cyanocobalamin (VITAMIN B12 PO), cholecalciferol, ibuprofen, acyclovir, amLODipine, glimepiride, lovastatin, losartan, bisacodyl, pioglitazone, and traMADol.  No orders of the defined types were placed in this encounter.    Follow-up: No Follow-up on file.  Walker Kehr, MD

## 2016-02-26 NOTE — Assessment & Plan Note (Signed)
Jacob Chapman lost his wife of 84 years last week 11/17 Discussed

## 2016-02-26 NOTE — Assessment & Plan Note (Signed)
Amlodipine, Losartan 

## 2016-02-26 NOTE — Assessment & Plan Note (Signed)
Lovastatin 

## 2016-02-26 NOTE — Assessment & Plan Note (Signed)
Actos, Amaryl 

## 2016-02-26 NOTE — Assessment & Plan Note (Signed)
Doing fair 

## 2016-02-26 NOTE — Progress Notes (Signed)
Pre visit review using our clinic review tool, if applicable. No additional management support is needed unless otherwise documented below in the visit note. 

## 2016-02-26 NOTE — Assessment & Plan Note (Signed)
Losartan 

## 2016-03-17 DIAGNOSIS — Z8546 Personal history of malignant neoplasm of prostate: Secondary | ICD-10-CM | POA: Diagnosis not present

## 2016-03-24 DIAGNOSIS — Z8546 Personal history of malignant neoplasm of prostate: Secondary | ICD-10-CM | POA: Diagnosis not present

## 2016-05-19 ENCOUNTER — Other Ambulatory Visit (INDEPENDENT_AMBULATORY_CARE_PROVIDER_SITE_OTHER): Payer: Commercial Managed Care - HMO

## 2016-05-19 DIAGNOSIS — E119 Type 2 diabetes mellitus without complications: Secondary | ICD-10-CM

## 2016-05-19 DIAGNOSIS — E785 Hyperlipidemia, unspecified: Secondary | ICD-10-CM | POA: Diagnosis not present

## 2016-05-19 DIAGNOSIS — I10 Essential (primary) hypertension: Secondary | ICD-10-CM | POA: Diagnosis not present

## 2016-05-19 DIAGNOSIS — R209 Unspecified disturbances of skin sensation: Secondary | ICD-10-CM | POA: Diagnosis not present

## 2016-05-19 LAB — BASIC METABOLIC PANEL
BUN: 14 mg/dL (ref 6–23)
CALCIUM: 9.1 mg/dL (ref 8.4–10.5)
CO2: 27 mEq/L (ref 19–32)
CREATININE: 1.08 mg/dL (ref 0.40–1.50)
Chloride: 108 mEq/L (ref 96–112)
GFR: 72.2 mL/min (ref >60.00)
GLUCOSE: 116 mg/dL — AB (ref 70–99)
Potassium: 4.4 mEq/L (ref 3.5–5.1)
SODIUM: 141 meq/L (ref 135–145)

## 2016-05-19 LAB — HEPATIC FUNCTION PANEL
ALBUMIN: 4.1 g/dL (ref 3.5–5.2)
ALT: 21 U/L (ref 0–53)
AST: 22 U/L (ref 0–37)
Alkaline Phosphatase: 73 U/L (ref 39–117)
BILIRUBIN TOTAL: 0.4 mg/dL (ref 0.2–1.2)
Bilirubin, Direct: 0.1 mg/dL (ref 0.0–0.3)
TOTAL PROTEIN: 6.9 g/dL (ref 6.0–8.3)

## 2016-05-19 LAB — LIPID PANEL
CHOLESTEROL: 116 mg/dL (ref 0–200)
HDL: 46.4 mg/dL (ref >39.00)
LDL CALC: 56 mg/dL (ref 0–99)
NONHDL: 69.31
Total CHOL/HDL Ratio: 2
Triglycerides: 65 mg/dL (ref 0.0–149.0)
VLDL: 13 mg/dL (ref 0.0–40.0)

## 2016-05-19 LAB — HEMOGLOBIN A1C: Hgb A1c MFr Bld: 7.1 % — ABNORMAL HIGH (ref 4.6–6.5)

## 2016-05-26 ENCOUNTER — Encounter: Payer: Self-pay | Admitting: Internal Medicine

## 2016-05-26 ENCOUNTER — Ambulatory Visit (INDEPENDENT_AMBULATORY_CARE_PROVIDER_SITE_OTHER): Payer: Medicare HMO | Admitting: Internal Medicine

## 2016-05-26 DIAGNOSIS — I10 Essential (primary) hypertension: Secondary | ICD-10-CM | POA: Diagnosis not present

## 2016-05-26 DIAGNOSIS — E785 Hyperlipidemia, unspecified: Secondary | ICD-10-CM

## 2016-05-26 DIAGNOSIS — E119 Type 2 diabetes mellitus without complications: Secondary | ICD-10-CM

## 2016-05-26 MED ORDER — PIOGLITAZONE HCL 45 MG PO TABS: 45.0000 mg | ORAL_TABLET | Freq: Every day | ORAL | 3 refills | Status: DC

## 2016-05-26 MED ORDER — LOSARTAN POTASSIUM 100 MG PO TABS: 100.0000 mg | ORAL_TABLET | Freq: Every day | ORAL | 3 refills | Status: DC

## 2016-05-26 MED ORDER — ACYCLOVIR 400 MG PO TABS: ORAL_TABLET | ORAL | 3 refills | Status: DC

## 2016-05-26 MED ORDER — GLIMEPIRIDE 2 MG PO TABS: 4.0000 mg | ORAL_TABLET | Freq: Every day | ORAL | 3 refills | Status: DC

## 2016-05-26 NOTE — Assessment & Plan Note (Signed)
Amaryl, Actos

## 2016-05-26 NOTE — Progress Notes (Signed)
Pre visit review using our clinic review tool, if applicable. No additional management support is needed unless otherwise documented below in the visit note. 

## 2016-05-26 NOTE — Assessment & Plan Note (Signed)
Losartan, Amlodipine

## 2016-05-26 NOTE — Assessment & Plan Note (Signed)
Lovastratin

## 2016-05-26 NOTE — Progress Notes (Signed)
Subjective:  Patient ID: Jacob Chapman, male    DOB: [birth year]  Age: 71 y.o. MRN: [id number]  CC: No chief complaint on file.   HPI Jacob Chapman presents for DM2, HTN, dyslipidemia f/u  Outpatient Medications Prior to Visit  Medication Sig Dispense Refill  . acyclovir (ZOVIRAX) 400 MG tablet TAKE 1 TABLET BY MOUTH 3 TIMES DAILY FOR COLD SORES 21 tablet 3  . amLODipine (NORVASC) 5 MG tablet Take 1.5 tablets (7.5 mg total) by mouth daily. 135 tablet 3  . aspirin 81 MG EC tablet Take 81 mg by mouth daily.     . bisacodyl (DULCOLAX) 5 MG EC tablet Take 5 mg by mouth daily as needed for moderate constipation. Dulcolax 5 mg tab take as directed for colonoscopy prep.    . cholecalciferol (VITAMIN D) 1000 UNITS tablet daily.    . Cyanocobalamin (VITAMIN B12 PO) daily.    Marland Kitchen glimepiride (AMARYL) 2 MG tablet Take 2 tablets (4 mg total) by mouth daily before breakfast. 180 tablet 3  . ibuprofen (ADVIL,MOTRIN) 800 MG tablet Take 1 tablet (800 mg total) by mouth 3 (three) times daily as needed. 90 tablet 1  . losartan (COZAAR) 100 MG tablet Take 1 tablet (100 mg total) by mouth daily. 90 tablet 3  . lovastatin (MEVACOR) 20 MG tablet Take 1 tablet (20 mg total) by mouth at bedtime. 90 tablet 3  . metaxalone (SKELAXIN) 800 MG tablet Take 1 tablet (800 mg total) by mouth 3 (three) times daily as needed. 100 tablet 1  . pioglitazone (ACTOS) 45 MG tablet Take 1 tablet (45 mg total) by mouth daily. 90 tablet 3  . traMADol (ULTRAM) 50 MG tablet Take 1 tablet (50 mg total) by mouth every 8 (eight) hours as needed. 30 tablet 0  . vitamin B-12 (CYANOCOBALAMIN) 500 MCG tablet Take 500 mcg by mouth daily.      No facility-administered medications prior to visit.     ROS Review of Systems  Constitutional: Negative for appetite change, fatigue and unexpected weight change.  HENT: Negative for congestion, nosebleeds, sneezing, sore throat and trouble swallowing.   Eyes: Negative for itching and visual  disturbance.  Respiratory: Negative for cough.   Cardiovascular: Negative for chest pain, palpitations and leg swelling.  Gastrointestinal: Negative for abdominal distention, blood in stool, diarrhea and nausea.  Genitourinary: Negative for frequency and hematuria.  Musculoskeletal: Negative for back pain, gait problem, joint swelling and neck pain.  Skin: Negative for rash.  Neurological: Negative for dizziness, tremors, speech difficulty and weakness.  Psychiatric/Behavioral: Negative for agitation, dysphoric mood, sleep disturbance and suicidal ideas. The patient is not nervous/anxious.     Objective:  BP 122/78   Pulse 69   Temp 98.2 F (36.8 C) (Oral)   Resp 20   Wt 167 lb 12 oz (76.1 kg)   SpO2 96%   BMI 27.08 kg/m   BP Readings from Last 3 Encounters:  05/26/16 122/78  02/26/16 118/86  01/30/16 126/84    Wt Readings from Last 3 Encounters:  05/26/16 167 lb 12 oz (76.1 kg)  02/26/16 171 lb (77.6 kg)  01/30/16 180 lb (81.6 kg)    Physical Exam  Constitutional: He is oriented to person, place, and time. He appears well-developed. No distress.  NAD  HENT:  Mouth/Throat: Oropharynx is clear and moist.  Eyes: Conjunctivae are normal. Pupils are equal, round, and reactive to light.  Neck: Normal range of motion. No JVD present. No thyromegaly present.  Cardiovascular:  Normal rate, regular rhythm, normal heart sounds and intact distal pulses.  Exam reveals no gallop and no friction rub.   No murmur heard. Pulmonary/Chest: Effort normal and breath sounds normal. No respiratory distress. He has no wheezes. He has no rales. He exhibits no tenderness.  Abdominal: Soft. Bowel sounds are normal. He exhibits no distension and no mass. There is no tenderness. There is no rebound and no guarding.  Musculoskeletal: Normal range of motion. He exhibits no edema or tenderness.  Lymphadenopathy:    He has no cervical adenopathy.  Neurological: He is alert and oriented to person,  place, and time. He has normal reflexes. No cranial nerve deficit. He exhibits normal muscle tone. He displays a negative Romberg sign. Coordination and gait normal.  Skin: Skin is warm and dry. No rash noted.  Psychiatric: He has a normal mood and affect. His behavior is normal. Judgment and thought content normal.    Lab Results  Component Value Date   WBC 8.3 09/20/2014   HGB 14.4 09/20/2014   HCT 43.4 (A) 09/20/2014   PLT 180 10/30/2011   GLUCOSE 116 (H) 05/19/2016   CHOL 116 05/19/2016   TRIG 65.0 05/19/2016   HDL 46.40 05/19/2016   LDLCALC 56 05/19/2016   ALT 21 05/19/2016   AST 22 05/19/2016   NA 141 05/19/2016   K 4.4 05/19/2016   CL 108 05/19/2016   CREATININE 1.08 05/19/2016   BUN 14 05/19/2016   CO2 27 05/19/2016   TSH 2.115 09/20/2014   PSA 0.3 01/30/2016   INR CANCELED 01/18/2014   HGBA1C 7.1 (H) 05/19/2016    US Pelvis Limited  Result Date: 01/30/2016 CLINICAL DATA:  Left groin pain for 3 days after heavy lifting, assessment for hernia. EXAM: LIMITED ULTRASOUND OF PELVIS TECHNIQUE: Limited transabdominal ultrasound examination of the pelvis was performed. COMPARISON:  11/08/2009 CT pelvis FINDINGS: Assessment of the left groin/ inguinal region demonstrates no hernia. Comparison images of the right-side appear symmetric. IMPRESSION: 1. No groin hernia is demonstrated on the left side. Electronically Signed   By: Van Clines M.D.   On: 01/30/2016 16:21    Assessment & Plan:   There are no diagnoses linked to this encounter. I am having [name] maintain his aspirin, vitamin B-12, metaxalone, Cyanocobalamin (VITAMIN B12 PO), cholecalciferol, acyclovir, glimepiride, bisacodyl, pioglitazone, traMADol, amLODipine, ibuprofen, losartan, and lovastatin.  No orders of the defined types were placed in this encounter.    Follow-up: No Follow-up on file.  Walker Kehr, MD

## 2016-07-25 ENCOUNTER — Ambulatory Visit (INDEPENDENT_AMBULATORY_CARE_PROVIDER_SITE_OTHER): Payer: Medicare HMO | Admitting: Physician Assistant

## 2016-07-25 VITALS — BP 110/68 | HR 63 | Temp 97.6°F | Resp 17 | Ht 66.5 in | Wt 169.0 lb

## 2016-07-25 DIAGNOSIS — R21 Rash and other nonspecific skin eruption: Secondary | ICD-10-CM

## 2016-07-25 MED ORDER — MUPIROCIN 2 % EX OINT: 1.0000 "application " | TOPICAL_OINTMENT | Freq: Two times a day (BID) | CUTANEOUS | 1 refills | Status: DC

## 2016-07-25 MED ORDER — DOXYCYCLINE HYCLATE 100 MG PO CAPS: 100.0000 mg | ORAL_CAPSULE | Freq: Two times a day (BID) | ORAL | 0 refills | Status: AC

## 2016-07-25 NOTE — Patient Instructions (Addendum)
Wash with hypoallergenic facial soap and water.  Then place mupirocin ointment.   Take medication as prescribed. If this worsens within 1 week, fever, pus drainage--please return.     IF you received an x-ray today, you will receive an invoice from [facility]. Please contact [facility] at [phone] with questions or concerns regarding your invoice.   IF you received labwork today, you will receive an invoice from [place]. Please contact [facility] at [phone] with questions or concerns regarding your invoice.   Our billing staff will not be able to assist you with questions regarding bills from these companies.  You will be contacted with the lab results as soon as they are available. The fastest way to get your results is to activate your My Chart account. Instructions are located on the last page of this paperwork. If you have not heard from [place] regarding the results in 2 weeks, please contact this office.

## 2016-07-26 NOTE — Progress Notes (Signed)
PRIMARY CARE AT [facility] [address] [place] [address] [phone]  Date:  07/25/2016   Name:  Jacob Chapman   DOB:  21-Jul-1945   MRN:  [id number]  PCP:  Walker Kehr, MD    History of Present Illness:  Jacob Chapman is a 71 y.o. male patient who presents to PCP with  Chief Complaint  Patient presents with  . Rash    on face     Patient states that he started to develop a rash along his chin that started about 1 week ago, and worsened.  Started as one lesion under the chin but then spread to several.  Starts as a hardened swelling under the skin.  Non-pruritic or painful.  No fever.  No drainage from the lesions.  Shaves daily with razor.  Changes about weekly. No other lesions along body, or extremities.   Patient Active Problem List   Diagnosis Date Noted  . Grief 02/26/2016  . Plantar fasciitis of right foot 02/26/2014  . Abscess or cellulitis, neck 11/09/2011  . Mass of right side of neck 11/09/2011  . Prostate cancer ([place]) 09/01/2011  . Lipoma of neck 09/12/2010  . Pain in limb 09/03/2009  . CHEST PAIN, UNSPECIFIED 05/02/2008  . OSTEOARTHRITIS 09/09/2007  . LOW BACK PAIN 09/09/2007  . Dyslipidemia 05/02/2007  . Gout 05/02/2007  . Essential hypertension 05/02/2007  . PARESTHESIA 05/02/2007  . DM2 (diabetes mellitus, type 2) ([place]) 04/25/2007    Past Medical History:  Diagnosis Date  . Bulging lumbar disc    no current prob  . Chronic lower back pain    Hx - no current prob  . Diabetes mellitus, type 2 ([place])    type II  . History of gout stable -- last bout 3 yrs ago   no current prob  . HTN (hypertension)   . Hyperlipidemia   . Prostate cancer ([place]) 09/01/11   dx Adenocarcinoma    Past Surgical History:  Procedure Laterality Date  . COLONOSCOPY  2005, 2017  . CYSTOSCOPY  11/06/2011   Procedure: CYSTOSCOPY FLEXIBLE;  Surgeon: Claybon Jabs, MD;  Location[facility];  Service: Urology;  Laterality: N/A;  no seeds found in bladder   . PROSTATE BIOPSY  09/01/2011   DR OTTELIN'S OFFICE   gleason 3+3=6,volume=21cc,PSA=6.73  . RADIOACTIVE SEED IMPLANT  11/06/2011   Procedure: RADIOACTIVE SEED IMPLANT;  Surgeon: Claybon Jabs, MD;  Location: [facility];  Service: Urology;  Laterality: Bilateral;  85 seeds implanted  . VASECTOMY      Social History  Substance Use Topics  . Smoking status: Never Smoker  . Smokeless tobacco: Never Used  . Alcohol use No    Family History  Problem Relation Age of Onset  . Adopted: Yes  . Hypertension Other 64    maternal 1st cousin,leukemia deceased  . Stroke Mother     7  . Cancer Father 24    lung ca  . Heart disease Sister 54    rhematic fever  . Colon cancer Neg Hx   . Colon polyps Neg Hx   . Esophageal cancer Neg Hx   . Rectal cancer Neg Hx   . Stomach cancer Neg Hx     Allergies  Allergen Reactions  . Metformin Nausea Only    Medication list has been reviewed and updated.  Current Outpatient Prescriptions on File Prior to Visit  Medication Sig Dispense Refill  . acyclovir (ZOVIRAX) 400 MG tablet TAKE  1 TABLET BY MOUTH 3 TIMES DAILY FOR COLD SORES 21 tablet 3  . amLODipine (NORVASC) 5 MG tablet Take 1.5 tablets (7.5 mg total) by mouth daily. 135 tablet 3  . aspirin 81 MG EC tablet Take 81 mg by mouth daily.     . bisacodyl (DULCOLAX) 5 MG EC tablet Take 5 mg by mouth daily as needed for moderate constipation. Dulcolax 5 mg tab take as directed for colonoscopy prep.    . cholecalciferol (VITAMIN D) 1000 UNITS tablet daily.    . Cyanocobalamin (VITAMIN B12 PO) daily.    Marland Kitchen glimepiride (AMARYL) 2 MG tablet Take 2 tablets (4 mg total) by mouth daily before breakfast. 180 tablet 3  . ibuprofen (ADVIL,MOTRIN) 800 MG tablet Take 1 tablet (800 mg total) by mouth 3 (three) times daily as needed. 90 tablet 1  . losartan (COZAAR) 100 MG tablet Take 1 tablet (100 mg total) by mouth daily. 90 tablet 3  . lovastatin (MEVACOR) 20 MG tablet Take 1 tablet (20 mg  total) by mouth at bedtime. 90 tablet 3  . metaxalone (SKELAXIN) 800 MG tablet Take 1 tablet (800 mg total) by mouth 3 (three) times daily as needed. 100 tablet 1  . pioglitazone (ACTOS) 45 MG tablet Take 1 tablet (45 mg total) by mouth daily. 90 tablet 3  . traMADol (ULTRAM) 50 MG tablet Take 1 tablet (50 mg total) by mouth every 8 (eight) hours as needed. 30 tablet 0  . vitamin B-12 (CYANOCOBALAMIN) 500 MCG tablet Take 500 mcg by mouth daily.      No current facility-administered medications on file prior to visit.     ROS ROS otherwise unremarkable unless listed above.  Physical Examination: BP 110/68   Pulse 63   Temp 97.6 F (36.4 C) (Oral)   Resp 17   Ht 5' 6.5" (1.689 m)   Wt 169 lb (76.7 kg)   SpO2 97%   BMI 26.87 kg/m  Ideal Body Weight: Weight in (lb) to have BMI = 25: 156.9  Physical Exam  Constitutional: He is oriented to person, place, and time. He appears well-developed and well-nourished. No distress.  HENT:  Head: Normocephalic and atraumatic.  Eyes: Conjunctivae and EOM are normal. Pupils are equal, round, and reactive to light.  Cardiovascular: Normal rate.   Pulmonary/Chest: Effort normal. No respiratory distress.  Neurological: He is alert and oriented to person, place, and time.  Skin: Skin is warm and dry. He is not diaphoretic.  Erythematous ulcerative lesions along the chin.  One pustule along the chin.  Consistent lesion at the left side.  Psychiatric: He has a normal mood and affect. His behavior is normal.     Assessment and Plan: Jacob Chapman is a 71 y.o. male who is here today for rash. rtc if sxs do not improve within 2 weeks. Facial rash - Plan: mupirocin ointment (BACTROBAN) 2 %, doxycycline (VIBRAMYCIN) 100 MG capsule  Ivar Drape, PA-C [facility] 4/8/20188:51 PM

## 2016-08-10 DIAGNOSIS — L718 Other rosacea: Secondary | ICD-10-CM | POA: Diagnosis not present

## 2016-08-10 DIAGNOSIS — D179 Benign lipomatous neoplasm, unspecified: Secondary | ICD-10-CM | POA: Diagnosis not present

## 2016-08-24 DIAGNOSIS — E119 Type 2 diabetes mellitus without complications: Secondary | ICD-10-CM | POA: Diagnosis not present

## 2016-08-24 DIAGNOSIS — H2513 Age-related nuclear cataract, bilateral: Secondary | ICD-10-CM | POA: Diagnosis not present

## 2016-08-24 DIAGNOSIS — H524 Presbyopia: Secondary | ICD-10-CM | POA: Diagnosis not present

## 2016-08-24 DIAGNOSIS — H25013 Cortical age-related cataract, bilateral: Secondary | ICD-10-CM | POA: Diagnosis not present

## 2016-08-24 LAB — HM DIABETES EYE EXAM

## 2016-09-03 ENCOUNTER — Encounter: Payer: Self-pay | Admitting: Internal Medicine

## 2016-09-22 ENCOUNTER — Other Ambulatory Visit (INDEPENDENT_AMBULATORY_CARE_PROVIDER_SITE_OTHER): Payer: Medicare HMO

## 2016-09-22 ENCOUNTER — Ambulatory Visit: Payer: Medicare HMO | Admitting: Internal Medicine

## 2016-09-22 DIAGNOSIS — I10 Essential (primary) hypertension: Secondary | ICD-10-CM

## 2016-09-22 DIAGNOSIS — R209 Unspecified disturbances of skin sensation: Secondary | ICD-10-CM

## 2016-09-22 DIAGNOSIS — E785 Hyperlipidemia, unspecified: Secondary | ICD-10-CM

## 2016-09-22 DIAGNOSIS — E119 Type 2 diabetes mellitus without complications: Secondary | ICD-10-CM

## 2016-09-22 LAB — BASIC METABOLIC PANEL
BUN: 14 mg/dL (ref 6–23)
CO2: 25 meq/L (ref 19–32)
Calcium: 9.4 mg/dL (ref 8.4–10.5)
Chloride: 108 mEq/L (ref 96–112)
Creatinine, Ser: 0.97 mg/dL (ref 0.40–1.50)
GFR: 81.64 mL/min (ref >60.00)
GLUCOSE: 128 mg/dL — AB (ref 70–99)
Potassium: 4.5 mEq/L (ref 3.5–5.1)
Sodium: 141 mEq/L (ref 135–145)

## 2016-09-22 LAB — LIPID PANEL
Cholesterol: 124 mg/dL (ref 0–200)
HDL: 47.8 mg/dL (ref >39.00)
LDL CALC: 63 mg/dL (ref 0–99)
NONHDL: 75.74
Total CHOL/HDL Ratio: 3
Triglycerides: 62 mg/dL (ref 0.0–149.0)
VLDL: 12.4 mg/dL (ref 0.0–40.0)

## 2016-09-22 LAB — HEPATIC FUNCTION PANEL
ALBUMIN: 4.1 g/dL (ref 3.5–5.2)
ALK PHOS: 71 U/L (ref 39–117)
ALT: 15 U/L (ref 0–53)
AST: 17 U/L (ref 0–37)
BILIRUBIN DIRECT: 0.1 mg/dL (ref 0.0–0.3)
TOTAL PROTEIN: 6.9 g/dL (ref 6.0–8.3)
Total Bilirubin: 0.5 mg/dL (ref 0.2–1.2)

## 2016-09-22 LAB — HEMOGLOBIN A1C: HEMOGLOBIN A1C: 7.1 % — AB (ref 4.6–6.5)

## 2016-09-29 ENCOUNTER — Ambulatory Visit (INDEPENDENT_AMBULATORY_CARE_PROVIDER_SITE_OTHER): Payer: Medicare HMO | Admitting: Internal Medicine

## 2016-09-29 ENCOUNTER — Encounter: Payer: Self-pay | Admitting: Internal Medicine

## 2016-09-29 VITALS — BP 116/74 | HR 58 | Temp 97.9°F | Ht 66.5 in | Wt 165.0 lb

## 2016-09-29 DIAGNOSIS — E785 Hyperlipidemia, unspecified: Secondary | ICD-10-CM

## 2016-09-29 DIAGNOSIS — Z23 Encounter for immunization: Secondary | ICD-10-CM

## 2016-09-29 DIAGNOSIS — M545 Low back pain, unspecified: Secondary | ICD-10-CM

## 2016-09-29 DIAGNOSIS — E119 Type 2 diabetes mellitus without complications: Secondary | ICD-10-CM | POA: Diagnosis not present

## 2016-09-29 DIAGNOSIS — I10 Essential (primary) hypertension: Secondary | ICD-10-CM

## 2016-09-29 NOTE — Progress Notes (Signed)
Subjective:  Patient ID: Jacob Chapman, male    DOB: 1945-05-27  Age: 71 y.o. MRN: [id number]  CC: No chief complaint on file.   HPI Jacob Chapman presents for DM, OA, B12 def f/u  Outpatient Medications Prior to Visit  Medication Sig Dispense Refill  . acyclovir (ZOVIRAX) 400 MG tablet TAKE 1 TABLET BY MOUTH 3 TIMES DAILY FOR COLD SORES 21 tablet 3  . amLODipine (NORVASC) 5 MG tablet Take 1.5 tablets (7.5 mg total) by mouth daily. 135 tablet 3  . aspirin 81 MG EC tablet Take 81 mg by mouth daily.     . bisacodyl (DULCOLAX) 5 MG EC tablet Take 5 mg by mouth daily as needed for moderate constipation. Dulcolax 5 mg tab take as directed for colonoscopy prep.    . cholecalciferol (VITAMIN D) 1000 UNITS tablet daily.    . Cyanocobalamin (VITAMIN B12 PO) daily.    Marland Kitchen glimepiride (AMARYL) 2 MG tablet Take 2 tablets (4 mg total) by mouth daily before breakfast. 180 tablet 3  . ibuprofen (ADVIL,MOTRIN) 800 MG tablet Take 1 tablet (800 mg total) by mouth 3 (three) times daily as needed. 90 tablet 1  . losartan (COZAAR) 100 MG tablet Take 1 tablet (100 mg total) by mouth daily. 90 tablet 3  . lovastatin (MEVACOR) 20 MG tablet Take 1 tablet (20 mg total) by mouth at bedtime. 90 tablet 3  . metaxalone (SKELAXIN) 800 MG tablet Take 1 tablet (800 mg total) by mouth 3 (three) times daily as needed. 100 tablet 1  . mupirocin ointment (BACTROBAN) 2 % Apply 1 application topically 2 (two) times daily. 22 g 1  . pioglitazone (ACTOS) 45 MG tablet Take 1 tablet (45 mg total) by mouth daily. 90 tablet 3  . traMADol (ULTRAM) 50 MG tablet Take 1 tablet (50 mg total) by mouth every 8 (eight) hours as needed. 30 tablet 0  . vitamin B-12 (CYANOCOBALAMIN) 500 MCG tablet Take 500 mcg by mouth daily.      No facility-administered medications prior to visit.     ROS Review of Systems  Constitutional: Negative for appetite change, fatigue and unexpected weight change.  HENT: Negative for congestion, nosebleeds,  sneezing, sore throat and trouble swallowing.   Eyes: Negative for itching and visual disturbance.  Respiratory: Negative for cough.   Cardiovascular: Negative for chest pain, palpitations and leg swelling.  Gastrointestinal: Negative for abdominal distention, blood in stool, diarrhea and nausea.  Genitourinary: Negative for frequency and hematuria.  Musculoskeletal: Negative for back pain, gait problem, joint swelling and neck pain.  Skin: Negative for rash.  Neurological: Negative for dizziness, tremors, speech difficulty and weakness.  Psychiatric/Behavioral: Negative for agitation, dysphoric mood and sleep disturbance. The patient is not nervous/anxious.     Objective:  BP 116/74 (BP Location: Left Arm, Patient Position: Sitting, Cuff Size: Normal)   Pulse (!) 58   Temp 97.9 F (36.6 C) (Oral)   Ht 5' 6.5" (1.689 m)   Wt 165 lb (74.8 kg)   SpO2 99%   BMI 26.23 kg/m   BP Readings from Last 3 Encounters:  09/29/16 116/74  07/25/16 110/68  05/26/16 122/78    Wt Readings from Last 3 Encounters:  09/29/16 165 lb (74.8 kg)  07/25/16 169 lb (76.7 kg)  05/26/16 167 lb 12 oz (76.1 kg)    Physical Exam  Constitutional: He is oriented to person, place, and time. He appears well-developed. No distress.  NAD  HENT:  Mouth/Throat: Oropharynx is clear  and moist.  Eyes: Conjunctivae are normal. Pupils are equal, round, and reactive to light.  Neck: Normal range of motion. No JVD present. No thyromegaly present.  Cardiovascular: Normal rate, regular rhythm, normal heart sounds and intact distal pulses.  Exam reveals no gallop and no friction rub.   No murmur heard. Pulmonary/Chest: Effort normal and breath sounds normal. No respiratory distress. He has no wheezes. He has no rales. He exhibits no tenderness.  Abdominal: Soft. Bowel sounds are normal. He exhibits no distension and no mass. There is no tenderness. There is no rebound and no guarding.  Musculoskeletal: Normal range of  motion. He exhibits no edema or tenderness.  Lymphadenopathy:    He has no cervical adenopathy.  Neurological: He is alert and oriented to person, place, and time. He has normal reflexes. No cranial nerve deficit. He exhibits normal muscle tone. He displays a negative Romberg sign. Coordination and gait normal.  Skin: Skin is warm and dry. No rash noted.  Psychiatric: He has a normal mood and affect. His behavior is normal. Judgment and thought content normal.    Lab Results  Component Value Date   WBC 8.3 09/20/2014   HGB 14.4 09/20/2014   HCT 43.4 (A) 09/20/2014   PLT 180 10/30/2011   GLUCOSE 128 (H) 09/22/2016   CHOL 124 09/22/2016   TRIG 62.0 09/22/2016   HDL 47.80 09/22/2016   LDLCALC 63 09/22/2016   ALT 15 09/22/2016   AST 17 09/22/2016   NA 141 09/22/2016   K 4.5 09/22/2016   CL 108 09/22/2016   CREATININE 0.97 09/22/2016   BUN 14 09/22/2016   CO2 25 09/22/2016   TSH 2.115 09/20/2014   PSA 0.3 01/30/2016   INR CANCELED 01/18/2014   HGBA1C 7.1 (H) 09/22/2016    US Pelvis Limited  Result Date: 01/30/2016 CLINICAL DATA:  Left groin pain for 3 days after heavy lifting, assessment for hernia. EXAM: LIMITED ULTRASOUND OF PELVIS TECHNIQUE: Limited transabdominal ultrasound examination of the pelvis was performed. COMPARISON:  11/08/2009 CT pelvis FINDINGS: Assessment of the left groin/ inguinal region demonstrates no hernia. Comparison images of the right-side appear symmetric. IMPRESSION: 1. No groin hernia is demonstrated on the left side. Electronically Signed   By: Van Clines M.D.   On: 01/30/2016 16:21    Assessment & Plan:   There are no diagnoses linked to this encounter. I am having Mr. Amezcua maintain his aspirin, vitamin B-12, metaxalone, Cyanocobalamin (VITAMIN B12 PO), cholecalciferol, bisacodyl, traMADol, amLODipine, ibuprofen, lovastatin, acyclovir, glimepiride, losartan, pioglitazone, and mupirocin ointment.  No orders of the defined types were placed  in this encounter.    Follow-up: No Follow-up on file.  Walker Kehr, MD

## 2016-09-29 NOTE — Assessment & Plan Note (Signed)
Doing well 

## 2016-09-29 NOTE — Addendum Note (Signed)
Addended by: Karren Cobble on: 09/29/2016 10:23 AM   Modules accepted: Orders

## 2016-09-29 NOTE — Assessment & Plan Note (Signed)
On Lovastatin

## 2016-09-29 NOTE — Assessment & Plan Note (Signed)
Cont with Amlodipine and Losartan

## 2016-09-29 NOTE — Assessment & Plan Note (Signed)
Cont with Actos and Amaryl We discussed adding another Rx - he declined

## 2016-12-02 ENCOUNTER — Other Ambulatory Visit: Payer: Self-pay | Admitting: Internal Medicine

## 2016-12-11 ENCOUNTER — Other Ambulatory Visit: Payer: Self-pay | Admitting: Internal Medicine

## 2017-02-02 ENCOUNTER — Ambulatory Visit: Payer: Medicare HMO | Admitting: Internal Medicine

## 2017-02-03 ENCOUNTER — Other Ambulatory Visit: Payer: Self-pay | Admitting: Internal Medicine

## 2017-02-09 ENCOUNTER — Other Ambulatory Visit (INDEPENDENT_AMBULATORY_CARE_PROVIDER_SITE_OTHER): Payer: Medicare HMO

## 2017-02-09 DIAGNOSIS — R209 Unspecified disturbances of skin sensation: Secondary | ICD-10-CM

## 2017-02-09 DIAGNOSIS — E119 Type 2 diabetes mellitus without complications: Secondary | ICD-10-CM

## 2017-02-09 DIAGNOSIS — M109 Gout, unspecified: Secondary | ICD-10-CM | POA: Diagnosis not present

## 2017-02-09 DIAGNOSIS — I10 Essential (primary) hypertension: Secondary | ICD-10-CM | POA: Diagnosis not present

## 2017-02-09 DIAGNOSIS — E785 Hyperlipidemia, unspecified: Secondary | ICD-10-CM | POA: Diagnosis not present

## 2017-02-09 LAB — BASIC METABOLIC PANEL
BUN: 17 mg/dL (ref 6–23)
CALCIUM: 9.1 mg/dL (ref 8.4–10.5)
CHLORIDE: 108 meq/L (ref 96–112)
CO2: 27 meq/L (ref 19–32)
Creatinine, Ser: 0.96 mg/dL (ref 0.40–1.50)
GFR: 82.53 mL/min (ref >60.00)
GLUCOSE: 118 mg/dL — AB (ref 70–99)
POTASSIUM: 4.6 meq/L (ref 3.5–5.1)
SODIUM: 141 meq/L (ref 135–145)

## 2017-02-09 LAB — HEPATIC FUNCTION PANEL
ALT: 15 U/L (ref 0–53)
AST: 18 U/L (ref 0–37)
Albumin: 3.8 g/dL (ref 3.5–5.2)
Alkaline Phosphatase: 60 U/L (ref 39–117)
BILIRUBIN DIRECT: 0.1 mg/dL (ref 0.0–0.3)
BILIRUBIN TOTAL: 0.5 mg/dL (ref 0.2–1.2)
Total Protein: 6.4 g/dL (ref 6.0–8.3)

## 2017-02-09 LAB — HEMOGLOBIN A1C: HEMOGLOBIN A1C: 6.9 % — AB (ref 4.6–6.5)

## 2017-02-09 LAB — LIPID PANEL
CHOL/HDL RATIO: 2
Cholesterol: 114 mg/dL (ref 0–200)
HDL: 47.6 mg/dL (ref >39.00)
LDL CALC: 56 mg/dL (ref 0–99)
NONHDL: 66.26
Triglycerides: 49 mg/dL (ref 0.0–149.0)
VLDL: 9.8 mg/dL (ref 0.0–40.0)

## 2017-02-17 ENCOUNTER — Ambulatory Visit (INDEPENDENT_AMBULATORY_CARE_PROVIDER_SITE_OTHER): Payer: Medicare HMO | Admitting: Internal Medicine

## 2017-02-17 ENCOUNTER — Encounter: Payer: Self-pay | Admitting: Internal Medicine

## 2017-02-17 VITALS — BP 114/72 | HR 53 | Temp 97.6°F | Ht 66.5 in | Wt 166.0 lb

## 2017-02-17 DIAGNOSIS — M545 Low back pain, unspecified: Secondary | ICD-10-CM

## 2017-02-17 DIAGNOSIS — E785 Hyperlipidemia, unspecified: Secondary | ICD-10-CM

## 2017-02-17 DIAGNOSIS — E119 Type 2 diabetes mellitus without complications: Secondary | ICD-10-CM

## 2017-02-17 DIAGNOSIS — I10 Essential (primary) hypertension: Secondary | ICD-10-CM

## 2017-02-17 DIAGNOSIS — Z23 Encounter for immunization: Secondary | ICD-10-CM | POA: Diagnosis not present

## 2017-02-17 DIAGNOSIS — F4321 Adjustment disorder with depressed mood: Secondary | ICD-10-CM

## 2017-02-17 MED ORDER — PIOGLITAZONE HCL 45 MG PO TABS: 45.0000 mg | ORAL_TABLET | Freq: Every day | ORAL | 3 refills | Status: DC

## 2017-02-17 MED ORDER — GLIMEPIRIDE 2 MG PO TABS: 4.0000 mg | ORAL_TABLET | Freq: Every day | ORAL | 3 refills | Status: DC

## 2017-02-17 MED ORDER — LOSARTAN POTASSIUM 100 MG PO TABS: 100.0000 mg | ORAL_TABLET | Freq: Every day | ORAL | 3 refills | Status: DC

## 2017-02-17 MED ORDER — AMLODIPINE BESYLATE 5 MG PO TABS: ORAL_TABLET | ORAL | 3 refills | Status: DC

## 2017-02-17 MED ORDER — IBUPROFEN 800 MG PO TABS: 800.0000 mg | ORAL_TABLET | Freq: Three times a day (TID) | ORAL | 1 refills | Status: DC | PRN

## 2017-02-17 MED ORDER — LOVASTATIN 20 MG PO TABS: 20.0000 mg | ORAL_TABLET | Freq: Every day | ORAL | 3 refills | Status: DC

## 2017-02-17 NOTE — Assessment & Plan Note (Signed)
Lovastatin 

## 2017-02-17 NOTE — Assessment & Plan Note (Signed)
Actos and Amaryl

## 2017-02-17 NOTE — Progress Notes (Signed)
Subjective:  Patient ID: Jacob Chapman, male    DOB: 09/03/1945  Age: 71 y.o. MRN: [id number]  CC: No chief complaint on file.   HPI Jacob Chapman presents for DM, HTN, OA f/u - doing good  Outpatient Medications Prior to Visit  Medication Sig Dispense Refill  . acyclovir (ZOVIRAX) 400 MG tablet TAKE 1 TABLET BY MOUTH 3 TIMES DAILY FOR COLD SORES 21 tablet 3  . amLODipine (NORVASC) 5 MG tablet TAKE 1 AND 1/2 TABLETS EVERY DAY 135 tablet 1  . aspirin 81 MG EC tablet Take 81 mg by mouth daily.     . cholecalciferol (VITAMIN D) 1000 UNITS tablet daily.    . Cyanocobalamin (VITAMIN B12 PO) daily.    Marland Kitchen glimepiride (AMARYL) 2 MG tablet Take 2 tablets (4 mg total) by mouth daily before breakfast. 180 tablet 3  . ibuprofen (ADVIL,MOTRIN) 800 MG tablet Take 1 tablet (800 mg total) by mouth 3 (three) times daily as needed. 90 tablet 1  . losartan (COZAAR) 100 MG tablet Take 1 tablet (100 mg total) by mouth daily. 90 tablet 3  . lovastatin (MEVACOR) 20 MG tablet Take 1 tablet (20 mg total) by mouth at bedtime. 90 tablet 3  . metaxalone (SKELAXIN) 800 MG tablet Take 1 tablet (800 mg total) by mouth 3 (three) times daily as needed. 100 tablet 1  . mupirocin ointment (BACTROBAN) 2 % Apply 1 application topically 2 (two) times daily. 22 g 1  . pioglitazone (ACTOS) 45 MG tablet Take 1 tablet (45 mg total) by mouth daily. 90 tablet 3  . vitamin B-12 (CYANOCOBALAMIN) 500 MCG tablet Take 500 mcg by mouth daily.     . bisacodyl (DULCOLAX) 5 MG EC tablet Take 5 mg by mouth daily as needed for moderate constipation. Dulcolax 5 mg tab take as directed for colonoscopy prep.    . traMADol (ULTRAM) 50 MG tablet Take 1 tablet (50 mg total) by mouth every 8 (eight) hours as needed. (Patient not taking: Reported on 02/17/2017) 30 tablet 0   No facility-administered medications prior to visit.     ROS Review of Systems  Constitutional: Negative for appetite change, fatigue and unexpected weight change.    HENT: Negative for congestion, nosebleeds, sneezing, sore throat and trouble swallowing.   Eyes: Negative for itching and visual disturbance.  Respiratory: Negative for cough.   Cardiovascular: Negative for chest pain, palpitations and leg swelling.  Gastrointestinal: Negative for abdominal distention, blood in stool, diarrhea and nausea.  Genitourinary: Negative for frequency and hematuria.  Musculoskeletal: Positive for back pain. Negative for gait problem, joint swelling and neck pain.  Skin: Negative for rash.  Neurological: Negative for dizziness, tremors, speech difficulty and weakness.  Psychiatric/Behavioral: Negative for agitation, dysphoric mood and sleep disturbance. The patient is not nervous/anxious.     Objective:  BP 114/72 (BP Location: Left Arm, Patient Position: Sitting, Cuff Size: Normal)   Pulse (!) 53   Temp 97.6 F (36.4 C) (Oral)   Ht 5' 6.5" (1.689 m)   Wt 166 lb (75.3 kg)   SpO2 99%   BMI 26.39 kg/m   BP Readings from Last 3 Encounters:  02/17/17 114/72  09/29/16 116/74  07/25/16 110/68    Wt Readings from Last 3 Encounters:  02/17/17 166 lb (75.3 kg)  09/29/16 165 lb (74.8 kg)  07/25/16 169 lb (76.7 kg)    Physical Exam  Constitutional: He is oriented to person, place, and time. He appears well-developed. No distress.  NAD  HENT:  Mouth/Throat: Oropharynx is clear and moist.  Eyes: Pupils are equal, round, and reactive to light. Conjunctivae are normal.  Neck: Normal range of motion. No JVD present. No thyromegaly present.  Cardiovascular: Normal rate, regular rhythm, normal heart sounds and intact distal pulses.  Exam reveals no gallop and no friction rub.   No murmur heard. Pulmonary/Chest: Effort normal and breath sounds normal. No respiratory distress. He has no wheezes. He has no rales. He exhibits no tenderness.  Abdominal: Soft. Bowel sounds are normal. He exhibits no distension and no mass. There is no tenderness. There is no rebound  and no guarding.  Musculoskeletal: Normal range of motion. He exhibits no edema or tenderness.  Lymphadenopathy:    He has no cervical adenopathy.  Neurological: He is alert and oriented to person, place, and time. He has normal reflexes. No cranial nerve deficit. He exhibits normal muscle tone. He displays a negative Romberg sign. Coordination and gait normal.  Skin: Skin is warm and dry. No rash noted.  Psychiatric: He has a normal mood and affect. His behavior is normal. Judgment and thought content normal.    Lab Results  Component Value Date   WBC 8.3 09/20/2014   HGB 14.4 09/20/2014   HCT 43.4 (A) 09/20/2014   PLT 180 10/30/2011   GLUCOSE 118 (H) 02/09/2017   CHOL 114 02/09/2017   TRIG 49.0 02/09/2017   HDL 47.60 02/09/2017   LDLCALC 56 02/09/2017   ALT 15 02/09/2017   AST 18 02/09/2017   NA 141 02/09/2017   K 4.6 02/09/2017   CL 108 02/09/2017   CREATININE 0.96 02/09/2017   BUN 17 02/09/2017   CO2 27 02/09/2017   TSH 2.115 09/20/2014   PSA 0.3 01/30/2016   INR CANCELED 01/18/2014   HGBA1C 6.9 (H) 02/09/2017    US Pelvis Limited  Result Date: 01/30/2016 CLINICAL DATA:  Left groin pain for 3 days after heavy lifting, assessment for hernia. EXAM: LIMITED ULTRASOUND OF PELVIS TECHNIQUE: Limited transabdominal ultrasound examination of the pelvis was performed. COMPARISON:  11/08/2009 CT pelvis FINDINGS: Assessment of the left groin/ inguinal region demonstrates no hernia. Comparison images of the right-side appear symmetric. IMPRESSION: 1. No groin hernia is demonstrated on the left side. Electronically Signed   By: Van Clines M.D.   On: 01/30/2016 16:21    Assessment & Plan:   There are no diagnoses linked to this encounter. I have discontinued Mr. Swetz bisacodyl and traMADol. I am also having him maintain his aspirin, vitamin B-12, metaxalone, Cyanocobalamin (VITAMIN B12 PO), cholecalciferol, ibuprofen, lovastatin, acyclovir, glimepiride, losartan,  pioglitazone, mupirocin ointment, and amLODipine.  No orders of the defined types were placed in this encounter.    Follow-up: No Follow-up on file.  Walker Kehr, MD

## 2017-02-17 NOTE — Assessment & Plan Note (Signed)
Coping OK 

## 2017-02-17 NOTE — Patient Instructions (Signed)
MC well w/Jill 

## 2017-02-17 NOTE — Assessment & Plan Note (Signed)
Amlodipine and Losartan

## 2017-02-17 NOTE — Assessment & Plan Note (Signed)
Ibuprofen prn pc °

## 2017-02-24 NOTE — Addendum Note (Signed)
Addended by: Karren Cobble on: 02/24/2017 01:32 PM   Modules accepted: Orders

## 2017-03-16 DIAGNOSIS — C61 Malignant neoplasm of prostate: Secondary | ICD-10-CM | POA: Diagnosis not present

## 2017-03-23 DIAGNOSIS — Z8546 Personal history of malignant neoplasm of prostate: Secondary | ICD-10-CM | POA: Diagnosis not present

## 2017-06-15 ENCOUNTER — Other Ambulatory Visit (INDEPENDENT_AMBULATORY_CARE_PROVIDER_SITE_OTHER): Payer: Medicare HMO

## 2017-06-15 DIAGNOSIS — R209 Unspecified disturbances of skin sensation: Secondary | ICD-10-CM | POA: Diagnosis not present

## 2017-06-15 DIAGNOSIS — E119 Type 2 diabetes mellitus without complications: Secondary | ICD-10-CM | POA: Diagnosis not present

## 2017-06-15 DIAGNOSIS — M109 Gout, unspecified: Secondary | ICD-10-CM | POA: Diagnosis not present

## 2017-06-15 DIAGNOSIS — I10 Essential (primary) hypertension: Secondary | ICD-10-CM | POA: Diagnosis not present

## 2017-06-15 DIAGNOSIS — E785 Hyperlipidemia, unspecified: Secondary | ICD-10-CM

## 2017-06-15 LAB — HEPATIC FUNCTION PANEL
ALK PHOS: 65 U/L (ref 39–117)
ALT: 18 U/L (ref 0–53)
AST: 18 U/L (ref 0–37)
Albumin: 3.8 g/dL (ref 3.5–5.2)
BILIRUBIN DIRECT: 0.1 mg/dL (ref 0.0–0.3)
TOTAL PROTEIN: 6.5 g/dL (ref 6.0–8.3)
Total Bilirubin: 0.5 mg/dL (ref 0.2–1.2)

## 2017-06-15 LAB — BASIC METABOLIC PANEL
BUN: 15 mg/dL (ref 6–23)
CO2: 27 meq/L (ref 19–32)
Calcium: 9.3 mg/dL (ref 8.4–10.5)
Chloride: 106 mEq/L (ref 96–112)
Creatinine, Ser: 0.96 mg/dL (ref 0.40–1.50)
GFR: 82.45 mL/min (ref >60.00)
GLUCOSE: 129 mg/dL — AB (ref 70–99)
POTASSIUM: 4.3 meq/L (ref 3.5–5.1)
SODIUM: 139 meq/L (ref 135–145)

## 2017-06-15 LAB — LIPID PANEL
CHOL/HDL RATIO: 3
Cholesterol: 120 mg/dL (ref 0–200)
HDL: 43.4 mg/dL (ref >39.00)
LDL Cholesterol: 63 mg/dL (ref 0–99)
NONHDL: 77.09
Triglycerides: 68 mg/dL (ref 0.0–149.0)
VLDL: 13.6 mg/dL (ref 0.0–40.0)

## 2017-06-15 LAB — HEMOGLOBIN A1C: Hgb A1c MFr Bld: 6.9 % — ABNORMAL HIGH (ref 4.6–6.5)

## 2017-06-22 ENCOUNTER — Ambulatory Visit (INDEPENDENT_AMBULATORY_CARE_PROVIDER_SITE_OTHER): Payer: Medicare HMO | Admitting: Internal Medicine

## 2017-06-22 ENCOUNTER — Encounter: Payer: Self-pay | Admitting: Internal Medicine

## 2017-06-22 VITALS — BP 112/72 | HR 57 | Temp 97.7°F | Ht 66.5 in | Wt 164.0 lb

## 2017-06-22 DIAGNOSIS — E785 Hyperlipidemia, unspecified: Secondary | ICD-10-CM

## 2017-06-22 DIAGNOSIS — E119 Type 2 diabetes mellitus without complications: Secondary | ICD-10-CM | POA: Diagnosis not present

## 2017-06-22 DIAGNOSIS — F4321 Adjustment disorder with depressed mood: Secondary | ICD-10-CM | POA: Diagnosis not present

## 2017-06-22 DIAGNOSIS — I10 Essential (primary) hypertension: Secondary | ICD-10-CM | POA: Diagnosis not present

## 2017-06-22 DIAGNOSIS — C61 Malignant neoplasm of prostate: Secondary | ICD-10-CM | POA: Diagnosis not present

## 2017-06-22 NOTE — Assessment & Plan Note (Signed)
F/u w/Urology 

## 2017-06-22 NOTE — Assessment & Plan Note (Signed)
Actos and Amaryl

## 2017-06-22 NOTE — Progress Notes (Signed)
Subjective:  Patient ID: Jacob Chapman, male    DOB: 30-Nov-1945  Age: 72 y.o. MRN: [id number]  CC: No chief complaint on file.   HPI Jacob Chapman presents for DM, HTN, B12 def f/u  Outpatient Medications Prior to Visit  Medication Sig Dispense Refill  . acyclovir (ZOVIRAX) 400 MG tablet TAKE 1 TABLET BY MOUTH 3 TIMES DAILY FOR COLD SORES 21 tablet 3  . amLODipine (NORVASC) 5 MG tablet TAKE 1 AND 1/2 TABLETS EVERY DAY 135 tablet 3  . aspirin 81 MG EC tablet Take 81 mg by mouth daily.     . cholecalciferol (VITAMIN D) 1000 UNITS tablet daily.    . Cyanocobalamin (VITAMIN B12 PO) daily.    Marland Kitchen glimepiride (AMARYL) 2 MG tablet Take 2 tablets (4 mg total) by mouth daily before breakfast. 180 tablet 3  . ibuprofen (ADVIL,MOTRIN) 800 MG tablet Take 1 tablet (800 mg total) by mouth 3 (three) times daily as needed. 90 tablet 1  . losartan (COZAAR) 100 MG tablet Take 1 tablet (100 mg total) by mouth daily. 90 tablet 3  . lovastatin (MEVACOR) 20 MG tablet Take 1 tablet (20 mg total) by mouth at bedtime. 90 tablet 3  . metaxalone (SKELAXIN) 800 MG tablet Take 1 tablet (800 mg total) by mouth 3 (three) times daily as needed. 100 tablet 1  . mupirocin ointment (BACTROBAN) 2 % Apply 1 application topically 2 (two) times daily. 22 g 1  . pioglitazone (ACTOS) 45 MG tablet Take 1 tablet (45 mg total) by mouth daily. 90 tablet 3  . vitamin B-12 (CYANOCOBALAMIN) 500 MCG tablet Take 500 mcg by mouth daily.      No facility-administered medications prior to visit.     ROS Review of Systems  Constitutional: Negative for appetite change, fatigue and unexpected weight change.  HENT: Negative for congestion, nosebleeds, sneezing, sore throat and trouble swallowing.   Eyes: Negative for itching and visual disturbance.  Respiratory: Negative for cough.   Cardiovascular: Negative for chest pain, palpitations and leg swelling.  Gastrointestinal: Negative for abdominal distention, blood in stool, diarrhea  and nausea.  Genitourinary: Negative for frequency and hematuria.  Musculoskeletal: Negative for back pain, gait problem, joint swelling and neck pain.  Skin: Negative for rash.  Neurological: Negative for dizziness, tremors, speech difficulty and weakness.  Psychiatric/Behavioral: Negative for agitation, dysphoric mood and sleep disturbance. The patient is not nervous/anxious.     Objective:  BP 112/72 (BP Location: Left Arm, Patient Position: Sitting, Cuff Size: Normal)   Pulse (!) 57   Temp 97.7 F (36.5 C) (Oral)   Ht 5' 6.5" (1.689 m)   Wt 164 lb (74.4 kg)   SpO2 99%   BMI 26.07 kg/m   BP Readings from Last 3 Encounters:  06/22/17 112/72  02/17/17 114/72  09/29/16 116/74    Wt Readings from Last 3 Encounters:  06/22/17 164 lb (74.4 kg)  02/17/17 166 lb (75.3 kg)  09/29/16 165 lb (74.8 kg)    Physical Exam  Constitutional: He is oriented to person, place, and time. He appears well-developed. No distress.  NAD  HENT:  Mouth/Throat: Oropharynx is clear and moist.  Eyes: Conjunctivae are normal. Pupils are equal, round, and reactive to light.  Neck: Normal range of motion. No JVD present. No thyromegaly present.  Cardiovascular: Normal rate, regular rhythm, normal heart sounds and intact distal pulses. Exam reveals no gallop and no friction rub.  No murmur heard. Pulmonary/Chest: Effort normal and breath sounds normal.  No respiratory distress. He has no wheezes. He has no rales. He exhibits no tenderness.  Abdominal: Soft. Bowel sounds are normal. He exhibits no distension and no mass. There is no tenderness. There is no rebound and no guarding.  Musculoskeletal: Normal range of motion. He exhibits no edema or tenderness.  Lymphadenopathy:    He has no cervical adenopathy.  Neurological: He is alert and oriented to person, place, and time. He has normal reflexes. No cranial nerve deficit. He exhibits normal muscle tone. He displays a negative Romberg sign. Coordination  and gait normal.  Skin: Skin is warm and dry. No rash noted.  Psychiatric: He has a normal mood and affect. His behavior is normal. Judgment and thought content normal.    Lab Results  Component Value Date   WBC 8.3 09/20/2014   HGB 14.4 09/20/2014   HCT 43.4 (A) 09/20/2014   PLT 180 10/30/2011   GLUCOSE 129 (H) 06/15/2017   CHOL 120 06/15/2017   TRIG 68.0 06/15/2017   HDL 43.40 06/15/2017   LDLCALC 63 06/15/2017   ALT 18 06/15/2017   AST 18 06/15/2017   NA 139 06/15/2017   K 4.3 06/15/2017   CL 106 06/15/2017   CREATININE 0.96 06/15/2017   BUN 15 06/15/2017   CO2 27 06/15/2017   TSH 2.115 09/20/2014   PSA 0.3 01/30/2016   INR CANCELED 01/18/2014   HGBA1C 6.9 (H) 06/15/2017    US Pelvis Limited  Result Date: 01/30/2016 CLINICAL DATA:  Left groin pain for 3 days after heavy lifting, assessment for hernia. EXAM: LIMITED ULTRASOUND OF PELVIS TECHNIQUE: Limited transabdominal ultrasound examination of the pelvis was performed. COMPARISON:  11/08/2009 CT pelvis FINDINGS: Assessment of the left groin/ inguinal region demonstrates no hernia. Comparison images of the right-side appear symmetric. IMPRESSION: 1. No groin hernia is demonstrated on the left side. Electronically Signed   By: Van Clines M.D.   On: 01/30/2016 16:21    Assessment & Plan:   There are no diagnoses linked to this encounter. I am having Gwyndolyn Saxon A. Bitton maintain his aspirin, vitamin B-12, metaxalone, Cyanocobalamin (VITAMIN B12 PO), cholecalciferol, acyclovir, mupirocin ointment, amLODipine, glimepiride, losartan, lovastatin, pioglitazone, and ibuprofen.  No orders of the defined types were placed in this encounter.    Follow-up: No Follow-up on file.  Walker Kehr, MD

## 2017-06-22 NOTE — Assessment & Plan Note (Signed)
On Amlodipine and Losartan Nl BP at home

## 2017-06-22 NOTE — Assessment & Plan Note (Signed)
Discussed.

## 2017-06-22 NOTE — Assessment & Plan Note (Signed)
Lovastatin 

## 2017-07-12 ENCOUNTER — Other Ambulatory Visit: Payer: Self-pay | Admitting: Internal Medicine

## 2017-07-19 ENCOUNTER — Encounter: Payer: Self-pay | Admitting: Physician Assistant

## 2017-07-19 DIAGNOSIS — L821 Other seborrheic keratosis: Secondary | ICD-10-CM | POA: Diagnosis not present

## 2017-07-19 DIAGNOSIS — D225 Melanocytic nevi of trunk: Secondary | ICD-10-CM | POA: Diagnosis not present

## 2017-07-19 DIAGNOSIS — L988 Other specified disorders of the skin and subcutaneous tissue: Secondary | ICD-10-CM | POA: Diagnosis not present

## 2017-08-09 ENCOUNTER — Other Ambulatory Visit: Payer: Self-pay | Admitting: Internal Medicine

## 2017-08-23 DIAGNOSIS — H524 Presbyopia: Secondary | ICD-10-CM | POA: Diagnosis not present

## 2017-08-23 DIAGNOSIS — E119 Type 2 diabetes mellitus without complications: Secondary | ICD-10-CM | POA: Diagnosis not present

## 2017-08-23 DIAGNOSIS — H25013 Cortical age-related cataract, bilateral: Secondary | ICD-10-CM | POA: Diagnosis not present

## 2017-08-23 LAB — HM DIABETES EYE EXAM

## 2017-09-21 ENCOUNTER — Encounter: Payer: Self-pay | Admitting: Internal Medicine

## 2017-10-18 ENCOUNTER — Other Ambulatory Visit (INDEPENDENT_AMBULATORY_CARE_PROVIDER_SITE_OTHER): Payer: Medicare HMO

## 2017-10-18 DIAGNOSIS — I10 Essential (primary) hypertension: Secondary | ICD-10-CM | POA: Diagnosis not present

## 2017-10-18 DIAGNOSIS — M109 Gout, unspecified: Secondary | ICD-10-CM

## 2017-10-18 DIAGNOSIS — E119 Type 2 diabetes mellitus without complications: Secondary | ICD-10-CM | POA: Diagnosis not present

## 2017-10-18 DIAGNOSIS — R209 Unspecified disturbances of skin sensation: Secondary | ICD-10-CM | POA: Diagnosis not present

## 2017-10-18 DIAGNOSIS — E785 Hyperlipidemia, unspecified: Secondary | ICD-10-CM | POA: Diagnosis not present

## 2017-10-18 LAB — HEPATIC FUNCTION PANEL
ALT: 17 U/L (ref 0–53)
AST: 17 U/L (ref 0–37)
Albumin: 4.1 g/dL (ref 3.5–5.2)
Alkaline Phosphatase: 67 U/L (ref 39–117)
BILIRUBIN DIRECT: 0.1 mg/dL (ref 0.0–0.3)
BILIRUBIN TOTAL: 0.6 mg/dL (ref 0.2–1.2)
Total Protein: 6.7 g/dL (ref 6.0–8.3)

## 2017-10-18 LAB — BASIC METABOLIC PANEL
BUN: 14 mg/dL (ref 6–23)
CHLORIDE: 107 meq/L (ref 96–112)
CO2: 27 meq/L (ref 19–32)
Calcium: 9.2 mg/dL (ref 8.4–10.5)
Creatinine, Ser: 0.98 mg/dL (ref 0.40–1.50)
GFR: 80.43 mL/min (ref >60.00)
Glucose, Bld: 120 mg/dL — ABNORMAL HIGH (ref 70–99)
Potassium: 4.3 mEq/L (ref 3.5–5.1)
SODIUM: 142 meq/L (ref 135–145)

## 2017-10-18 LAB — LIPID PANEL
CHOL/HDL RATIO: 3
Cholesterol: 120 mg/dL (ref 0–200)
HDL: 45.7 mg/dL (ref >39.00)
LDL CALC: 64 mg/dL (ref 0–99)
NonHDL: 74.62
TRIGLYCERIDES: 52 mg/dL (ref 0.0–149.0)
VLDL: 10.4 mg/dL (ref 0.0–40.0)

## 2017-10-18 LAB — HEMOGLOBIN A1C: HEMOGLOBIN A1C: 6.8 % — AB (ref 4.6–6.5)

## 2017-10-26 ENCOUNTER — Encounter: Payer: Self-pay | Admitting: Internal Medicine

## 2017-10-26 ENCOUNTER — Ambulatory Visit (INDEPENDENT_AMBULATORY_CARE_PROVIDER_SITE_OTHER): Payer: Medicare HMO | Admitting: Internal Medicine

## 2017-10-26 DIAGNOSIS — C61 Malignant neoplasm of prostate: Secondary | ICD-10-CM

## 2017-10-26 DIAGNOSIS — M1000 Idiopathic gout, unspecified site: Secondary | ICD-10-CM

## 2017-10-26 DIAGNOSIS — E119 Type 2 diabetes mellitus without complications: Secondary | ICD-10-CM | POA: Diagnosis not present

## 2017-10-26 MED ORDER — PIOGLITAZONE HCL 45 MG PO TABS: 45.0000 mg | ORAL_TABLET | Freq: Every day | ORAL | 3 refills | Status: DC

## 2017-10-26 MED ORDER — AMLODIPINE BESYLATE 5 MG PO TABS: ORAL_TABLET | ORAL | 3 refills | Status: DC

## 2017-10-26 MED ORDER — GLIMEPIRIDE 2 MG PO TABS: 4.0000 mg | ORAL_TABLET | Freq: Every day | ORAL | 3 refills | Status: DC

## 2017-10-26 MED ORDER — LOVASTATIN 20 MG PO TABS: 20.0000 mg | ORAL_TABLET | Freq: Every day | ORAL | 3 refills | Status: DC

## 2017-10-26 NOTE — Assessment & Plan Note (Signed)
S/p XRT seeds

## 2017-10-26 NOTE — Progress Notes (Signed)
Subjective:  Patient ID: Jacob Chapman, male    DOB: [birth year]  Age: 72 y.o. MRN: [id number]  CC: No chief complaint on file.   HPI Jacob Chapman presents for DM, HTN, prostate ca f/u  Outpatient Medications Prior to Visit  Medication Sig Dispense Refill  . acyclovir (ZOVIRAX) 400 MG tablet TAKE 1 TABLET  3 TIMES DAILY FOR COLD SORES 21 tablet 3  . aspirin 81 MG EC tablet Take 81 mg by mouth daily.     . cholecalciferol (VITAMIN D) 1000 UNITS tablet daily.    . Cyanocobalamin (VITAMIN B12 PO) daily.    Marland Kitchen ibuprofen (ADVIL,MOTRIN) 800 MG tablet Take 1 tablet (800 mg total) by mouth 3 (three) times daily as needed. 90 tablet 1  . losartan (COZAAR) 100 MG tablet Take 1 tablet (100 mg total) by mouth daily. 90 tablet 3  . metaxalone (SKELAXIN) 800 MG tablet Take 1 tablet (800 mg total) by mouth 3 (three) times daily as needed. 100 tablet 1  . mupirocin ointment (BACTROBAN) 2 % Apply 1 application topically 2 (two) times daily. 22 g 1  . vitamin B-12 (CYANOCOBALAMIN) 500 MCG tablet Take 500 mcg by mouth daily.     Marland Kitchen amLODipine (NORVASC) 5 MG tablet TAKE 1 AND 1/2 TABLETS EVERY DAY 135 tablet 3  . glimepiride (AMARYL) 2 MG tablet Take 2 tablets (4 mg total) by mouth daily before breakfast. 180 tablet 3  . lovastatin (MEVACOR) 20 MG tablet Take 1 tablet (20 mg total) by mouth at bedtime. 90 tablet 3  . pioglitazone (ACTOS) 45 MG tablet Take 1 tablet (45 mg total) by mouth daily. 90 tablet 3   No facility-administered medications prior to visit.     ROS: Review of Systems  Constitutional: Negative for appetite change, fatigue and unexpected weight change.  HENT: Negative for congestion, nosebleeds, sneezing, sore throat and trouble swallowing.   Eyes: Negative for itching and visual disturbance.  Respiratory: Negative for cough.   Cardiovascular: Negative for chest pain, palpitations and leg swelling.  Gastrointestinal: Negative for abdominal distention, blood in stool, diarrhea and  nausea.  Genitourinary: Negative for frequency and hematuria.  Musculoskeletal: Negative for back pain, gait problem, joint swelling and neck pain.  Skin: Negative for rash.  Neurological: Negative for dizziness, tremors, speech difficulty and weakness.  Psychiatric/Behavioral: Negative for agitation, dysphoric mood and sleep disturbance. The patient is not nervous/anxious.     Objective:  BP 118/80 (BP Location: Left Arm, Patient Position: Sitting, Cuff Size: Normal)   Pulse 61   Temp 98.2 F (36.8 C) (Oral)   Ht 5' 6.5" (1.689 m)   Wt 164 lb (74.4 kg)   SpO2 98%   BMI 26.07 kg/m   BP Readings from Last 3 Encounters:  10/26/17 118/80  06/22/17 112/72  02/17/17 114/72    Wt Readings from Last 3 Encounters:  10/26/17 164 lb (74.4 kg)  06/22/17 164 lb (74.4 kg)  02/17/17 166 lb (75.3 kg)    Physical Exam  Constitutional: He is oriented to person, place, and time. He appears well-developed. No distress.  NAD  HENT:  Mouth/Throat: Oropharynx is clear and moist.  Eyes: Pupils are equal, round, and reactive to light. Conjunctivae are normal.  Neck: Normal range of motion. No JVD present. No thyromegaly present.  Cardiovascular: Normal rate, regular rhythm, normal heart sounds and intact distal pulses. Exam reveals no gallop and no friction rub.  No murmur heard. Pulmonary/Chest: Effort normal and breath sounds normal. No respiratory  distress. He has no wheezes. He has no rales. He exhibits no tenderness.  Abdominal: Soft. Bowel sounds are normal. He exhibits no distension and no mass. There is no tenderness. There is no rebound and no guarding.  Musculoskeletal: Normal range of motion. He exhibits no edema or tenderness.  Lymphadenopathy:    He has no cervical adenopathy.  Neurological: He is alert and oriented to person, place, and time. He has normal reflexes. No cranial nerve deficit. He exhibits normal muscle tone. He displays a negative Romberg sign. Coordination and gait  normal.  Skin: Skin is warm and dry. No rash noted.  Psychiatric: He has a normal mood and affect. His behavior is normal. Judgment and thought content normal.    Lab Results  Component Value Date   WBC 8.3 09/20/2014   HGB 14.4 09/20/2014   HCT 43.4 (A) 09/20/2014   PLT 180 10/30/2011   GLUCOSE 120 (H) 10/18/2017   CHOL 120 10/18/2017   TRIG 52.0 10/18/2017   HDL 45.70 10/18/2017   LDLCALC 64 10/18/2017   ALT 17 10/18/2017   AST 17 10/18/2017   NA 142 10/18/2017   K 4.3 10/18/2017   CL 107 10/18/2017   CREATININE 0.98 10/18/2017   BUN 14 10/18/2017   CO2 27 10/18/2017   TSH 2.115 09/20/2014   PSA 0.3 01/30/2016   INR CANCELED 01/18/2014   HGBA1C 6.8 (H) 10/18/2017    US Pelvis Limited  Result Date: 01/30/2016 CLINICAL DATA:  Left groin pain for 3 days after heavy lifting, assessment for hernia. EXAM: LIMITED ULTRASOUND OF PELVIS TECHNIQUE: Limited transabdominal ultrasound examination of the pelvis was performed. COMPARISON:  11/08/2009 CT pelvis FINDINGS: Assessment of the left groin/ inguinal region demonstrates no hernia. Comparison images of the right-side appear symmetric. IMPRESSION: 1. No groin hernia is demonstrated on the left side. Electronically Signed   By: Van Clines M.D.   On: 01/30/2016 16:21    Assessment & Plan:   There are no diagnoses linked to this encounter.   Meds ordered this encounter  Medications  . amLODipine (NORVASC) 5 MG tablet    Sig: TAKE 1 AND 1/2 TABLETS EVERY DAY    Dispense:  135 tablet    Refill:  3  . glimepiride (AMARYL) 2 MG tablet    Sig: Take 2 tablets (4 mg total) by mouth daily before breakfast.    Dispense:  180 tablet    Refill:  3  . lovastatin (MEVACOR) 20 MG tablet    Sig: Take 1 tablet (20 mg total) by mouth at bedtime.    Dispense:  90 tablet    Refill:  3  . pioglitazone (ACTOS) 45 MG tablet    Sig: Take 1 tablet (45 mg total) by mouth daily.    Dispense:  90 tablet    Refill:  3     Follow-up:  No follow-ups on file.  Walker Kehr, MD

## 2017-10-26 NOTE — Assessment & Plan Note (Signed)
Actos, Amaryl 

## 2017-10-26 NOTE — Assessment & Plan Note (Signed)
No relapse 

## 2018-03-01 ENCOUNTER — Other Ambulatory Visit (INDEPENDENT_AMBULATORY_CARE_PROVIDER_SITE_OTHER): Payer: Medicare HMO

## 2018-03-01 DIAGNOSIS — I10 Essential (primary) hypertension: Secondary | ICD-10-CM

## 2018-03-01 DIAGNOSIS — R209 Unspecified disturbances of skin sensation: Secondary | ICD-10-CM

## 2018-03-01 DIAGNOSIS — M109 Gout, unspecified: Secondary | ICD-10-CM | POA: Diagnosis not present

## 2018-03-01 DIAGNOSIS — E785 Hyperlipidemia, unspecified: Secondary | ICD-10-CM

## 2018-03-01 DIAGNOSIS — E119 Type 2 diabetes mellitus without complications: Secondary | ICD-10-CM

## 2018-03-01 LAB — HEPATIC FUNCTION PANEL
ALBUMIN: 4 g/dL (ref 3.5–5.2)
ALK PHOS: 71 U/L (ref 39–117)
ALT: 18 U/L (ref 0–53)
AST: 20 U/L (ref 0–37)
Bilirubin, Direct: 0.1 mg/dL (ref 0.0–0.3)
TOTAL PROTEIN: 6.7 g/dL (ref 6.0–8.3)
Total Bilirubin: 0.5 mg/dL (ref 0.2–1.2)

## 2018-03-01 LAB — BASIC METABOLIC PANEL
BUN: 15 mg/dL (ref 6–23)
CHLORIDE: 107 meq/L (ref 96–112)
CO2: 27 mEq/L (ref 19–32)
CREATININE: 0.98 mg/dL (ref 0.40–1.50)
Calcium: 9.2 mg/dL (ref 8.4–10.5)
GFR: 80.34 mL/min (ref >60.00)
GLUCOSE: 103 mg/dL — AB (ref 70–99)
Potassium: 4.2 mEq/L (ref 3.5–5.1)
Sodium: 142 mEq/L (ref 135–145)

## 2018-03-01 LAB — LIPID PANEL
CHOLESTEROL: 112 mg/dL (ref 0–200)
HDL: 47.1 mg/dL (ref >39.00)
LDL Cholesterol: 57 mg/dL (ref 0–99)
NONHDL: 64.99
Total CHOL/HDL Ratio: 2
Triglycerides: 41 mg/dL (ref 0.0–149.0)
VLDL: 8.2 mg/dL (ref 0.0–40.0)

## 2018-03-01 LAB — HEMOGLOBIN A1C: HEMOGLOBIN A1C: 6.5 % (ref 4.6–6.5)

## 2018-03-02 ENCOUNTER — Other Ambulatory Visit: Payer: Self-pay | Admitting: Internal Medicine

## 2018-03-08 ENCOUNTER — Ambulatory Visit (INDEPENDENT_AMBULATORY_CARE_PROVIDER_SITE_OTHER): Payer: Medicare HMO | Admitting: Internal Medicine

## 2018-03-08 ENCOUNTER — Telehealth: Payer: Self-pay | Admitting: Internal Medicine

## 2018-03-08 ENCOUNTER — Encounter: Payer: Self-pay | Admitting: Internal Medicine

## 2018-03-08 VITALS — BP 122/78 | HR 57 | Temp 98.0°F | Ht 66.5 in | Wt 161.0 lb

## 2018-03-08 DIAGNOSIS — C61 Malignant neoplasm of prostate: Secondary | ICD-10-CM | POA: Diagnosis not present

## 2018-03-08 DIAGNOSIS — Z23 Encounter for immunization: Secondary | ICD-10-CM | POA: Diagnosis not present

## 2018-03-08 DIAGNOSIS — E119 Type 2 diabetes mellitus without complications: Secondary | ICD-10-CM | POA: Diagnosis not present

## 2018-03-08 DIAGNOSIS — E785 Hyperlipidemia, unspecified: Secondary | ICD-10-CM

## 2018-03-08 NOTE — Assessment & Plan Note (Signed)
F/u w/IUrol

## 2018-03-08 NOTE — Telephone Encounter (Signed)
Pt would like to have the Cardiac CT calcium scoring test $150 Ordered

## 2018-03-08 NOTE — Assessment & Plan Note (Signed)
On Lovastatin CT ca score offered

## 2018-03-08 NOTE — Progress Notes (Signed)
Established Patient Office Visit  Subjective:  Patient ID: Jacob Chapman, male    DOB: [birth year]  Age: 72 y.o. MRN: [id number]  CC: No chief complaint on file.   HPI Jacob Chapman presents for DM, HTN, dyslipidemia f/u  Past Medical History:  Diagnosis Date  . Bulging lumbar disc    no current prob  . Chronic lower back pain    Hx - no current prob  . Diabetes mellitus, type 2 ([place])    type II  . History of gout stable -- last bout 3 yrs ago   no current prob  . HTN (hypertension)   . Hyperlipidemia   . Prostate cancer ([place]) 09/01/11   dx Adenocarcinoma    Past Surgical History:  Procedure Laterality Date  . COLONOSCOPY  2005, 2017  . CYSTOSCOPY  11/06/2011   Procedure: CYSTOSCOPY FLEXIBLE;  Surgeon: Claybon Jabs, MD;  Location: [facility];  Service: Urology;  Laterality: N/A;  no seeds found in bladder  . PROSTATE BIOPSY  09/01/2011   DR OTTELIN'S OFFICE   gleason 3+3=6,volume=21cc,PSA=6.73  . RADIOACTIVE SEED IMPLANT  11/06/2011   Procedure: RADIOACTIVE SEED IMPLANT;  Surgeon: Claybon Jabs, MD;  Location: [facility];  Service: Urology;  Laterality: Bilateral;  85 seeds implanted  . VASECTOMY      Family History  Adopted: Yes  Problem Relation Age of Onset  . Hypertension Other 65       maternal 1st cousin,leukemia deceased  . Stroke Mother        36  . Cancer Father 16       lung ca  . Heart disease Sister 45       rhematic fever  . Colon cancer Neg Hx   . Colon polyps Neg Hx   . Esophageal cancer Neg Hx   . Rectal cancer Neg Hx   . Stomach cancer Neg Hx     Social History   Socioeconomic History  . Marital status: Widowed    Spouse name: Not on file  . Number of children: Not on file  . Years of education: Not on file  . Highest education level: Not on file  Occupational History  . Occupation: (WORKS DAYS)    Employer: [employer]    Comment: assembler for [employer]  . Financial resource strain:  Not on file  . Food insecurity:    Worry: Not on file    Inability: Not on file  . Transportation needs:    Medical: Not on file    Non-medical: Not on file  Tobacco Use  . Smoking status: Never Smoker  . Smokeless tobacco: Never Used  Substance and Sexual Activity  . Alcohol use: No    Alcohol/week: 0.0 standard drinks  . Drug use: No  . Sexual activity: Yes  Lifestyle  . Physical activity:    Days per week: Not on file    Minutes per session: Not on file  . Stress: Not on file  Relationships  . Social connections:    Talks on phone: Not on file    Gets together: Not on file    Attends religious service: Not on file    Active member of club or organization: Not on file    Attends meetings of clubs or organizations: Not on file    Relationship status: Not on file  . Intimate partner violence:    Fear of current or ex partner: Not on file  Emotionally abused: Not on file    Physically abused: Not on file    Forced sexual activity: Not on file  Other Topics Concern  . Not on file  Social History Narrative   Regular exercise - YES          Outpatient Medications Prior to Visit  Medication Sig Dispense Refill  . acyclovir (ZOVIRAX) 400 MG tablet TAKE 1 TABLET  3 TIMES DAILY FOR COLD SORES 21 tablet 3  . amLODipine (NORVASC) 5 MG tablet TAKE 1 AND 1/2 TABLETS EVERY DAY 135 tablet 3  . aspirin 81 MG EC tablet Take 81 mg by mouth daily.     . cholecalciferol (VITAMIN D) 1000 UNITS tablet daily.    . Cyanocobalamin (VITAMIN B12 PO) daily.    Marland Kitchen glimepiride (AMARYL) 2 MG tablet Take 2 tablets (4 mg total) by mouth daily before breakfast. 180 tablet 3  . ibuprofen (ADVIL,MOTRIN) 800 MG tablet Take 1 tablet (800 mg total) by mouth 3 (three) times daily as needed. 90 tablet 1  . losartan (COZAAR) 100 MG tablet TAKE 1 TABLET EVERY DAY 90 tablet 0  . lovastatin (MEVACOR) 20 MG tablet Take 1 tablet (20 mg total) by mouth at bedtime. 90 tablet 3  . metaxalone (SKELAXIN) 800 MG  tablet Take 1 tablet (800 mg total) by mouth 3 (three) times daily as needed. 100 tablet 1  . mupirocin ointment (BACTROBAN) 2 % Apply 1 application topically 2 (two) times daily. 22 g 1  . pioglitazone (ACTOS) 45 MG tablet Take 1 tablet (45 mg total) by mouth daily. 90 tablet 3  . vitamin B-12 (CYANOCOBALAMIN) 500 MCG tablet Take 500 mcg by mouth daily.      No facility-administered medications prior to visit.     Allergies  Allergen Reactions  . Metformin Nausea Only    ROS Review of Systems  Constitutional: Negative for appetite change, fatigue and unexpected weight change.  HENT: Negative for congestion, nosebleeds, sneezing, sore throat and trouble swallowing.   Eyes: Negative for itching and visual disturbance.  Respiratory: Negative for cough.   Cardiovascular: Negative for chest pain, palpitations and leg swelling.  Gastrointestinal: Negative for abdominal distention, blood in stool, diarrhea and nausea.  Genitourinary: Negative for frequency and hematuria.  Musculoskeletal: Positive for arthralgias. Negative for back pain, gait problem, joint swelling and neck pain.  Skin: Negative for rash.  Neurological: Negative for dizziness, tremors, speech difficulty and weakness.  Psychiatric/Behavioral: Negative for agitation, dysphoric mood, sleep disturbance and suicidal ideas. The patient is not nervous/anxious.       Objective:    Physical Exam  Constitutional: He is oriented to person, place, and time. He appears well-developed. No distress.  NAD  HENT:  Mouth/Throat: Oropharynx is clear and moist.  Eyes: Pupils are equal, round, and reactive to light. Conjunctivae are normal.  Neck: Normal range of motion. No JVD present. No thyromegaly present.  Cardiovascular: Normal rate, regular rhythm, normal heart sounds and intact distal pulses. Exam reveals no gallop and no friction rub.  No murmur heard. Pulmonary/Chest: Effort normal and breath sounds normal. No respiratory  distress. He has no wheezes. He has no rales. He exhibits no tenderness.  Abdominal: Soft. Bowel sounds are normal. He exhibits no distension and no mass. There is no tenderness. There is no rebound and no guarding.  Musculoskeletal: Normal range of motion. He exhibits no edema or tenderness.  Lymphadenopathy:    He has no cervical adenopathy.  Neurological: He is alert and oriented  to person, place, and time. He has normal reflexes. No cranial nerve deficit. He exhibits normal muscle tone. He displays a negative Romberg sign. Coordination and gait normal.  Skin: Skin is warm and dry. No rash noted.  Psychiatric: He has a normal mood and affect. His behavior is normal. Judgment and thought content normal.    BP 122/78 (BP Location: Left Arm, Patient Position: Sitting, Cuff Size: Normal)   Pulse (!) 57   Temp 98 F (36.7 C) (Oral)   Ht 5' 6.5" (1.689 m)   Wt 161 lb (73 kg)   SpO2 99%   BMI 25.60 kg/m  Wt Readings from Last 3 Encounters:  03/08/18 161 lb (73 kg)  10/26/17 164 lb (74.4 kg)  06/22/17 164 lb (74.4 kg)     Health Maintenance Due  Topic Date Due  . Hepatitis C Screening  April 16, 1946  . FOOT EXAM  10/07/2016  . INFLUENZA VACCINE  11/18/2017    There are no preventive care reminders to display for this patient.  Lab Results  Component Value Date   TSH 2.115 09/20/2014   Lab Results  Component Value Date   WBC 8.3 09/20/2014   HGB 14.4 09/20/2014   HCT 43.4 (A) 09/20/2014   MCV 92.2 09/20/2014   PLT 180 10/30/2011   Lab Results  Component Value Date   NA 142 03/01/2018   K 4.2 03/01/2018   CO2 27 03/01/2018   GLUCOSE 103 (H) 03/01/2018   BUN 15 03/01/2018   CREATININE 0.98 03/01/2018   BILITOT 0.5 03/01/2018   ALKPHOS 71 03/01/2018   AST 20 03/01/2018   ALT 18 03/01/2018   PROT 6.7 03/01/2018   ALBUMIN 4.0 03/01/2018   CALCIUM 9.2 03/01/2018   GFR 80.34 03/01/2018   Lab Results  Component Value Date   CHOL 112 03/01/2018   Lab Results    Component Value Date   HDL 47.10 03/01/2018   Lab Results  Component Value Date   LDLCALC 57 03/01/2018   Lab Results  Component Value Date   TRIG 41.0 03/01/2018   Lab Results  Component Value Date   CHOLHDL 2 03/01/2018   Lab Results  Component Value Date   HGBA1C 6.5 03/01/2018      Assessment & Plan:   Problem List Items Addressed This Visit    None      No orders of the defined types were placed in this encounter.   Follow-up: No follow-ups on file.    Walker Kehr, MD

## 2018-03-08 NOTE — Assessment & Plan Note (Signed)
On Lovastatin, Actos, Amaryl CT ca score offered

## 2018-03-08 NOTE — Addendum Note (Signed)
Addended by: Karren Cobble on: 03/08/2018 11:54 AM   Modules accepted: Orders

## 2018-03-08 NOTE — Patient Instructions (Signed)

## 2018-03-10 NOTE — Telephone Encounter (Signed)
Plotnikov ordered at last OV

## 2018-03-22 DIAGNOSIS — C61 Malignant neoplasm of prostate: Secondary | ICD-10-CM | POA: Diagnosis not present

## 2018-03-29 DIAGNOSIS — N400 Enlarged prostate without lower urinary tract symptoms: Secondary | ICD-10-CM | POA: Diagnosis not present

## 2018-03-29 DIAGNOSIS — Z8546 Personal history of malignant neoplasm of prostate: Secondary | ICD-10-CM | POA: Diagnosis not present

## 2018-04-05 ENCOUNTER — Ambulatory Visit (INDEPENDENT_AMBULATORY_CARE_PROVIDER_SITE_OTHER)
Admission: RE | Admit: 2018-04-05 | Discharge: 2018-04-05 | Disposition: A | Discharge status: Home / Self Care | Payer: Self-pay | Source: Ambulatory Visit | Attending: Internal Medicine | Admitting: Internal Medicine

## 2018-04-05 DIAGNOSIS — E119 Type 2 diabetes mellitus without complications: Secondary | ICD-10-CM

## 2018-04-05 DIAGNOSIS — E785 Hyperlipidemia, unspecified: Secondary | ICD-10-CM

## 2018-05-05 ENCOUNTER — Telehealth: Payer: Self-pay

## 2018-05-05 NOTE — Telephone Encounter (Signed)
PA started on CoverMyMeds KEY: [id number]

## 2018-05-06 NOTE — Telephone Encounter (Signed)
Coverage Starts on: 05/05/2018 12:00:00 AM, Coverage Ends on: 04/20/2019

## 2018-05-09 ENCOUNTER — Other Ambulatory Visit: Payer: Self-pay | Admitting: Internal Medicine

## 2018-06-07 ENCOUNTER — Ambulatory Visit
Admission: EM | Admit: 2018-06-07 | Discharge: 2018-06-07 | Disposition: A | Discharge status: Home / Self Care | Payer: Medicare HMO | Attending: Family Medicine | Admitting: Family Medicine

## 2018-06-07 ENCOUNTER — Encounter: Payer: Self-pay | Admitting: Family Medicine

## 2018-06-07 DIAGNOSIS — S76912A Strain of unspecified muscles, fascia and tendons at thigh level, left thigh, initial encounter: Secondary | ICD-10-CM

## 2018-06-07 MED ORDER — DICLOFENAC SODIUM 75 MG PO TBEC: 75.0000 mg | DELAYED_RELEASE_TABLET | Freq: Two times a day (BID) | ORAL | 0 refills | Status: DC

## 2018-06-07 NOTE — ED Provider Notes (Signed)
[facility]    CSN: [id number] Arrival date & time: 06/07/18  1334     History   Chief Complaint Chief Complaint  Patient presents with  . Leg Pain    HPI Jacob Chapman is a 73 y.o. male.   73 year old man presents to the [facility] in urgent care for the first time, today complaining about left leg pain.  He has a history of lumbar disc disease as well as gout and diabetes mellitus, type II.  Patient also has a history of prostate cancer, hyperlipidemia, and hypertension.     Patient also has a history of prostate cancer, hypertension and hyperlipidemia.  Past Medical History:  Diagnosis Date  . Bulging lumbar disc    no current prob  . Chronic lower back pain    Hx - no current prob  . Diabetes mellitus, type 2 ([place])    type II  . History of gout stable -- last bout 3 yrs ago   no current prob  . HTN (hypertension)   . Hyperlipidemia   . Prostate cancer ([place]) 09/01/11   dx Adenocarcinoma    Patient Active Problem List   Diagnosis Date Noted  . Grief 02/26/2016  . Plantar fasciitis of right foot 02/26/2014  . Abscess or cellulitis, neck 11/09/2011  . Mass of right side of neck 11/09/2011  . Prostate cancer ([place]) 09/01/2011  . Lipoma of neck 09/12/2010  . Pain in limb 09/03/2009  . CHEST PAIN, UNSPECIFIED 05/02/2008  . OSTEOARTHRITIS 09/09/2007  . LOW BACK PAIN 09/09/2007  . Dyslipidemia 05/02/2007  . Gout 05/02/2007  . Essential hypertension 05/02/2007  . PARESTHESIA 05/02/2007  . DM2 (diabetes mellitus, type 2) ([place]) 04/25/2007    Past Surgical History:  Procedure Laterality Date  . COLONOSCOPY  2005, 2017  . CYSTOSCOPY  11/06/2011   Procedure: CYSTOSCOPY FLEXIBLE;  Surgeon: Claybon Jabs, MD;  Location: [facility];  Service: Urology;  Laterality: N/A;  no seeds found in bladder  . PROSTATE BIOPSY  09/01/2011   DR OTTELIN'S OFFICE   gleason 3+3=6,volume=21cc,PSA=6.73  . RADIOACTIVE SEED IMPLANT  11/06/2011   Procedure:  RADIOACTIVE SEED IMPLANT;  Surgeon: Claybon Jabs, MD;  Location: [facility];  Service: Urology;  Laterality: Bilateral;  85 seeds implanted  . VASECTOMY         Home Medications    Prior to Admission medications   Medication Sig Start Date End Date Taking? Authorizing Provider  acyclovir (ZOVIRAX) 400 MG tablet TAKE 1 TABLET  3 TIMES DAILY FOR COLD SORES 08/11/17   Plotnikov, Evie Lacks, MD  amLODipine (NORVASC) 5 MG tablet TAKE 1 AND 1/2 TABLETS EVERY DAY 10/26/17   Plotnikov, Evie Lacks, MD  aspirin 81 MG EC tablet Take 81 mg by mouth daily.     [provider]  cholecalciferol (VITAMIN D) 1000 UNITS tablet daily. 07/20/14   [provider]  Cyanocobalamin (VITAMIN B12 PO) daily. 07/20/14   [provider]  diclofenac (VOLTAREN) 75 MG EC tablet Take 1 tablet (75 mg total) by mouth 2 (two) times daily. 06/07/18   Robyn Haber, MD  glimepiride (AMARYL) 2 MG tablet Take 2 tablets (4 mg total) by mouth daily before breakfast. 10/26/17   Plotnikov, Evie Lacks, MD  losartan (COZAAR) 100 MG tablet TAKE 1 TABLET EVERY DAY 05/11/18   Plotnikov, Evie Lacks, MD  lovastatin (MEVACOR) 20 MG tablet Take 1 tablet (20 mg total) by mouth at bedtime. 10/26/17   Plotnikov, Tyrone Apple  V, MD  metaxalone (SKELAXIN) 800 MG tablet Take 1 tablet (800 mg total) by mouth 3 (three) times daily as needed. 06/26/14   Plotnikov, Evie Lacks, MD  mupirocin ointment (BACTROBAN) 2 % Apply 1 application topically 2 (two) times daily. 07/25/16   Ivar Drape D, PA  pioglitazone (ACTOS) 45 MG tablet Take 1 tablet (45 mg total) by mouth daily. 10/26/17   Plotnikov, Evie Lacks, MD  vitamin B-12 (CYANOCOBALAMIN) 500 MCG tablet Take 500 mcg by mouth daily.     [provider]    Family History Family History  Adopted: Yes  Problem Relation Age of Onset  . Stroke Mother        27  . Cancer Father 16       lung ca  . Heart disease Sister 69       rhematic fever  . Hypertension Other 65         maternal 1st cousin,leukemia deceased  . Colon cancer Neg Hx   . Colon polyps Neg Hx   . Esophageal cancer Neg Hx   . Rectal cancer Neg Hx   . Stomach cancer Neg Hx     Social History Social History   Tobacco Use  . Smoking status: Never Smoker  . Smokeless tobacco: Never Used  Substance Use Topics  . Alcohol use: No    Alcohol/week: 0.0 standard drinks  . Drug use: No     Allergies   Metformin   Review of Systems Review of Systems   Physical Exam Triage Vital Signs ED Triage Vitals  Enc Vitals Group     BP      Pulse      Resp      Temp      Temp src      SpO2      Weight      Height      Head Circumference      Peak Flow      Pain Score      Pain Loc      Pain Edu?      Excl. in [place]?    No data found.  Updated Vital Signs BP (!) 148/87   Pulse 64   Temp 97.8 F (36.6 C) (Oral)   Resp 18   SpO2 98%    Physical Exam Vitals signs and nursing note reviewed.  Constitutional:      Appearance: Normal appearance. He is normal weight.  Neck:     Musculoskeletal: Normal range of motion and neck supple.  Pulmonary:     Effort: Pulmonary effort is normal.  Musculoskeletal: Normal range of motion.        General: Tenderness present. No swelling, deformity or signs of injury.     Comments: SLR unremarkable No calf tenderness Tender at distal aspect of left leg vastus lateralis  Skin:    General: Skin is warm and dry.     Findings: No erythema.  Neurological:     General: No focal deficit present.     Mental Status: He is alert.  Psychiatric:        Mood and Affect: Mood normal.      UC Treatments / Results  Labs (all labs ordered are listed, but only abnormal results are displayed) Labs Reviewed - No data to display  EKG None  Radiology No results found.  Procedures Procedures (including critical care time)  Medications Ordered in UC Medications - No data to display  Initial Impression /  Assessment and Plan / UC Course  I  have reviewed the triage vital signs and the nursing notes.  Pertinent labs & imaging results that were available during my care of the patient were reviewed by me and considered in my medical decision making (see chart for details).    Final Clinical Impressions(s) / UC Diagnoses   Final diagnoses:  Muscle strain of left thigh, initial encounter   Discharge Instructions   None    ED Prescriptions    Medication Sig Dispense Auth. Provider   diclofenac (VOLTAREN) 75 MG EC tablet Take 1 tablet (75 mg total) by mouth 2 (two) times daily. 14 tablet Robyn Haber, MD     Controlled Substance Prescriptions [place] Controlled Substance Registry consulted? Not Applicable   Robyn Haber, MD 06/07/18 1400

## 2018-06-07 NOTE — ED Triage Notes (Signed)
Pt c/o lt upper leg pain x1wk; denies injury; states worse when sitting and laying

## 2018-06-08 ENCOUNTER — Ambulatory Visit: Payer: Medicare HMO | Admitting: Emergency Medicine

## 2018-07-05 ENCOUNTER — Other Ambulatory Visit (INDEPENDENT_AMBULATORY_CARE_PROVIDER_SITE_OTHER): Payer: Medicare HMO

## 2018-07-05 DIAGNOSIS — E119 Type 2 diabetes mellitus without complications: Secondary | ICD-10-CM | POA: Diagnosis not present

## 2018-07-05 DIAGNOSIS — M109 Gout, unspecified: Secondary | ICD-10-CM | POA: Diagnosis not present

## 2018-07-05 DIAGNOSIS — I10 Essential (primary) hypertension: Secondary | ICD-10-CM | POA: Diagnosis not present

## 2018-07-05 LAB — BASIC METABOLIC PANEL
BUN: 18 mg/dL (ref 6–23)
CO2: 27 mEq/L (ref 19–32)
Calcium: 8.9 mg/dL (ref 8.4–10.5)
Chloride: 107 mEq/L (ref 96–112)
Creatinine, Ser: 0.95 mg/dL (ref 0.40–1.50)
GFR: 78.28 mL/min (ref >60.00)
GLUCOSE: 114 mg/dL — AB (ref 70–99)
Potassium: 4.3 mEq/L (ref 3.5–5.1)
Sodium: 140 mEq/L (ref 135–145)

## 2018-07-05 LAB — HEMOGLOBIN A1C: Hgb A1c MFr Bld: 6.8 % — ABNORMAL HIGH (ref 4.6–6.5)

## 2018-07-11 ENCOUNTER — Other Ambulatory Visit: Payer: Self-pay | Admitting: Internal Medicine

## 2018-07-11 ENCOUNTER — Telehealth: Payer: Self-pay | Admitting: Internal Medicine

## 2018-07-11 NOTE — Telephone Encounter (Signed)
Copied from [place] [id number]. Topic: General - Other >> Jul 11, 2018 11:27 AM Lennox Solders wrote: Reason for FPU:[facility] pharm is calling and needs new rxs diclofenac 75 mg, accu chek viva plus meter, accu chek soft click lancets, bd single use alcohol swabs and accu check solution. [employer] phone number [phone] or fax number [phone]

## 2018-07-12 ENCOUNTER — Ambulatory Visit (INDEPENDENT_AMBULATORY_CARE_PROVIDER_SITE_OTHER): Payer: Medicare HMO | Admitting: Internal Medicine

## 2018-07-12 ENCOUNTER — Other Ambulatory Visit: Payer: Self-pay

## 2018-07-12 ENCOUNTER — Encounter: Payer: Self-pay | Admitting: Internal Medicine

## 2018-07-12 VITALS — BP 118/72 | HR 54 | Temp 98.0°F | Ht 66.5 in | Wt 162.0 lb

## 2018-07-12 DIAGNOSIS — E119 Type 2 diabetes mellitus without complications: Secondary | ICD-10-CM | POA: Diagnosis not present

## 2018-07-12 DIAGNOSIS — I10 Essential (primary) hypertension: Secondary | ICD-10-CM | POA: Diagnosis not present

## 2018-07-12 DIAGNOSIS — E785 Hyperlipidemia, unspecified: Secondary | ICD-10-CM

## 2018-07-12 MED ORDER — ACCU-CHEK AVIVA VI SOLN: 0 refills | Status: DC

## 2018-07-12 MED ORDER — ACCU-CHEK SOFTCLIX LANCETS MISC: 3 refills | Status: DC

## 2018-07-12 MED ORDER — GLUCOSE BLOOD VI STRP: ORAL_STRIP | 3 refills | Status: DC

## 2018-07-12 MED ORDER — ACCU-CHEK AVIVA PLUS W/DEVICE KIT: PACK | 0 refills | Status: DC

## 2018-07-12 NOTE — Assessment & Plan Note (Signed)
Lovastatin 

## 2018-07-12 NOTE — Assessment & Plan Note (Signed)
Actos, Amaryl

## 2018-07-12 NOTE — Telephone Encounter (Signed)
RX sent

## 2018-07-12 NOTE — Progress Notes (Signed)
Subjective:  Patient ID: Jacob Chapman, male    DOB: [birth year]  Age: 73 y.o. MRN: [id number]  CC: No chief complaint on file.   HPI Jacob Chapman presents for DM, HTN, dyslipidemia f/u  Outpatient Medications Prior to Visit  Medication Sig Dispense Refill  . acyclovir (ZOVIRAX) 400 MG tablet TAKE 1 TABLET  3 TIMES DAILY FOR COLD SORES 21 tablet 3  . amLODipine (NORVASC) 5 MG tablet TAKE 1 AND 1/2 TABLETS EVERY DAY 135 tablet 3  . aspirin 81 MG EC tablet Take 81 mg by mouth daily.     . cholecalciferol (VITAMIN D) 1000 UNITS tablet daily.    . Cyanocobalamin (VITAMIN B12 PO) daily.    Marland Kitchen glimepiride (AMARYL) 2 MG tablet Take 2 tablets (4 mg total) by mouth daily before breakfast. 180 tablet 3  . losartan (COZAAR) 100 MG tablet TAKE 1 TABLET EVERY DAY 90 tablet 1  . lovastatin (MEVACOR) 20 MG tablet Take 1 tablet (20 mg total) by mouth at bedtime. 90 tablet 3  . metaxalone (SKELAXIN) 800 MG tablet Take 1 tablet (800 mg total) by mouth 3 (three) times daily as needed. 100 tablet 1  . pioglitazone (ACTOS) 45 MG tablet Take 1 tablet (45 mg total) by mouth daily. 90 tablet 3  . vitamin B-12 (CYANOCOBALAMIN) 500 MCG tablet Take 500 mcg by mouth daily.     . diclofenac (VOLTAREN) 75 MG EC tablet Take 1 tablet (75 mg total) by mouth 2 (two) times daily. (Patient not taking: Reported on 07/12/2018) 14 tablet 0  . mupirocin ointment (BACTROBAN) 2 % Apply 1 application topically 2 (two) times daily. (Patient not taking: Reported on 07/12/2018) 22 g 1   No facility-administered medications prior to visit.     ROS: Review of Systems  Constitutional: Negative for appetite change, fatigue and unexpected weight change.  HENT: Negative for congestion, nosebleeds, sneezing, sore throat and trouble swallowing.   Eyes: Negative for itching and visual disturbance.  Respiratory: Negative for cough.   Cardiovascular: Negative for chest pain, palpitations and leg swelling.  Gastrointestinal: Negative  for abdominal distention, blood in stool, diarrhea and nausea.  Genitourinary: Negative for frequency and hematuria.  Musculoskeletal: Negative for back pain, gait problem, joint swelling and neck pain.  Skin: Negative for rash.  Neurological: Negative for dizziness, tremors, speech difficulty and weakness.  Psychiatric/Behavioral: Negative for agitation, dysphoric mood and sleep disturbance. The patient is not nervous/anxious.     Objective:  BP 118/72 (BP Location: Left Arm, Patient Position: Sitting, Cuff Size: Normal)   Pulse (!) 54   Temp 98 F (36.7 C) (Oral)   Ht 5' 6.5" (1.689 m)   Wt 162 lb (73.5 kg)   SpO2 97%   BMI 25.76 kg/m   BP Readings from Last 3 Encounters:  07/12/18 118/72  06/07/18 (!) 148/87  03/08/18 122/78    Wt Readings from Last 3 Encounters:  07/12/18 162 lb (73.5 kg)  03/08/18 161 lb (73 kg)  10/26/17 164 lb (74.4 kg)    Physical Exam Constitutional:      General: He is not in acute distress.    Appearance: He is well-developed.     Comments: NAD  Eyes:     Conjunctiva/sclera: Conjunctivae normal.     Pupils: Pupils are equal, round, and reactive to light.  Neck:     Musculoskeletal: Normal range of motion.     Thyroid: No thyromegaly.     Vascular: No JVD.  Cardiovascular:  Rate and Rhythm: Normal rate and regular rhythm.     Heart sounds: Normal heart sounds. No murmur. No friction rub. No gallop.   Pulmonary:     Effort: Pulmonary effort is normal. No respiratory distress.     Breath sounds: Normal breath sounds. No wheezing or rales.  Chest:     Chest wall: No tenderness.  Abdominal:     General: Bowel sounds are normal. There is no distension.     Palpations: Abdomen is soft. There is no mass.     Tenderness: There is no abdominal tenderness. There is no guarding or rebound.  Musculoskeletal: Normal range of motion.        General: No tenderness.  Lymphadenopathy:     Cervical: No cervical adenopathy.  Skin:    General:  Skin is warm and dry.     Findings: No rash.  Neurological:     Mental Status: He is alert and oriented to person, place, and time.     Cranial Nerves: No cranial nerve deficit.     Motor: No abnormal muscle tone.     Coordination: Coordination normal.     Gait: Gait normal.     Deep Tendon Reflexes: Reflexes are normal and symmetric.  Psychiatric:        Behavior: Behavior normal.        Thought Content: Thought content normal.        Judgment: Judgment normal.     Lab Results  Component Value Date   WBC 8.3 09/20/2014   HGB 14.4 09/20/2014   HCT 43.4 (A) 09/20/2014   PLT 180 10/30/2011   GLUCOSE 114 (H) 07/05/2018   CHOL 112 03/01/2018   TRIG 41.0 03/01/2018   HDL 47.10 03/01/2018   LDLCALC 57 03/01/2018   ALT 18 03/01/2018   AST 20 03/01/2018   NA 140 07/05/2018   K 4.3 07/05/2018   CL 107 07/05/2018   CREATININE 0.95 07/05/2018   BUN 18 07/05/2018   CO2 27 07/05/2018   TSH 2.115 09/20/2014   PSA 0.3 01/30/2016   INR CANCELED 01/18/2014   HGBA1C 6.8 (H) 07/05/2018    No results found.  Assessment & Plan:   There are no diagnoses linked to this encounter.   No orders of the defined types were placed in this encounter.    Follow-up: No follow-ups on file.  Walker Kehr, MD

## 2018-07-12 NOTE — Assessment & Plan Note (Addendum)
CT ca score 37 2020 Amlodipine and Losartan

## 2018-07-12 NOTE — Patient Instructions (Signed)
Jacob Chapman, Please inform the patient that his CT cardiac calcium score is low. It indicates presence of mild coronary disease. It is good. He will benefit from taking his lovastatin as at present. CT of the lungs showed mild bronchitis of the lower lobes. Thanks, AP

## 2018-07-21 ENCOUNTER — Other Ambulatory Visit: Payer: Self-pay

## 2018-07-21 MED ORDER — AMLODIPINE BESYLATE 5 MG PO TABS: ORAL_TABLET | ORAL | 3 refills | Status: DC

## 2018-09-02 ENCOUNTER — Other Ambulatory Visit: Payer: Self-pay | Admitting: Internal Medicine

## 2018-09-05 DIAGNOSIS — E119 Type 2 diabetes mellitus without complications: Secondary | ICD-10-CM | POA: Diagnosis not present

## 2018-09-05 DIAGNOSIS — H25013 Cortical age-related cataract, bilateral: Secondary | ICD-10-CM | POA: Diagnosis not present

## 2018-09-05 DIAGNOSIS — E1136 Type 2 diabetes mellitus with diabetic cataract: Secondary | ICD-10-CM | POA: Diagnosis not present

## 2018-09-05 DIAGNOSIS — H524 Presbyopia: Secondary | ICD-10-CM | POA: Diagnosis not present

## 2018-09-05 LAB — HM DIABETES EYE EXAM

## 2018-09-09 ENCOUNTER — Encounter: Payer: Self-pay | Admitting: Internal Medicine

## 2018-10-04 DIAGNOSIS — D225 Melanocytic nevi of trunk: Secondary | ICD-10-CM | POA: Diagnosis not present

## 2018-10-04 DIAGNOSIS — L821 Other seborrheic keratosis: Secondary | ICD-10-CM | POA: Diagnosis not present

## 2018-10-04 DIAGNOSIS — L570 Actinic keratosis: Secondary | ICD-10-CM | POA: Diagnosis not present

## 2018-10-04 DIAGNOSIS — L301 Dyshidrosis [pompholyx]: Secondary | ICD-10-CM | POA: Diagnosis not present

## 2018-10-04 DIAGNOSIS — D1801 Hemangioma of skin and subcutaneous tissue: Secondary | ICD-10-CM | POA: Diagnosis not present

## 2018-10-04 DIAGNOSIS — L814 Other melanin hyperpigmentation: Secondary | ICD-10-CM | POA: Diagnosis not present

## 2018-10-18 ENCOUNTER — Other Ambulatory Visit (INDEPENDENT_AMBULATORY_CARE_PROVIDER_SITE_OTHER): Payer: Medicare HMO

## 2018-10-18 DIAGNOSIS — E785 Hyperlipidemia, unspecified: Secondary | ICD-10-CM | POA: Diagnosis not present

## 2018-10-18 DIAGNOSIS — M109 Gout, unspecified: Secondary | ICD-10-CM

## 2018-10-18 DIAGNOSIS — R209 Unspecified disturbances of skin sensation: Secondary | ICD-10-CM | POA: Diagnosis not present

## 2018-10-18 DIAGNOSIS — E119 Type 2 diabetes mellitus without complications: Secondary | ICD-10-CM | POA: Diagnosis not present

## 2018-10-18 DIAGNOSIS — I10 Essential (primary) hypertension: Secondary | ICD-10-CM

## 2018-10-18 LAB — BASIC METABOLIC PANEL
BUN: 17 mg/dL (ref 6–23)
CO2: 28 mEq/L (ref 19–32)
Calcium: 9 mg/dL (ref 8.4–10.5)
Chloride: 107 mEq/L (ref 96–112)
Creatinine, Ser: 1.02 mg/dL (ref 0.40–1.50)
GFR: 72.05 mL/min (ref >60.00)
Glucose, Bld: 112 mg/dL — ABNORMAL HIGH (ref 70–99)
Potassium: 4.4 mEq/L (ref 3.5–5.1)
Sodium: 141 mEq/L (ref 135–145)

## 2018-10-18 LAB — LIPID PANEL
Cholesterol: 108 mg/dL (ref 0–200)
HDL: 42 mg/dL (ref >39.00)
LDL Cholesterol: 55 mg/dL (ref 0–99)
NonHDL: 66.15
Total CHOL/HDL Ratio: 3
Triglycerides: 56 mg/dL (ref 0.0–149.0)
VLDL: 11.2 mg/dL (ref 0.0–40.0)

## 2018-10-18 LAB — HEMOGLOBIN A1C: Hgb A1c MFr Bld: 6.7 % — ABNORMAL HIGH (ref 4.6–6.5)

## 2018-10-18 LAB — HEPATIC FUNCTION PANEL
ALT: 16 U/L (ref 0–53)
AST: 17 U/L (ref 0–37)
Albumin: 4 g/dL (ref 3.5–5.2)
Alkaline Phosphatase: 67 U/L (ref 39–117)
Bilirubin, Direct: 0.1 mg/dL (ref 0.0–0.3)
Total Bilirubin: 0.5 mg/dL (ref 0.2–1.2)
Total Protein: 6.6 g/dL (ref 6.0–8.3)

## 2018-10-25 ENCOUNTER — Ambulatory Visit (INDEPENDENT_AMBULATORY_CARE_PROVIDER_SITE_OTHER): Payer: Medicare HMO | Admitting: Internal Medicine

## 2018-10-25 ENCOUNTER — Other Ambulatory Visit: Payer: Self-pay

## 2018-10-25 ENCOUNTER — Encounter: Payer: Self-pay | Admitting: Internal Medicine

## 2018-10-25 DIAGNOSIS — E785 Hyperlipidemia, unspecified: Secondary | ICD-10-CM

## 2018-10-25 DIAGNOSIS — E119 Type 2 diabetes mellitus without complications: Secondary | ICD-10-CM | POA: Diagnosis not present

## 2018-10-25 DIAGNOSIS — M1000 Idiopathic gout, unspecified site: Secondary | ICD-10-CM | POA: Diagnosis not present

## 2018-10-25 DIAGNOSIS — C61 Malignant neoplasm of prostate: Secondary | ICD-10-CM | POA: Diagnosis not present

## 2018-10-25 NOTE — Assessment & Plan Note (Signed)
XRT s/p

## 2018-10-25 NOTE — Assessment & Plan Note (Signed)
Lovastatin 

## 2018-10-25 NOTE — Progress Notes (Signed)
Subjective:  Patient ID: Jacob Chapman, male    DOB: 13-Jun-1945  Age: 73 y.o. MRN: [id number]  CC: No chief complaint on file.   HPI Jacob Chapman presents for DM, HTN, B12  f/u  Outpatient Medications Prior to Visit  Medication Sig Dispense Refill  . Accu-Chek Softclix Lancets lancets Use to check blood sugar daily. DX:E11.9 100 each 3  . acyclovir (ZOVIRAX) 400 MG tablet TAKE 1 TABLET  3 TIMES DAILY FOR COLD SORES 21 tablet 3  . amLODipine (NORVASC) 5 MG tablet TAKE 1 AND 1/2 TABLETS EVERY DAY 135 tablet 3  . aspirin 81 MG EC tablet Take 81 mg by mouth daily.     . Blood Glucose Calibration (ACCU-CHEK AVIVA) SOLN Use for blood sugar machine, DX: E11.9 1 each 0  . Blood Glucose Monitoring Suppl (ACCU-CHEK AVIVA PLUS) w/Device KIT TEST BLOOD SUGAR EVERY DAY 1 kit 0  . cholecalciferol (VITAMIN D) 1000 UNITS tablet daily.    . Cyanocobalamin (VITAMIN B12 PO) daily.    Marland Kitchen glimepiride (AMARYL) 2 MG tablet TAKE 2 TABLETS (4 MG TOTAL) BY MOUTH DAILY BEFORE BREAKFAST. 180 tablet 3  . glucose blood (ACCU-CHEK AVIVA PLUS) test strip Use to check blood sugar daily. DX:E11.9 100 each 3  . losartan (COZAAR) 100 MG tablet TAKE 1 TABLET EVERY DAY 90 tablet 1  . lovastatin (MEVACOR) 20 MG tablet TAKE 1 TABLET AT BEDTIME 90 tablet 3  . metaxalone (SKELAXIN) 800 MG tablet Take 1 tablet (800 mg total) by mouth 3 (three) times daily as needed. 100 tablet 1  . pioglitazone (ACTOS) 45 MG tablet TAKE 1 TABLET EVERY DAY 90 tablet 3  . vitamin B-12 (CYANOCOBALAMIN) 500 MCG tablet Take 500 mcg by mouth daily.      No facility-administered medications prior to visit.     ROS: Review of Systems  Constitutional: Negative for appetite change, fatigue and unexpected weight change.  HENT: Negative for congestion, nosebleeds, sneezing, sore throat and trouble swallowing.   Eyes: Negative for itching and visual disturbance.  Respiratory: Negative for cough.   Cardiovascular: Negative for chest pain,  palpitations and leg swelling.  Gastrointestinal: Negative for abdominal distention, blood in stool, diarrhea and nausea.  Genitourinary: Negative for frequency and hematuria.  Musculoskeletal: Negative for back pain, gait problem, joint swelling and neck pain.  Skin: Negative for rash.  Neurological: Negative for dizziness, tremors, speech difficulty and weakness.  Psychiatric/Behavioral: Negative for agitation, dysphoric mood and sleep disturbance. The patient is not nervous/anxious.     Objective:  BP 118/74 (BP Location: Left Arm, Patient Position: Sitting, Cuff Size: Normal)   Pulse (!) 56   Temp 98.1 F (36.7 C) (Oral)   Ht 5' 6.5" (1.689 m)   Wt 165 lb (74.8 kg)   SpO2 98%   BMI 26.23 kg/m   BP Readings from Last 3 Encounters:  10/25/18 118/74  07/12/18 118/72  06/07/18 (!) 148/87    Wt Readings from Last 3 Encounters:  10/25/18 165 lb (74.8 kg)  07/12/18 162 lb (73.5 kg)  03/08/18 161 lb (73 kg)    Physical Exam Constitutional:      General: He is not in acute distress.    Appearance: He is well-developed.     Comments: NAD  Eyes:     Conjunctiva/sclera: Conjunctivae normal.     Pupils: Pupils are equal, round, and reactive to light.  Neck:     Musculoskeletal: Normal range of motion.     Thyroid: No thyromegaly.  Vascular: No JVD.  Cardiovascular:     Rate and Rhythm: Normal rate and regular rhythm.     Heart sounds: Normal heart sounds. No murmur. No friction rub. No gallop.   Pulmonary:     Effort: Pulmonary effort is normal. No respiratory distress.     Breath sounds: Normal breath sounds. No wheezing or rales.  Chest:     Chest wall: No tenderness.  Abdominal:     General: Bowel sounds are normal. There is no distension.     Palpations: Abdomen is soft. There is no mass.     Tenderness: There is no abdominal tenderness. There is no guarding or rebound.  Musculoskeletal: Normal range of motion.        General: No tenderness.  Lymphadenopathy:      Cervical: No cervical adenopathy.  Skin:    General: Skin is warm and dry.     Findings: No rash.  Neurological:     Mental Status: He is alert and oriented to person, place, and time.     Cranial Nerves: No cranial nerve deficit.     Motor: No abnormal muscle tone.     Coordination: Coordination normal.     Gait: Gait normal.     Deep Tendon Reflexes: Reflexes are normal and symmetric.  Psychiatric:        Behavior: Behavior normal.        Thought Content: Thought content normal.        Judgment: Judgment normal.     Lab Results  Component Value Date   WBC 8.3 09/20/2014   HGB 14.4 09/20/2014   HCT 43.4 (A) 09/20/2014   PLT 180 10/30/2011   GLUCOSE 112 (H) 10/18/2018   CHOL 108 10/18/2018   TRIG 56.0 10/18/2018   HDL 42.00 10/18/2018   LDLCALC 55 10/18/2018   ALT 16 10/18/2018   AST 17 10/18/2018   NA 141 10/18/2018   K 4.4 10/18/2018   CL 107 10/18/2018   CREATININE 1.02 10/18/2018   BUN 17 10/18/2018   CO2 28 10/18/2018   TSH 2.115 09/20/2014   PSA 0.3 01/30/2016   INR CANCELED 01/18/2014   HGBA1C 6.7 (H) 10/18/2018    No results found.  Assessment & Plan:   There are no diagnoses linked to this encounter.   No orders of the defined types were placed in this encounter.    Follow-up: No follow-ups on file.  Walker Kehr, MD

## 2018-10-25 NOTE — Assessment & Plan Note (Signed)
Losartan, diet No relapse

## 2018-10-25 NOTE — Assessment & Plan Note (Signed)
On Lovastatin CT ca score 37 2020 On Actos and Amaryl

## 2018-11-01 DIAGNOSIS — D170 Benign lipomatous neoplasm of skin and subcutaneous tissue of head, face and neck: Secondary | ICD-10-CM | POA: Diagnosis not present

## 2018-11-18 ENCOUNTER — Other Ambulatory Visit: Payer: Self-pay | Admitting: Internal Medicine

## 2018-12-22 ENCOUNTER — Telehealth: Payer: Self-pay | Admitting: *Deleted

## 2018-12-22 DIAGNOSIS — D170 Benign lipomatous neoplasm of skin and subcutaneous tissue of head, face and neck: Secondary | ICD-10-CM

## 2018-12-22 NOTE — Telephone Encounter (Signed)
Copied from [place] [phone]. Topic: General - Inquiry >> Dec 22, 2018  2:11 PM Virl Axe D wrote: Reason for CRM: Marlowe Kays with Dr. Lucia Gaskins, ENT stated pt was seen on 11/01/18. His insurance did not cover his appt due to not having a referral. They would like to know if Dr. Alain Marion would be able to send a referral over to them. ICD D17.0 If not, they will just bill pt. Please advise. They know Dr. Alain Marion did  not initially refer him. [id number] FAX#[phone]

## 2018-12-23 NOTE — Telephone Encounter (Signed)
Will place a ref Thx

## 2018-12-29 NOTE — Telephone Encounter (Signed)
Referral faxed to Dr. Pollie Friar office [phone]

## 2019-01-08 IMAGING — CT CT HEART SCORING
2 series · 16 of 20 positions shown, 18 images · non-contrast
Comparison: None.

Addendum:
EXAM:
OVER-READ INTERPRETATION  CT CHEST

The following report is an over-read performed by radiologist Dr.
Tjinotjiua Jantjies [REDACTED] on 04/05/2018. This
over-read does not include interpretation of cardiac or coronary
anatomy or pathology. The coronary calcium score interpretation by
the cardiologist is attached.
CLINICAL DATA: Risk stratification
Coronary Calcium Score
TECHNIQUE: The patient was scanned on a Siemens Somatom 64 slice scanner. Axial
non-contrast 3 mm slices were carried out through the heart. The
data set was analyzed on a dedicated work station and scored using
the Agatson method.

[Series 3: casc 3.0 i36f 2 bestdiast 69 % · axial · 0.32mm/px · z∈[+1226,+1304]mm · 8 of 34 slices shown, 10 images]
[im 4/34  vessel]
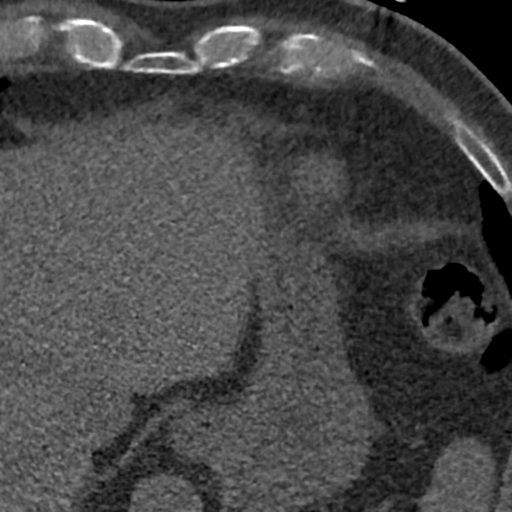
[im 4/34  lung]
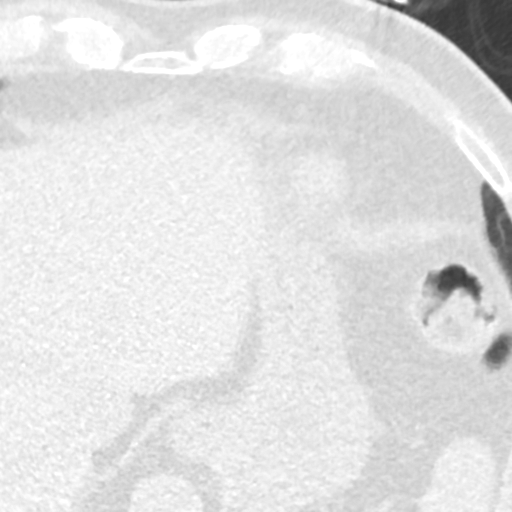
[im 8/34  vessel]
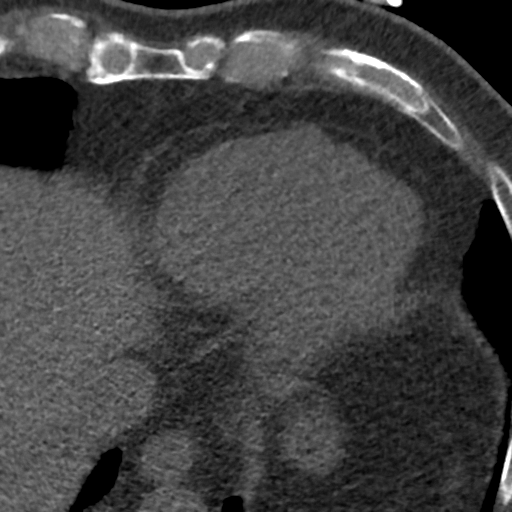
[im 12/34  vessel]
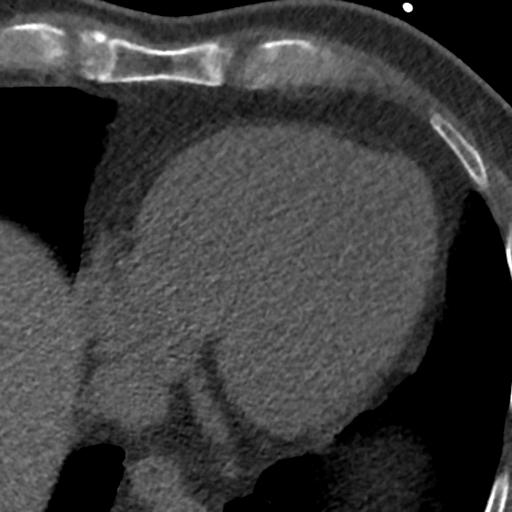
[im 15/34  vessel]
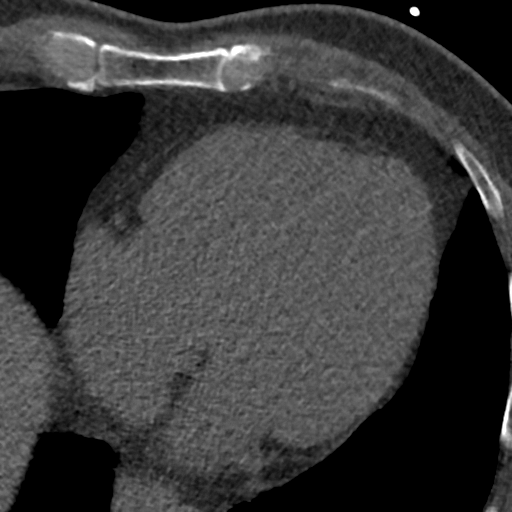
[im 19/34  vessel]
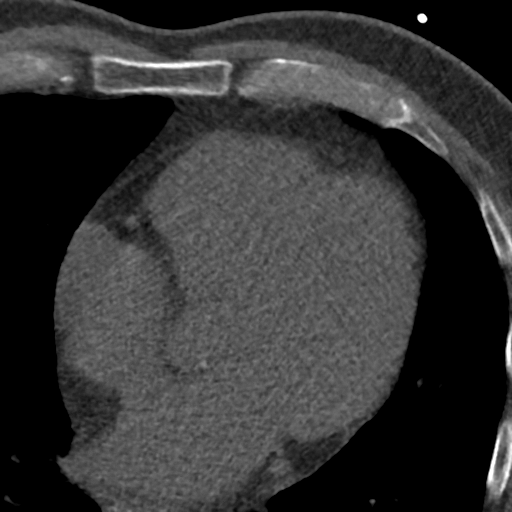
[im 19/34  lung]
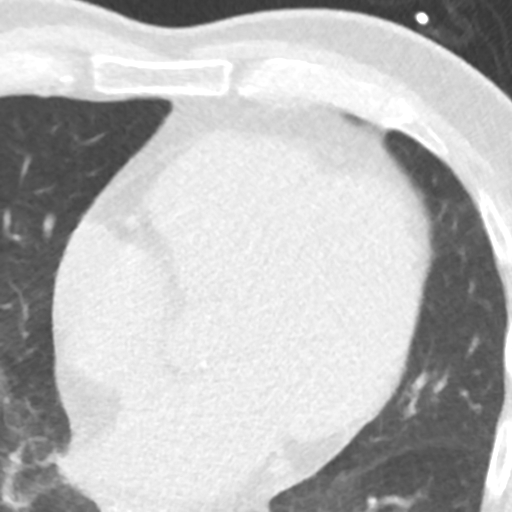
[im 23/34  vessel]
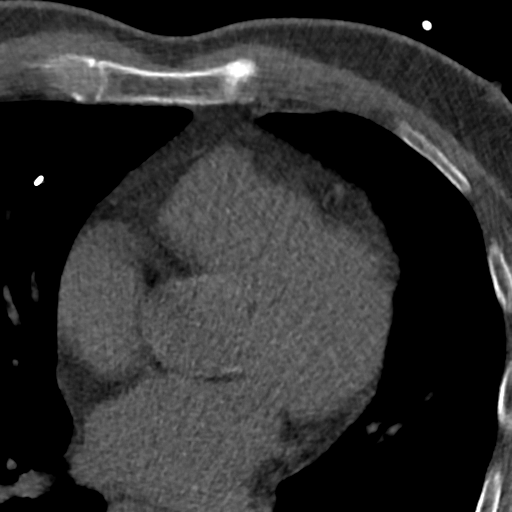
[im 26/34  vessel]
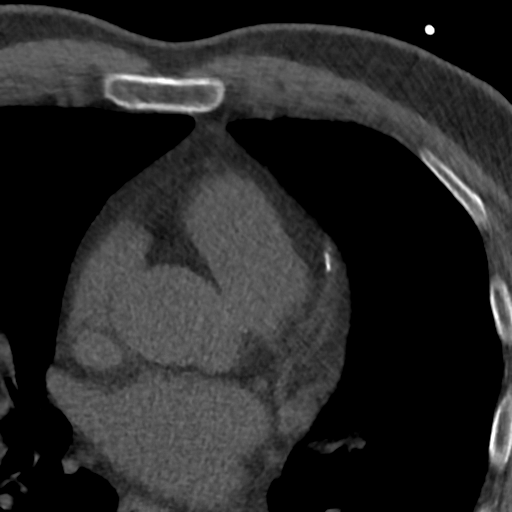
[im 30/34  vessel]
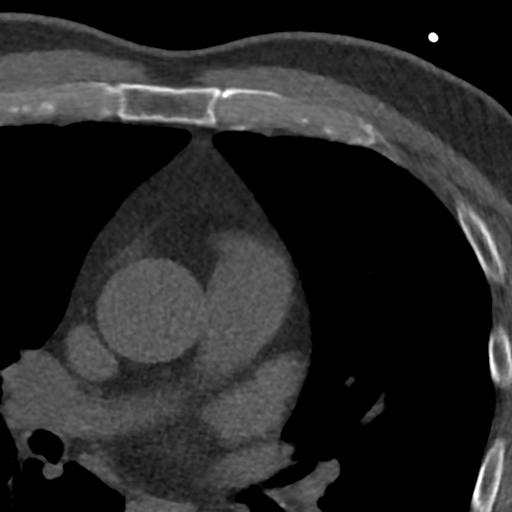

[Series 5: lung st 68 % · axial · 0.60mm/px · z∈[+1228,+1306]mm · 8 of 34 slices shown]
[im 4/34  lung]
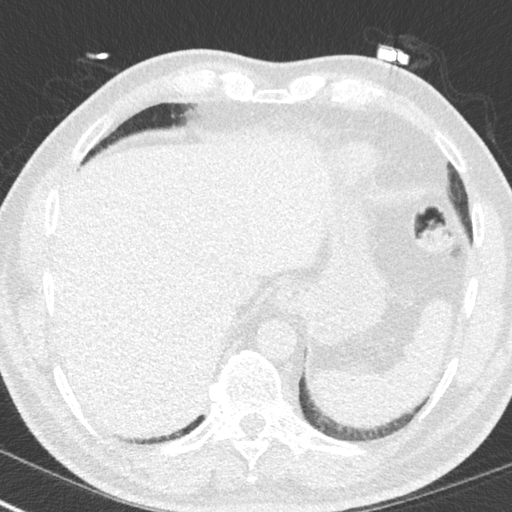
[im 8/34  lung]
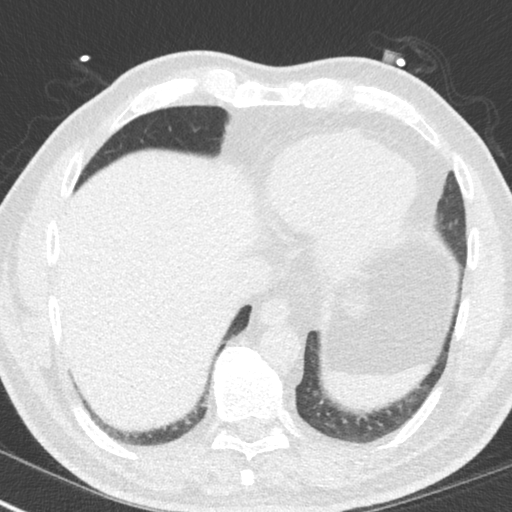
[im 12/34  lung]
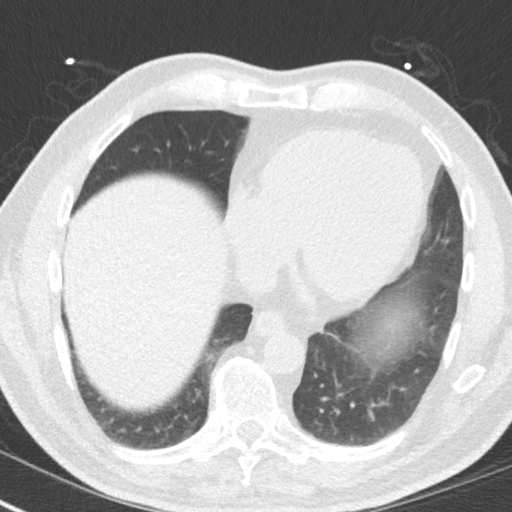
[im 15/34  lung]
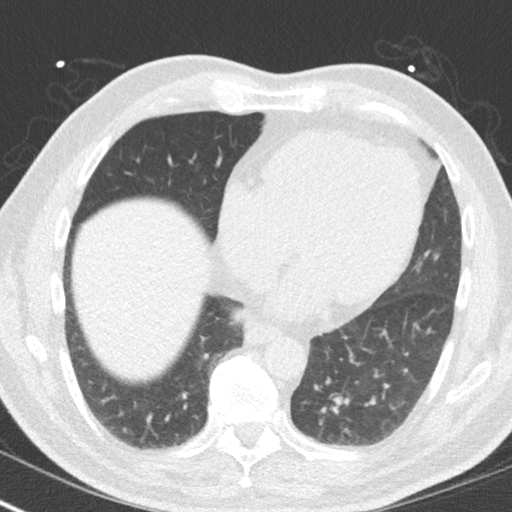
[im 19/34  lung]
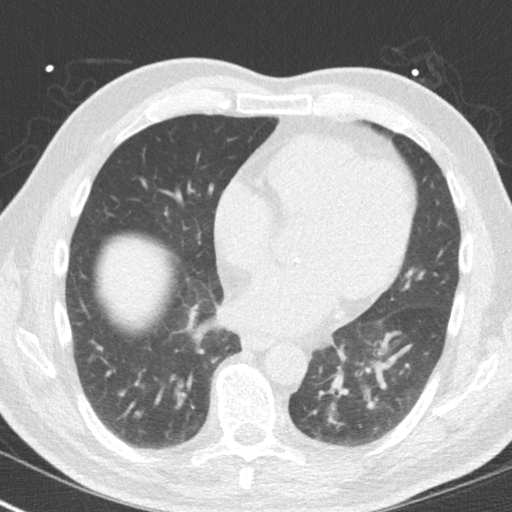
[im 23/34  lung]
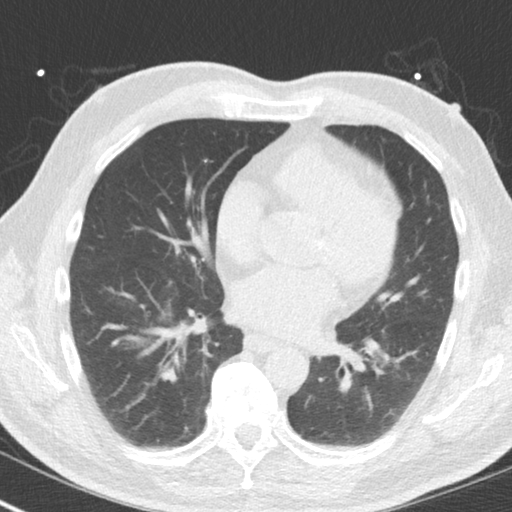
[im 26/34  lung]
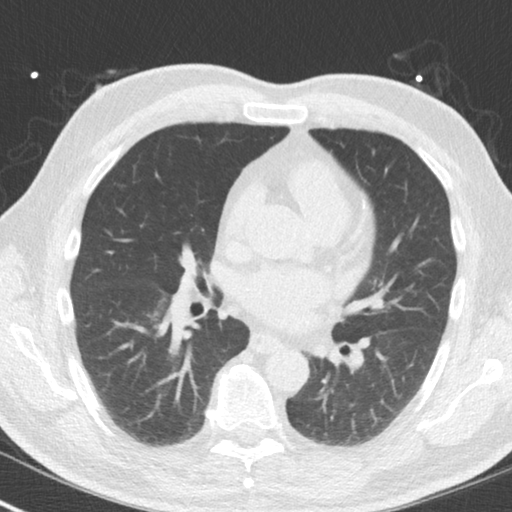
[im 30/34  lung]
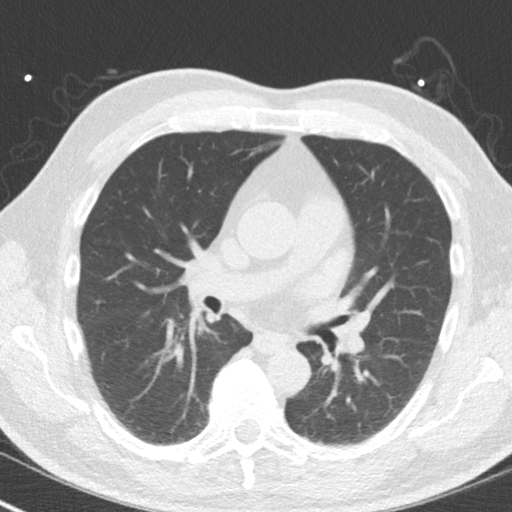

[16 of 20 positions shown; findings below may reference images not displayed]

FINDINGS: Vascular: Heart and visualized aorta normal caliber.

Mediastinum/Nodes: No adenopathy in the lower mediastinum or hila.

Lungs/Pleura: Mild bronchial wall thickening in the lower lobes
suggesting bronchitis. No effusions or confluent opacities.

Upper Abdomen: Imaging into the upper abdomen shows no acute
findings.

Musculoskeletal: Chest wall soft tissues are unremarkable. No acute
bony abnormality.
IMPRESSION: Mild airway thickening in the lower lobes suggest bronchitis.
FINDINGS: Non-cardiac: See separate report from [REDACTED].

Ascending aorta: Normal diameter 3.6 cm

Pericardium: Normal

Coronary arteries: Calium noted through out LAD
IMPRESSION: Coronary calcium score of 37. This was 30 th percentile for age and
sex matched control.

Criistiano Cereda

*** End of Addendum ***

## 2019-01-17 ENCOUNTER — Other Ambulatory Visit (INDEPENDENT_AMBULATORY_CARE_PROVIDER_SITE_OTHER): Payer: Medicare HMO

## 2019-01-17 DIAGNOSIS — M109 Gout, unspecified: Secondary | ICD-10-CM | POA: Diagnosis not present

## 2019-01-17 DIAGNOSIS — I10 Essential (primary) hypertension: Secondary | ICD-10-CM

## 2019-01-17 DIAGNOSIS — R209 Unspecified disturbances of skin sensation: Secondary | ICD-10-CM | POA: Diagnosis not present

## 2019-01-17 DIAGNOSIS — E785 Hyperlipidemia, unspecified: Secondary | ICD-10-CM

## 2019-01-17 DIAGNOSIS — E119 Type 2 diabetes mellitus without complications: Secondary | ICD-10-CM

## 2019-01-17 LAB — BASIC METABOLIC PANEL
BUN: 11 mg/dL (ref 6–23)
CO2: 26 mEq/L (ref 19–32)
Calcium: 9.3 mg/dL (ref 8.4–10.5)
Chloride: 107 mEq/L (ref 96–112)
Creatinine, Ser: 0.99 mg/dL (ref 0.40–1.50)
GFR: 74.52 mL/min (ref >60.00)
Glucose, Bld: 109 mg/dL — ABNORMAL HIGH (ref 70–99)
Potassium: 4.5 mEq/L (ref 3.5–5.1)
Sodium: 141 mEq/L (ref 135–145)

## 2019-01-17 LAB — LIPID PANEL
Cholesterol: 107 mg/dL (ref 0–200)
HDL: 44.6 mg/dL (ref >39.00)
LDL Cholesterol: 53 mg/dL (ref 0–99)
NonHDL: 62.66
Total CHOL/HDL Ratio: 2
Triglycerides: 50 mg/dL (ref 0.0–149.0)
VLDL: 10 mg/dL (ref 0.0–40.0)

## 2019-01-17 LAB — HEPATIC FUNCTION PANEL
ALT: 15 U/L (ref 0–53)
AST: 16 U/L (ref 0–37)
Albumin: 4 g/dL (ref 3.5–5.2)
Alkaline Phosphatase: 66 U/L (ref 39–117)
Bilirubin, Direct: 0.1 mg/dL (ref 0.0–0.3)
Total Bilirubin: 0.6 mg/dL (ref 0.2–1.2)
Total Protein: 6.6 g/dL (ref 6.0–8.3)

## 2019-01-17 LAB — HEMOGLOBIN A1C: Hgb A1c MFr Bld: 6.8 % — ABNORMAL HIGH (ref 4.6–6.5)

## 2019-01-23 NOTE — Progress Notes (Addendum)
Subjective:   Jacob Chapman is a 73 y.o. male who presents for an Initial Medicare Annual Wellness Visit.  Review of Systems   Cardiac Risk Factors include: advanced age (>69mn, >>81women);diabetes mellitus;male gender Sleep patterns: feels rested on waking, gets up 1 times nightly to void and sleeps 7-8 hours nightly.    Home Safety/Smoke Alarms: Feels safe in home. Smoke alarms in place.  Living environment; residence and Firearm Safety: 1-story house/ trailer. Lives alone, no needs for DME, good support system Seat Belt Safety/Bike Helmet: Wears seat belt.     Objective:    There were no vitals filed for this visit. There is no height or weight on file to calculate BMI.  Advanced Directives 01/24/2019 08/07/2015 11/06/2011  Does Patient Have a Medical Advance Directive? Yes No Patient does not have advance directive  Type of AScientist, forensicPower of [place] will - -  Copy of [place] Chart? No - copy requested - -  Pre-existing out of facility DNR order (yellow form or pink MOST form) - - No    Current Medications (verified) Outpatient Encounter Medications as of 01/24/2019  Medication Sig   Accu-Chek Softclix Lancets lancets Use to check blood sugar daily. DX:E11.9   acyclovir (ZOVIRAX) 400 MG tablet TAKE 1 TABLET  3 TIMES DAILY FOR COLD SORES   amLODipine (NORVASC) 5 MG tablet TAKE 1 AND 1/2 TABLETS EVERY DAY   aspirin 81 MG EC tablet Take 81 mg by mouth daily.    Blood Glucose Calibration (ACCU-CHEK AVIVA) SOLN Use for blood sugar machine, DX: E11.9   Blood Glucose Monitoring Suppl (ACCU-CHEK AVIVA PLUS) w/Device KIT TEST BLOOD SUGAR EVERY DAY   cholecalciferol (VITAMIN D) 1000 UNITS tablet daily.   Cyanocobalamin (VITAMIN B12 PO) daily.   glimepiride (AMARYL) 2 MG tablet TAKE 2 TABLETS (4 MG TOTAL) BY MOUTH DAILY BEFORE BREAKFAST.   glucose blood (ACCU-CHEK AVIVA PLUS) test strip Use to check blood sugar daily.  DX:E11.9   losartan (COZAAR) 100 MG tablet TAKE 1 TABLET EVERY DAY   lovastatin (MEVACOR) 20 MG tablet TAKE 1 TABLET AT BEDTIME   metaxalone (SKELAXIN) 800 MG tablet Take 1 tablet (800 mg total) by mouth 3 (three) times daily as needed.   pioglitazone (ACTOS) 45 MG tablet TAKE 1 TABLET EVERY DAY   vitamin B-12 (CYANOCOBALAMIN) 500 MCG tablet Take 500 mcg by mouth daily.    No facility-administered encounter medications on file as of 01/24/2019.     Allergies (verified) Metformin   History: Past Medical History:  Diagnosis Date   Bulging lumbar disc    no current prob   Chronic lower back pain    Hx - no current prob   Diabetes mellitus, type 2 ([place]    type II   History of gout stable -- last bout 3 yrs ago   no current prob   HTN (hypertension)    Hyperlipidemia    Prostate cancer ([place] 09/01/11   dx Adenocarcinoma   Past Surgical History:  Procedure Laterality Date   COLONOSCOPY  2005, 2017   CYSTOSCOPY  11/06/2011   Procedure: CYSTOSCOPY FLEXIBLE;  Surgeon: MClaybon Jabs MD;  [facility]  Service: Urology;  Laterality: N/A;  no seeds found in bladder   PROSTATE BIOPSY  09/01/2011   DR OTTELIN'S OFFICE   gleason 3+3=6,volume=21cc,PSA=6.73   RADIOACTIVE SEED IMPLANT  11/06/2011   Procedure: RADIOACTIVE SEED IMPLANT;  Surgeon: MClaybon Jabs MD;  Location:  [place];  Service: Urology;  Laterality: Bilateral;  3 seeds implanted   VASECTOMY     Family History  Adopted: Yes  Problem Relation Age of Onset   Stroke Mother        66   Cancer Father 61       lung ca   Heart disease Sister 50       rhematic fever   Hypertension Other 36       maternal 1st cousin,leukemia deceased   Colon cancer Neg Hx    Colon polyps Neg Hx    Esophageal cancer Neg Hx    Rectal cancer Neg Hx    Stomach cancer Neg Hx    Social History   Socioeconomic History   Marital status: Widowed    Spouse name: Not on file     Number of children: 1   Years of education: Not on file   Highest education level: Not on file  Occupational History   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, chartered for [employer] strain: Not hard at [employer] insecurity    Worry: Never true    Inability: Never true   Transportation needs    Medical: No    Non-medical: No  Tobacco Use   Smoking status: Never Smoker   Smokeless tobacco: Never Used  Substance and Sexual Activity   Alcohol use: No    Alcohol/week: 0.0 standard drinks   Drug use: No   Sexual activity: Not Currently  Lifestyle   Physical activity    Days per week: 5 days    Minutes per session: 40 min   Stress: Not on file  Relationships   Social connections    Talks on phone: More than three times a week    Gets together: More than three times a week    Attends religious service: 1 to 4 times per year    Active member of club or organization: Yes    Attends meetings of clubs or organizations: 1 to 4 times per year    Relationship status: Widowed  Other Topics Concern   Not on file  Social History Narrative   Regular exercise - YES         Tobacco Counseling Counseling given: Not Answered  Activities of Daily Living In your present state of health, do you have any difficulty performing the following activities: 01/24/2019  Hearing? N  Vision? N  Difficulty concentrating or making decisions? N  Walking or climbing stairs? N  Dressing or bathing? N  Doing errands, shopping? N  Preparing Food and eating ? N  Using the Toilet? N  In the past six months, have you accidently leaked urine? N  Do you have problems with loss of bowel control? N  Managing your Medications? N  Managing your Finances? N  Housekeeping or managing your Housekeeping? N  Some recent data might be hidden     Immunizations and Health Maintenance Immunization History   Administered Date(s) Administered   Fluad Quad(high Dose 65+) 01/24/2019   Influenza, High Dose Seasonal PF 02/17/2017, 03/08/2018   Pneumococcal Conjugate-13 10/08/2015   Pneumococcal Polysaccharide-23 09/29/2016   Td 10/24/2013   Zoster 05/06/2009   Health Maintenance Due  Topic Date Due   Hepatitis C Screening  02-26-1946   FOOT EXAM  10/07/2016   INFLUENZA VACCINE  11/19/2018    Patient Care Team: Plotnikov,  Evie Lacks, MD as PCP - General Kathie Rhodes, MD as Attending Physician (Urology)  Indicate any recent Medical Services you may have received from other than [facility] providers in the past year (date may be approximate).    Assessment:   This is a routine wellness examination for Jacob Chapman. Physical assessment deferred to PCP.  Hearing/Vision screen  Hearing Screening   125Hz  250Hz  500Hz  1000Hz  2000Hz  3000Hz  4000Hz  6000Hz  8000Hz   Right ear:           Left ear:           Comments: Able to hear conversational tones w/o difficulty. No issues reported.    Vision Screening Comments: appointment yearly   Dietary issues and exercise activities discussed: Current Exercise Habits: Home exercise routine, Type of exercise: calisthenics;walking, Time (Minutes): 40, Frequency (Times/Week): 5, Weekly Exercise (Minutes/Week): 200, Intensity: Mild, Exercise limited by: None identified  Diet (meal preparation, eat out, water intake, caffeinated beverages, dairy products, fruits and vegetables): in general, a "healthy" diet  , well balanced. eats a variety of fruits and vegetables daily, limits salt, fat/cholesterol, sugar,carbohydrates,caffeine, drinks 6-8 glasses of water daily.  Goals     Patient Stated     Maintain my current health status by continuing to eat healthy and be active.      Depression Screen PHQ 2/9 Scores 01/24/2019 10/26/2017 07/25/2016 01/30/2016  PHQ - 2 Score 0 0 0 0    Fall Risk Fall Risk  01/24/2019 10/26/2017 07/25/2016 01/30/2016 10/23/2014  Falls in the  past year? 0 No No No No  Follow up Falls evaluation completed - - - -   Cognitive Function:       Ad8 score reviewed for issues:  Issues making decisions: no  Less interest in hobbies / activities: no  Repeats questions, stories (family complaining): no  Trouble using ordinary gadgets (microwave, computer, phone):no  Forgets the month or year: no  Mismanaging finances: no  Remembering appts: no  Daily problems with thinking and/or memory: no Ad8 score is= 0  Screening Tests Health Maintenance  Topic Date Due   Hepatitis C Screening  07-25-1945   FOOT EXAM  10/07/2016   INFLUENZA VACCINE  11/19/2018   HEMOGLOBIN A1C  07/17/2019   OPHTHALMOLOGY EXAM  09/05/2019   TETANUS/TDAP  10/25/2023   COLONOSCOPY  08/20/2025   PNA vac Low Risk Adult  Completed      Plan:    Reviewed health maintenance screenings with patient today and relevant education, vaccines, and/or referrals were provided.   I have personally reviewed and noted the following in the patients chart:    Medical and social history  Use of alcohol, tobacco or illicit drugs   Current medications and supplements  Functional ability and status  Nutritional status  Physical activity  Advanced directives  List of other physicians  Vitals  Screenings to include cognitive, depression, and falls  Referrals and appointments  In addition, I have reviewed and discussed with patient certain preventive protocols, quality metrics, and best practice recommendations. A written personalized care plan for preventive services as well as general preventive health recommendations were provided to patient.     Michiel Cowboy, RN   01/24/2019     Medical screening examination/treatment/procedure(s) were performed by non-physician practitioner and as supervising physician I was immediately available for consultation/collaboration. I agree with above. Lew Dawes, MD

## 2019-01-24 ENCOUNTER — Encounter: Payer: Self-pay | Admitting: Internal Medicine

## 2019-01-24 ENCOUNTER — Other Ambulatory Visit: Payer: Self-pay

## 2019-01-24 ENCOUNTER — Ambulatory Visit (INDEPENDENT_AMBULATORY_CARE_PROVIDER_SITE_OTHER): Payer: Medicare HMO | Admitting: Internal Medicine

## 2019-01-24 ENCOUNTER — Ambulatory Visit: Payer: Medicare HMO | Admitting: *Deleted

## 2019-01-24 VITALS — BP 118/76 | HR 58 | Ht 66.0 in | Wt 164.0 lb

## 2019-01-24 VITALS — BP 118/76 | HR 58 | Temp 97.7°F | Ht 66.5 in | Wt 164.0 lb

## 2019-01-24 DIAGNOSIS — E119 Type 2 diabetes mellitus without complications: Secondary | ICD-10-CM

## 2019-01-24 DIAGNOSIS — C61 Malignant neoplasm of prostate: Secondary | ICD-10-CM | POA: Diagnosis not present

## 2019-01-24 DIAGNOSIS — Z23 Encounter for immunization: Secondary | ICD-10-CM | POA: Diagnosis not present

## 2019-01-24 DIAGNOSIS — E785 Hyperlipidemia, unspecified: Secondary | ICD-10-CM

## 2019-01-24 DIAGNOSIS — I10 Essential (primary) hypertension: Secondary | ICD-10-CM

## 2019-01-24 NOTE — Assessment & Plan Note (Signed)
On Actos and Amaryl 

## 2019-01-24 NOTE — Progress Notes (Signed)
Subjective:  Patient ID: Jacob Chapman, male    DOB: 26-Dec-1945  Age: 73 y.o. MRN: [id number]  CC: No chief complaint on file.   HPI Jacob Chapman presents for DM, HTN, dyslipidemia f/u  Outpatient Medications Prior to Visit  Medication Sig Dispense Refill  . Accu-Chek Softclix Lancets lancets Use to check blood sugar daily. DX:E11.9 100 each 3  . acyclovir (ZOVIRAX) 400 MG tablet TAKE 1 TABLET  3 TIMES DAILY FOR COLD SORES 21 tablet 3  . amLODipine (NORVASC) 5 MG tablet TAKE 1 AND 1/2 TABLETS EVERY DAY 135 tablet 3  . aspirin 81 MG EC tablet Take 81 mg by mouth daily.     . Blood Glucose Calibration (ACCU-CHEK AVIVA) SOLN Use for blood sugar machine, DX: E11.9 1 each 0  . Blood Glucose Monitoring Suppl (ACCU-CHEK AVIVA PLUS) w/Device KIT TEST BLOOD SUGAR EVERY DAY 1 kit 0  . cholecalciferol (VITAMIN D) 1000 UNITS tablet daily.    . Cyanocobalamin (VITAMIN B12 PO) daily.    Marland Kitchen glimepiride (AMARYL) 2 MG tablet TAKE 2 TABLETS (4 MG TOTAL) BY MOUTH DAILY BEFORE BREAKFAST. 180 tablet 3  . glucose blood (ACCU-CHEK AVIVA PLUS) test strip Use to check blood sugar daily. DX:E11.9 100 each 3  . losartan (COZAAR) 100 MG tablet TAKE 1 TABLET EVERY DAY 90 tablet 3  . lovastatin (MEVACOR) 20 MG tablet TAKE 1 TABLET AT BEDTIME 90 tablet 3  . metaxalone (SKELAXIN) 800 MG tablet Take 1 tablet (800 mg total) by mouth 3 (three) times daily as needed. 100 tablet 1  . pioglitazone (ACTOS) 45 MG tablet TAKE 1 TABLET EVERY DAY 90 tablet 3  . vitamin B-12 (CYANOCOBALAMIN) 500 MCG tablet Take 500 mcg by mouth daily.      No facility-administered medications prior to visit.     ROS: Review of Systems  Constitutional: Negative for appetite change, fatigue and unexpected weight change.  HENT: Negative for congestion, nosebleeds, sneezing, sore throat and trouble swallowing.   Eyes: Negative for itching and visual disturbance.  Respiratory: Negative for cough.   Cardiovascular: Negative for chest pain,  palpitations and leg swelling.  Gastrointestinal: Negative for abdominal distention, blood in stool, diarrhea and nausea.  Genitourinary: Negative for frequency and hematuria.  Musculoskeletal: Negative for back pain, gait problem, joint swelling and neck pain.  Skin: Negative for rash.  Neurological: Negative for dizziness, tremors, speech difficulty and weakness.  Psychiatric/Behavioral: Negative for agitation, dysphoric mood and sleep disturbance. The patient is not nervous/anxious.     Objective:  BP 118/76 (BP Location: Left Arm, Patient Position: Sitting, Cuff Size: Normal)   Pulse (!) 58   Temp 97.7 F (36.5 C) (Oral)   Ht 5' 6.5" (1.689 m)   Wt 164 lb (74.4 kg)   SpO2 97%   BMI 26.07 kg/m   BP Readings from Last 3 Encounters:  01/24/19 118/76  10/25/18 118/74  07/12/18 118/72    Wt Readings from Last 3 Encounters:  01/24/19 164 lb (74.4 kg)  10/25/18 165 lb (74.8 kg)  07/12/18 162 lb (73.5 kg)    Physical Exam Constitutional:      General: He is not in acute distress.    Appearance: He is well-developed.     Comments: NAD  Eyes:     Conjunctiva/sclera: Conjunctivae normal.     Pupils: Pupils are equal, round, and reactive to light.  Neck:     Musculoskeletal: Normal range of motion.     Thyroid: No thyromegaly.  Vascular: No JVD.  Cardiovascular:     Rate and Rhythm: Normal rate and regular rhythm.     Heart sounds: Normal heart sounds. No murmur. No friction rub. No gallop.   Pulmonary:     Effort: Pulmonary effort is normal. No respiratory distress.     Breath sounds: Normal breath sounds. No wheezing or rales.  Chest:     Chest wall: No tenderness.  Abdominal:     General: Bowel sounds are normal. There is no distension.     Palpations: Abdomen is soft. There is no mass.     Tenderness: There is no abdominal tenderness. There is no guarding or rebound.  Musculoskeletal: Normal range of motion.        General: No tenderness.  Lymphadenopathy:      Cervical: No cervical adenopathy.  Skin:    General: Skin is warm and dry.     Findings: No rash.  Neurological:     Mental Status: He is alert and oriented to person, place, and time.     Cranial Nerves: No cranial nerve deficit.     Motor: No abnormal muscle tone.     Coordination: Coordination normal.     Gait: Gait normal.     Deep Tendon Reflexes: Reflexes are normal and symmetric.  Psychiatric:        Behavior: Behavior normal.        Thought Content: Thought content normal.        Judgment: Judgment normal.     Lab Results  Component Value Date   WBC 8.3 09/20/2014   HGB 14.4 09/20/2014   HCT 43.4 (A) 09/20/2014   PLT 180 10/30/2011   GLUCOSE 109 (H) 01/17/2019   CHOL 107 01/17/2019   TRIG 50.0 01/17/2019   HDL 44.60 01/17/2019   LDLCALC 53 01/17/2019   ALT 15 01/17/2019   AST 16 01/17/2019   NA 141 01/17/2019   K 4.5 01/17/2019   CL 107 01/17/2019   CREATININE 0.99 01/17/2019   BUN 11 01/17/2019   CO2 26 01/17/2019   TSH 2.115 09/20/2014   PSA 0.3 01/30/2016   INR CANCELED 01/18/2014   HGBA1C 6.8 (H) 01/17/2019    No results found.  Assessment & Plan:   There are no diagnoses linked to this encounter.   No orders of the defined types were placed in this encounter.    Follow-up: No follow-ups on file.  Walker Kehr, MD

## 2019-01-24 NOTE — Assessment & Plan Note (Signed)
On Lovastatin CT ca score 37 2020

## 2019-01-24 NOTE — Patient Instructions (Signed)
Continue doing brain stimulating activities (puzzles, reading, adult coloring books, staying active) to keep memory sharp.   Continue to eat heart healthy diet (full of fruits, vegetables, whole grains, lean protein, water--limit salt, fat, and sugar intake) and increase physical activity as tolerated.   Jacob Chapman , Thank you for taking time to come for your Medicare Wellness Visit. I appreciate your ongoing commitment to your health goals. Please review the following plan we discussed and let me know if I can assist you in the future.   These are the goals we discussed: Goals    . Patient Stated     Maintain my current health coach.       This is a list of the screening recommended for you and due dates:  Health Maintenance  Topic Date Due  .  Hepatitis C: One time screening is recommended by Center for Disease Control  (CDC) for  adults born from [birth year] through 1963.   27-Apr-1945  . Complete foot exam   10/07/2016  . Flu Shot  11/19/2018  . Hemoglobin A1C  07/17/2019  . Eye exam for diabetics  09/05/2019  . Tetanus Vaccine  10/25/2023  . Colon Cancer Screening  08/20/2025  . Pneumonia vaccines  Completed    Preventive Care 71 Years and Older, Male Preventive care refers to lifestyle choices and visits with your health care provider that can promote health and wellness. This includes:  A yearly physical exam. This is also called an annual well check.  Regular dental and eye exams.  Immunizations.  Screening for certain conditions.  Healthy lifestyle choices, such as diet and exercise. What can I expect for my preventive care visit? Physical exam Your health care provider will check:  Height and weight. These may be used to calculate body mass index (BMI), which is a measurement that tells if you are at a healthy weight.  Heart rate and blood pressure.  Your skin for abnormal spots. Counseling Your health care provider may ask you questions about:  Alcohol, tobacco,  and drug use.  Emotional well-being.  Home and relationship well-being.  Sexual activity.  Eating habits.  History of falls.  Memory and ability to understand (cognition).  Work and work Statistician. What immunizations do I need?  Influenza (flu) vaccine  This is recommended every year. Tetanus, diphtheria, and pertussis (Tdap) vaccine  You may need a Td booster every 10 years. Varicella (chickenpox) vaccine  You may need this vaccine if you have not already been vaccinated. Zoster (shingles) vaccine  You may need this after age 64. Pneumococcal conjugate (PCV13) vaccine  One dose is recommended after age 71. Pneumococcal polysaccharide (PPSV23) vaccine  One dose is recommended after age 71. Measles, mumps, and rubella (MMR) vaccine  You may need at least one dose of MMR if you were born in 1957 or later. You may also need a second dose. Meningococcal conjugate (MenACWY) vaccine  You may need this if you have certain conditions. Hepatitis A vaccine  You may need this if you have certain conditions or if you travel or work in places where you may be exposed to hepatitis A. Hepatitis B vaccine  You may need this if you have certain conditions or if you travel or work in places where you may be exposed to hepatitis B. Haemophilus influenzae type b (Hib) vaccine  You may need this if you have certain conditions. You may receive vaccines as individual doses or as more than one vaccine together in one  shot (combination vaccines). Talk with your health care provider about the risks and benefits of combination vaccines. What tests do I need? Blood tests  Lipid and cholesterol levels. These may be checked every 5 years, or more frequently depending on your overall health.  Hepatitis C test.  Hepatitis B test. Screening  Lung cancer screening. You may have this screening every year starting at age 71 if you have a 30-pack-year history of smoking and currently smoke  or have quit within the past 15 years.  Colorectal cancer screening. All adults should have this screening starting at age 71 and continuing until age 24. Your health care provider may recommend screening at age 71 if you are at increased risk. You will have tests every 1-10 years, depending on your results and the type of screening test.  Prostate cancer screening. Recommendations will vary depending on your family history and other risks.  Diabetes screening. This is done by checking your blood sugar (glucose) after you have not eaten for a while (fasting). You may have this done every 1-3 years.  Abdominal aortic aneurysm (AAA) screening. You may need this if you are a current or former smoker.  Sexually transmitted disease (STD) testing. Follow these instructions at home: Eating and drinking  Eat a diet that includes fresh fruits and vegetables, whole grains, lean protein, and low-fat dairy products. Limit your intake of foods with high amounts of sugar, saturated fats, and salt.  Take vitamin and mineral supplements as recommended by your health care provider.  Do not drink alcohol if your health care provider tells you not to drink.  If you drink alcohol: ? Limit how much you have to 0-2 drinks a day. ? Be aware of how much alcohol is in your drink. In the [place] one drink equals one 12 oz bottle of beer (355 mL), one 5 oz glass of wine (148 mL), or one 1 oz glass of hard liquor (44 mL). Lifestyle  Take daily care of your teeth and gums.  Stay active. Exercise for at least 30 minutes on 5 or more days each week.  Do not use any products that contain nicotine or tobacco, such as cigarettes, e-cigarettes, and chewing tobacco. If you need help quitting, ask your health care provider.  If you are sexually active, practice safe sex. Use a condom or other form of protection to prevent STIs (sexually transmitted infections).  Talk with your health care provider about taking a low-dose  aspirin or statin. What's next?  Visit your health care provider once a year for a well check visit.  Ask your health care provider how often you should have your eyes and teeth checked.  Stay up to date on all vaccines. This information is not intended to replace advice given to you by your health care provider. Make sure you discuss any questions you have with your health care provider. Document Released: 05/03/2015 Document Revised: 03/31/2018 Document Reviewed: 03/31/2018 Elsevier Patient Education  2020 [employer].

## 2019-01-24 NOTE — Assessment & Plan Note (Signed)
On Amlodipine and Losartan Nl BP at home

## 2019-01-24 NOTE — Assessment & Plan Note (Signed)
F/u w/Urology 

## 2019-01-24 NOTE — Addendum Note (Signed)
Addended by: Karren Cobble on: 01/24/2019 10:49 AM   Modules accepted: Orders

## 2019-02-13 ENCOUNTER — Ambulatory Visit
Admission: EM | Admit: 2019-02-13 | Discharge: 2019-02-13 | Disposition: A | Discharge status: Home / Self Care | Payer: Medicare HMO | Attending: Family Medicine | Admitting: Family Medicine

## 2019-02-13 DIAGNOSIS — H5789 Other specified disorders of eye and adnexa: Secondary | ICD-10-CM | POA: Diagnosis not present

## 2019-02-13 MED ORDER — ERYTHROMYCIN 5 MG/GM OP OINT: TOPICAL_OINTMENT | OPHTHALMIC | 0 refills | Status: DC

## 2019-02-13 NOTE — ED Provider Notes (Signed)
[facility]    CSN: [id number] Arrival date & time: 02/13/19  1145      History   Chief Complaint Chief Complaint  Patient presents with  . Eye Pain    HPI Jacob Chapman is a 73 y.o. male history of prostate cancer, hypertension, hyperlipidemia, DM type II, presenting today for evaluation of right eye irritation.  Patient states that over the past week he has felt a foreign body sensation in his right thigh.  Feels the sensation will occasionally move.  I currently it is in his upper medial aspect of his lower eyelid.  He denies any changes in vision.  Denies any watering or discharge.  Denies use of contacts.  Denies history of any eye issues.  He has tried some over-the-counter eyedrops without relief.  He has tried flushing his eye out at home.  He denies any incident where he was around flying debris.  HPI  Past Medical History:  Diagnosis Date  . Bulging lumbar disc    no current prob  . Chronic lower back pain    Hx - no current prob  . Diabetes mellitus, type 2 ([place])    type II  . History of gout stable -- last bout 3 yrs ago   no current prob  . HTN (hypertension)   . Hyperlipidemia   . Prostate cancer ([place]) 09/01/11   dx Adenocarcinoma    Patient Active Problem List   Diagnosis Date Noted  . Grief 02/26/2016  . Plantar fasciitis of right foot 02/26/2014  . Abscess or cellulitis, neck 11/09/2011  . Mass of right side of neck 11/09/2011  . Prostate cancer ([place]) 09/01/2011  . Lipoma of neck 09/12/2010  . Pain in limb 09/03/2009  . CHEST PAIN, UNSPECIFIED 05/02/2008  . OSTEOARTHRITIS 09/09/2007  . LOW BACK PAIN 09/09/2007  . Dyslipidemia 05/02/2007  . Gout 05/02/2007  . Essential hypertension 05/02/2007  . PARESTHESIA 05/02/2007  . DM2 (diabetes mellitus, type 2) ([place]) 04/25/2007    Past Surgical History:  Procedure Laterality Date  . COLONOSCOPY  2005, 2017  . CYSTOSCOPY  11/06/2011   Procedure: CYSTOSCOPY FLEXIBLE;  Surgeon: Claybon Jabs, MD;  Location: [facility];  Service: Urology;  Laterality: N/A;  no seeds found in bladder  . PROSTATE BIOPSY  09/01/2011   DR OTTELIN'S OFFICE   gleason 3+3=6,volume=21cc,PSA=6.73  . RADIOACTIVE SEED IMPLANT  11/06/2011   Procedure: RADIOACTIVE SEED IMPLANT;  Surgeon: Claybon Jabs, MD;  Location: [facility];  Service: Urology;  Laterality: Bilateral;  85 seeds implanted  . VASECTOMY         Home Medications    Prior to Admission medications   Medication Sig Start Date End Date Taking? Authorizing Provider  Accu-Chek Softclix Lancets lancets Use to check blood sugar daily. DX:E11.9 07/12/18   Plotnikov, Evie Lacks, MD  acyclovir (ZOVIRAX) 400 MG tablet TAKE 1 TABLET  3 TIMES DAILY FOR COLD SORES 08/11/17   Plotnikov, Evie Lacks, MD  amLODipine (NORVASC) 5 MG tablet TAKE 1 AND 1/2 TABLETS EVERY DAY 07/21/18   Plotnikov, Evie Lacks, MD  aspirin 81 MG EC tablet Take 81 mg by mouth daily.     [provider]  Blood Glucose Calibration (ACCU-CHEK AVIVA) SOLN Use for blood sugar machine, DX: E11.9 07/12/18   Plotnikov, Evie Lacks, MD  Blood Glucose Monitoring Suppl (ACCU-CHEK AVIVA PLUS) w/Device KIT TEST BLOOD SUGAR EVERY DAY 09/05/18   Plotnikov, Evie Lacks, MD  cholecalciferol (VITAMIN  D) 1000 UNITS tablet daily. 07/20/14   [provider]  Cyanocobalamin (VITAMIN B12 PO) daily. 07/20/14   [provider]  erythromycin ophthalmic ointment Place a 1/2 inch ribbon of ointment into the lower eyelid 4 times daily x 1 week 02/13/19   Wieters, Hallie C, PA-C  glimepiride (AMARYL) 2 MG tablet TAKE 2 TABLETS (4 MG TOTAL) BY MOUTH DAILY BEFORE BREAKFAST. 09/05/18   Plotnikov, Evie Lacks, MD  glucose blood (ACCU-CHEK AVIVA PLUS) test strip Use to check blood sugar daily. DX:E11.9 07/12/18   Plotnikov, Evie Lacks, MD  losartan (COZAAR) 100 MG tablet TAKE 1 TABLET EVERY DAY 11/20/18   Plotnikov, Evie Lacks, MD  lovastatin (MEVACOR) 20 MG tablet TAKE 1 TABLET  AT BEDTIME 09/05/18   Plotnikov, Evie Lacks, MD  metaxalone (SKELAXIN) 800 MG tablet Take 1 tablet (800 mg total) by mouth 3 (three) times daily as needed. 06/26/14   Plotnikov, Evie Lacks, MD  pioglitazone (ACTOS) 45 MG tablet TAKE 1 TABLET EVERY DAY 09/05/18   Plotnikov, Evie Lacks, MD  vitamin B-12 (CYANOCOBALAMIN) 500 MCG tablet Take 500 mcg by mouth daily.     [provider]    Family History Family History  Adopted: Yes  Problem Relation Age of Onset  . Stroke Mother        71  . Cancer Father 58       lung ca  . Heart disease Sister 21       rhematic fever  . Hypertension Other 66       maternal 1st cousin,leukemia deceased  . Colon cancer Neg Hx   . Colon polyps Neg Hx   . Esophageal cancer Neg Hx   . Rectal cancer Neg Hx   . Stomach cancer Neg Hx     Social History Social History   Tobacco Use  . Smoking status: Never Smoker  . Smokeless tobacco: Never Used  Substance Use Topics  . Alcohol use: No    Alcohol/week: 0.0 standard drinks  . Drug use: No     Allergies   Metformin   Review of Systems Review of Systems  Constitutional: Negative for activity change, appetite change, chills, fatigue and fever.  HENT: Negative for congestion, ear pain, rhinorrhea, sinus pressure, sore throat and trouble swallowing.   Eyes: Positive for itching. Negative for discharge and redness.  Respiratory: Negative for cough, chest tightness and shortness of breath.   Cardiovascular: Negative for chest pain.  Gastrointestinal: Negative for abdominal pain, diarrhea, nausea and vomiting.  Musculoskeletal: Negative for myalgias.  Skin: Negative for rash.  Neurological: Negative for dizziness, light-headedness and headaches.     Physical Exam Triage Vital Signs ED Triage Vitals  Enc Vitals Group     BP 02/13/19 1200 127/72     Pulse Rate 02/13/19 1200 60     Resp 02/13/19 1200 18     Temp 02/13/19 1200 97.7 F (36.5 C)     Temp Source 02/13/19 1200 Oral     SpO2  02/13/19 1200 96 %     Weight --      Height --      Head Circumference --      Peak Flow --      Pain Score 02/13/19 1201 0     Pain Loc --      Pain Edu? --      Excl. in [place]? --    No data found.  Updated Vital Signs BP 127/72 (BP Location: Left Arm)  Pulse 60   Temp 97.7 F (36.5 C) (Oral)   Resp 18   SpO2 96%   Visual Acuity Right Eye Distance: 20/40 Left Eye Distance: 20/20 Bilateral Distance: 20/25  Right Eye Near:   Left Eye Near:    Bilateral Near:     Physical Exam Vitals signs and nursing note reviewed.  Constitutional:      Appearance: He is well-developed.     Comments: No acute distress  HENT:     Head: Normocephalic and atraumatic.     Nose: Nose normal.  Eyes:     Extraocular Movements: Extraocular movements intact.     Conjunctiva/sclera: Conjunctivae normal.     Pupils: Pupils are equal, round, and reactive to light.     Comments: No photophobia with exam, no conjunctival erythema, anterior chamber clear, no fluorescein uptake on exam, no sign of abrasion or ulcer.  No foreign body visualized to the naked eye.   Neck:     Musculoskeletal: Neck supple.  Cardiovascular:     Rate and Rhythm: Normal rate.  Pulmonary:     Effort: Pulmonary effort is normal. No respiratory distress.  Abdominal:     General: There is no distension.  Musculoskeletal: Normal range of motion.  Skin:    General: Skin is warm and dry.  Neurological:     Mental Status: He is alert and oriented to person, place, and time.      UC Treatments / Results  Labs (all labs ordered are listed, but only abnormal results are displayed) Labs Reviewed - No data to display  EKG   Radiology No results found.  Procedures Procedures (including critical care time)  Medications Ordered in UC Medications - No data to display  Initial Impression / Assessment and Plan / UC Course  I have reviewed the triage vital signs and the nursing notes.  Pertinent labs & imaging  results that were available during my care of the patient were reviewed by me and considered in my medical decision making (see chart for details).     Eye exam relatively normal as the visual acuity slightly decreased.  Without significant eye pain.  Rinsed eye out with eyewash.  Will place on erythromycin ointment to provide some lubrication possibly trapping a small piece of debris.  Recommending following up with ophthalmology if symptoms persisting.  Symptoms seem less likely infectious at this time.  Continue to monitor,Discussed strict return precautions. Patient verbalized understanding and is agreeable with plan.  Final Clinical Impressions(s) / UC Diagnoses   Final diagnoses:  Eye irritation     Discharge Instructions     No scratch or ulcer seen on eye No sign of foreign body May try eye wash again at home Erythromycin ointment 4 times daily  Follow up with ophthalmology if symptoms persisting   ED Prescriptions    Medication Sig Dispense Auth. Provider   erythromycin ophthalmic ointment Place a 1/2 inch ribbon of ointment into the lower eyelid 4 times daily x 1 week 3.5 g Wieters, [place] C, PA-C     PDMP not reviewed this encounter.   Janith Lima, [place] 02/13/19 [phone]

## 2019-02-13 NOTE — Discharge Instructions (Signed)
No scratch or ulcer seen on eye No sign of foreign body May try eye wash again at home Erythromycin ointment 4 times daily  Follow up with ophthalmology if symptoms persisting

## 2019-02-13 NOTE — ED Triage Notes (Signed)
Pt c/o something in rt eye x1wk; states has flushed it multiple times with no relief

## 2019-02-21 DIAGNOSIS — T1511XA Foreign body in conjunctival sac, right eye, initial encounter: Secondary | ICD-10-CM | POA: Diagnosis not present

## 2019-02-28 DIAGNOSIS — H5711 Ocular pain, right eye: Secondary | ICD-10-CM | POA: Diagnosis not present

## 2019-03-20 DIAGNOSIS — C61 Malignant neoplasm of prostate: Secondary | ICD-10-CM | POA: Diagnosis not present

## 2019-04-04 DIAGNOSIS — N400 Enlarged prostate without lower urinary tract symptoms: Secondary | ICD-10-CM | POA: Diagnosis not present

## 2019-04-04 DIAGNOSIS — Z8546 Personal history of malignant neoplasm of prostate: Secondary | ICD-10-CM | POA: Diagnosis not present

## 2019-04-19 ENCOUNTER — Other Ambulatory Visit: Payer: Self-pay | Admitting: Internal Medicine

## 2019-04-26 ENCOUNTER — Other Ambulatory Visit: Payer: Self-pay | Admitting: Internal Medicine

## 2019-05-29 ENCOUNTER — Other Ambulatory Visit (INDEPENDENT_AMBULATORY_CARE_PROVIDER_SITE_OTHER): Payer: Medicare HMO

## 2019-05-29 DIAGNOSIS — I10 Essential (primary) hypertension: Secondary | ICD-10-CM

## 2019-05-29 DIAGNOSIS — R209 Unspecified disturbances of skin sensation: Secondary | ICD-10-CM | POA: Diagnosis not present

## 2019-05-29 DIAGNOSIS — M109 Gout, unspecified: Secondary | ICD-10-CM

## 2019-05-29 DIAGNOSIS — E785 Hyperlipidemia, unspecified: Secondary | ICD-10-CM | POA: Diagnosis not present

## 2019-05-29 DIAGNOSIS — E119 Type 2 diabetes mellitus without complications: Secondary | ICD-10-CM

## 2019-05-29 LAB — HEMOGLOBIN A1C: Hgb A1c MFr Bld: 7 % — ABNORMAL HIGH (ref 4.6–6.5)

## 2019-05-29 LAB — BASIC METABOLIC PANEL
BUN: 15 mg/dL (ref 6–23)
CO2: 26 mEq/L (ref 19–32)
Calcium: 9.1 mg/dL (ref 8.4–10.5)
Chloride: 107 mEq/L (ref 96–112)
Creatinine, Ser: 0.99 mg/dL (ref 0.40–1.50)
GFR: 74.45 mL/min (ref >60.00)
Glucose, Bld: 146 mg/dL — ABNORMAL HIGH (ref 70–99)
Potassium: 4.7 mEq/L (ref 3.5–5.1)
Sodium: 140 mEq/L (ref 135–145)

## 2019-05-29 LAB — LIPID PANEL
Cholesterol: 124 mg/dL (ref 0–200)
HDL: 43.8 mg/dL (ref >39.00)
LDL Cholesterol: 67 mg/dL (ref 0–99)
NonHDL: 80.02
Total CHOL/HDL Ratio: 3
Triglycerides: 67 mg/dL (ref 0.0–149.0)
VLDL: 13.4 mg/dL (ref 0.0–40.0)

## 2019-05-30 ENCOUNTER — Ambulatory Visit: Payer: Self-pay | Admitting: Internal Medicine

## 2019-06-06 ENCOUNTER — Ambulatory Visit (INDEPENDENT_AMBULATORY_CARE_PROVIDER_SITE_OTHER): Payer: Medicare HMO | Admitting: Internal Medicine

## 2019-06-06 ENCOUNTER — Other Ambulatory Visit: Payer: Self-pay

## 2019-06-06 ENCOUNTER — Encounter: Payer: Self-pay | Admitting: Internal Medicine

## 2019-06-06 DIAGNOSIS — E119 Type 2 diabetes mellitus without complications: Secondary | ICD-10-CM

## 2019-06-06 DIAGNOSIS — M1000 Idiopathic gout, unspecified site: Secondary | ICD-10-CM | POA: Diagnosis not present

## 2019-06-06 DIAGNOSIS — C61 Malignant neoplasm of prostate: Secondary | ICD-10-CM | POA: Diagnosis not present

## 2019-06-06 DIAGNOSIS — E785 Hyperlipidemia, unspecified: Secondary | ICD-10-CM

## 2019-06-06 NOTE — Progress Notes (Signed)
Subjective:  Patient ID: Jacob Chapman, male    DOB: 1946/03/12  Age: 74 y.o. MRN: [id number]  CC: No chief complaint on file.   HPI Jacob Chapman presents for DM, HTN, B12 def  Outpatient Medications Prior to Visit  Medication Sig Dispense Refill  . ACCU-CHEK AVIVA PLUS test strip TEST BLOOD SUGAR ONE TIME DAILY 100 strip 5  . Accu-Chek Softclix Lancets lancets TEST BLOOD SUGAR EVERY DAY 100 each 3  . acyclovir (ZOVIRAX) 400 MG tablet TAKE 1 TABLET  3 TIMES DAILY FOR COLD SORES 21 tablet 3  . amLODipine (NORVASC) 5 MG tablet TAKE 1 AND 1/2 TABLETS EVERY DAY 135 tablet 2  . aspirin 81 MG EC tablet Take 81 mg by mouth daily.     . Blood Glucose Calibration (ACCU-CHEK AVIVA) SOLN Use for blood sugar machine, DX: E11.9 1 each 0  . Blood Glucose Monitoring Suppl (ACCU-CHEK AVIVA PLUS) w/Device KIT TEST BLOOD SUGAR EVERY DAY 1 kit 0  . cholecalciferol (VITAMIN D) 1000 UNITS tablet daily.    . Cyanocobalamin (VITAMIN B12 PO) daily.    Marland Kitchen erythromycin ophthalmic ointment Place a 1/2 inch ribbon of ointment into the lower eyelid 4 times daily x 1 week 3.5 g 0  . glimepiride (AMARYL) 2 MG tablet TAKE 2 TABLETS (4 MG TOTAL) BY MOUTH DAILY BEFORE BREAKFAST. 180 tablet 3  . losartan (COZAAR) 100 MG tablet TAKE 1 TABLET EVERY DAY 90 tablet 3  . lovastatin (MEVACOR) 20 MG tablet TAKE 1 TABLET AT BEDTIME 90 tablet 3  . metaxalone (SKELAXIN) 800 MG tablet Take 1 tablet (800 mg total) by mouth 3 (three) times daily as needed. 100 tablet 1  . pioglitazone (ACTOS) 45 MG tablet TAKE 1 TABLET EVERY DAY 90 tablet 3  . vitamin B-12 (CYANOCOBALAMIN) 500 MCG tablet Take 500 mcg by mouth daily.     Marland Kitchen neomycin-polymyxin b-dexamethasone (MAXITROL) 3.5-10000-0.1 SUSP      No facility-administered medications prior to visit.    ROS: Review of Systems  Constitutional: Negative for appetite change, fatigue and unexpected weight change.  HENT: Negative for congestion, nosebleeds, sneezing, sore throat and  trouble swallowing.   Eyes: Negative for itching and visual disturbance.  Respiratory: Negative for cough.   Cardiovascular: Negative for chest pain, palpitations and leg swelling.  Gastrointestinal: Negative for abdominal distention, blood in stool, diarrhea and nausea.  Genitourinary: Negative for frequency and hematuria.  Musculoskeletal: Negative for back pain, gait problem, joint swelling and neck pain.  Skin: Negative for rash.  Neurological: Negative for dizziness, tremors, speech difficulty and weakness.  Psychiatric/Behavioral: Negative for agitation, dysphoric mood, sleep disturbance and suicidal ideas. The patient is not nervous/anxious.     Objective:  BP 112/72 (BP Location: Left Arm, Patient Position: Sitting, Cuff Size: Normal)   Pulse (!) 58   Temp 97.9 F (36.6 C) (Oral)   Ht 5' 6"  (1.676 m)   Wt 167 lb 8 oz (76 kg)   SpO2 97%   BMI 27.04 kg/m   BP Readings from Last 3 Encounters:  06/06/19 112/72  02/13/19 127/72  01/24/19 118/76    Wt Readings from Last 3 Encounters:  06/06/19 167 lb 8 oz (76 kg)  01/24/19 164 lb (74.4 kg)  01/24/19 164 lb (74.4 kg)    Physical Exam Constitutional:      General: He is not in acute distress.    Appearance: He is well-developed.     Comments: NAD  Eyes:     Conjunctiva/sclera:  Conjunctivae normal.     Pupils: Pupils are equal, round, and reactive to light.  Neck:     Thyroid: No thyromegaly.     Vascular: No JVD.  Cardiovascular:     Rate and Rhythm: Normal rate and regular rhythm.     Heart sounds: Normal heart sounds. No murmur. No friction rub. No gallop.   Pulmonary:     Effort: Pulmonary effort is normal. No respiratory distress.     Breath sounds: Normal breath sounds. No wheezing or rales.  Chest:     Chest wall: No tenderness.  Abdominal:     General: Bowel sounds are normal. There is no distension.     Palpations: Abdomen is soft. There is no mass.     Tenderness: There is no abdominal tenderness.  There is no guarding or rebound.  Musculoskeletal:        General: No tenderness. Normal range of motion.     Cervical back: Normal range of motion.  Lymphadenopathy:     Cervical: No cervical adenopathy.  Skin:    General: Skin is warm and dry.     Findings: No rash.  Neurological:     Mental Status: He is alert and oriented to person, place, and time.     Cranial Nerves: No cranial nerve deficit.     Motor: No abnormal muscle tone.     Coordination: Coordination normal.     Gait: Gait normal.     Deep Tendon Reflexes: Reflexes are normal and symmetric.  Psychiatric:        Behavior: Behavior normal.        Thought Content: Thought content normal.        Judgment: Judgment normal.     Lab Results  Component Value Date   WBC 8.3 09/20/2014   HGB 14.4 09/20/2014   HCT 43.4 (A) 09/20/2014   PLT 180 10/30/2011   GLUCOSE 146 (H) 05/29/2019   CHOL 124 05/29/2019   TRIG 67.0 05/29/2019   HDL 43.80 05/29/2019   LDLCALC 67 05/29/2019   ALT 15 01/17/2019   AST 16 01/17/2019   NA 140 05/29/2019   K 4.7 05/29/2019   CL 107 05/29/2019   CREATININE 0.99 05/29/2019   BUN 15 05/29/2019   CO2 26 05/29/2019   TSH 2.115 09/20/2014   PSA 0.3 01/30/2016   INR CANCELED 01/18/2014   HGBA1C 7.0 (H) 05/29/2019    No results found.  Assessment & Plan:    Walker Kehr, MD

## 2019-06-06 NOTE — Assessment & Plan Note (Signed)
No relapse 

## 2019-06-06 NOTE — Assessment & Plan Note (Signed)
A little worse Increase activity, improve diet

## 2019-06-06 NOTE — Assessment & Plan Note (Signed)
Lovastatin 

## 2019-06-06 NOTE — Assessment & Plan Note (Signed)
F/u w/Dr Karsten Ro PSA q 6 mo w/Urology

## 2019-06-19 ENCOUNTER — Other Ambulatory Visit: Payer: Self-pay | Admitting: Internal Medicine

## 2019-08-14 ENCOUNTER — Other Ambulatory Visit (INDEPENDENT_AMBULATORY_CARE_PROVIDER_SITE_OTHER): Payer: Medicare HMO

## 2019-08-14 DIAGNOSIS — R209 Unspecified disturbances of skin sensation: Secondary | ICD-10-CM

## 2019-08-14 DIAGNOSIS — M109 Gout, unspecified: Secondary | ICD-10-CM

## 2019-08-14 DIAGNOSIS — I10 Essential (primary) hypertension: Secondary | ICD-10-CM

## 2019-08-14 DIAGNOSIS — E119 Type 2 diabetes mellitus without complications: Secondary | ICD-10-CM

## 2019-08-14 DIAGNOSIS — E785 Hyperlipidemia, unspecified: Secondary | ICD-10-CM | POA: Diagnosis not present

## 2019-08-14 LAB — BASIC METABOLIC PANEL
BUN: 18 mg/dL (ref 6–23)
CO2: 27 mEq/L (ref 19–32)
Calcium: 9 mg/dL (ref 8.4–10.5)
Chloride: 105 mEq/L (ref 96–112)
Creatinine, Ser: 0.94 mg/dL (ref 0.40–1.50)
GFR: 78.99 mL/min (ref >60.00)
Glucose, Bld: 112 mg/dL — ABNORMAL HIGH (ref 70–99)
Potassium: 4.3 mEq/L (ref 3.5–5.1)
Sodium: 139 mEq/L (ref 135–145)

## 2019-08-14 LAB — LIPID PANEL
Cholesterol: 116 mg/dL (ref 0–200)
HDL: 37.7 mg/dL — ABNORMAL LOW (ref >39.00)
LDL Cholesterol: 67 mg/dL (ref 0–99)
NonHDL: 78.15
Total CHOL/HDL Ratio: 3
Triglycerides: 58 mg/dL (ref 0.0–149.0)
VLDL: 11.6 mg/dL (ref 0.0–40.0)

## 2019-08-14 LAB — HEMOGLOBIN A1C: Hgb A1c MFr Bld: 6.6 % — ABNORMAL HIGH (ref 4.6–6.5)

## 2019-08-22 ENCOUNTER — Other Ambulatory Visit: Payer: Self-pay

## 2019-08-22 ENCOUNTER — Encounter: Payer: Self-pay | Admitting: Internal Medicine

## 2019-08-22 ENCOUNTER — Ambulatory Visit (INDEPENDENT_AMBULATORY_CARE_PROVIDER_SITE_OTHER): Payer: Medicare HMO | Admitting: Internal Medicine

## 2019-08-22 DIAGNOSIS — E119 Type 2 diabetes mellitus without complications: Secondary | ICD-10-CM

## 2019-08-22 DIAGNOSIS — I10 Essential (primary) hypertension: Secondary | ICD-10-CM

## 2019-08-22 DIAGNOSIS — E785 Hyperlipidemia, unspecified: Secondary | ICD-10-CM | POA: Diagnosis not present

## 2019-08-22 DIAGNOSIS — R600 Localized edema: Secondary | ICD-10-CM | POA: Diagnosis not present

## 2019-08-22 NOTE — Assessment & Plan Note (Signed)
On Amlodipine and Losartan 

## 2019-08-22 NOTE — Progress Notes (Signed)
Subjective:  Patient ID: Jacob Chapman, male    DOB: 21-Jul-1945  Age: 74 y.o. MRN: [id number]  CC: No chief complaint on file.   HPI Jacob Chapman presents for HTN, DM, dyslipidemia f/u C/o off and on LLE swelling x 6 months  Outpatient Medications Prior to Visit  Medication Sig Dispense Refill  . ACCU-CHEK AVIVA PLUS test strip TEST BLOOD SUGAR ONE TIME DAILY 100 strip 5  . Accu-Chek Softclix Lancets lancets TEST BLOOD SUGAR EVERY DAY 100 each 3  . acyclovir (ZOVIRAX) 400 MG tablet TAKE 1 TABLET  3 TIMES DAILY FOR COLD SORES 21 tablet 3  . amLODipine (NORVASC) 5 MG tablet TAKE 1 AND 1/2 TABLETS EVERY DAY 135 tablet 2  . aspirin 81 MG EC tablet Take 81 mg by mouth daily.     . Blood Glucose Calibration (ACCU-CHEK AVIVA) SOLN Use for blood sugar machine, DX: E11.9 1 each 0  . Blood Glucose Monitoring Suppl (ACCU-CHEK AVIVA PLUS) w/Device KIT TEST BLOOD SUGAR EVERY DAY 1 kit 0  . cholecalciferol (VITAMIN D) 1000 UNITS tablet daily.    . Cyanocobalamin (VITAMIN B12 PO) daily.    Marland Kitchen erythromycin ophthalmic ointment Place a 1/2 inch ribbon of ointment into the lower eyelid 4 times daily x 1 week 3.5 g 0  . glimepiride (AMARYL) 2 MG tablet TAKE 2 TABLETS (4 MG TOTAL) BY MOUTH DAILY BEFORE BREAKFAST. 180 tablet 3  . losartan (COZAAR) 100 MG tablet TAKE 1 TABLET EVERY DAY 90 tablet 3  . lovastatin (MEVACOR) 20 MG tablet TAKE 1 TABLET AT BEDTIME 90 tablet 3  . metaxalone (SKELAXIN) 800 MG tablet Take 1 tablet (800 mg total) by mouth 3 (three) times daily as needed. 100 tablet 1  . neomycin-polymyxin b-dexamethasone (MAXITROL) 3.5-10000-0.1 SUSP     . pioglitazone (ACTOS) 45 MG tablet TAKE 1 TABLET EVERY DAY 90 tablet 3  . vitamin B-12 (CYANOCOBALAMIN) 500 MCG tablet Take 500 mcg by mouth daily.      No facility-administered medications prior to visit.    ROS: Review of Systems  Constitutional: Negative for appetite change, fatigue and unexpected weight change.  HENT: Negative for  congestion, nosebleeds, sneezing, sore throat and trouble swallowing.   Eyes: Negative for itching and visual disturbance.  Respiratory: Negative for cough.   Cardiovascular: Negative for chest pain, palpitations and leg swelling.  Gastrointestinal: Negative for abdominal distention, blood in stool, diarrhea and nausea.  Genitourinary: Negative for frequency and hematuria.  Musculoskeletal: Negative for back pain, gait problem, joint swelling and neck pain.  Skin: Negative for rash.  Neurological: Negative for dizziness, tremors, speech difficulty and weakness.  Psychiatric/Behavioral: Negative for agitation, dysphoric mood and sleep disturbance. The patient is not nervous/anxious.     Objective:  BP 116/72 (BP Location: Left Arm, Patient Position: Sitting, Cuff Size: Normal)   Pulse (!) 58   Temp 98 F (36.7 C) (Oral)   Ht 5' 6" (1.676 m)   Wt 165 lb (74.8 kg)   SpO2 94%   BMI 26.63 kg/m   BP Readings from Last 3 Encounters:  08/22/19 116/72  06/06/19 112/72  02/13/19 127/72    Wt Readings from Last 3 Encounters:  08/22/19 165 lb (74.8 kg)  06/06/19 167 lb 8 oz (76 kg)  01/24/19 164 lb (74.4 kg)    Physical Exam Constitutional:      General: He is not in acute distress.    Appearance: He is well-developed.     Comments: NAD  Eyes:  Conjunctiva/sclera: Conjunctivae normal.     Pupils: Pupils are equal, round, and reactive to light.  Neck:     Thyroid: No thyromegaly.     Vascular: No JVD.  Cardiovascular:     Rate and Rhythm: Normal rate and regular rhythm.     Heart sounds: Normal heart sounds. No murmur. No friction rub. No gallop.   Pulmonary:     Effort: Pulmonary effort is normal. No respiratory distress.     Breath sounds: Normal breath sounds. No wheezing or rales.  Chest:     Chest wall: No tenderness.  Abdominal:     General: Bowel sounds are normal. There is no distension.     Palpations: Abdomen is soft. There is no mass.     Tenderness: There is  no abdominal tenderness. There is no guarding or rebound.  Musculoskeletal:        General: No tenderness. Normal range of motion.     Cervical back: Normal range of motion.  Lymphadenopathy:     Cervical: No cervical adenopathy.  Skin:    General: Skin is warm and dry.     Findings: No rash.  Neurological:     Mental Status: He is alert and oriented to person, place, and time.     Cranial Nerves: No cranial nerve deficit.     Motor: No abnormal muscle tone.     Coordination: Coordination normal.     Gait: Gait normal.     Deep Tendon Reflexes: Reflexes are normal and symmetric.  Psychiatric:        Behavior: Behavior normal.        Thought Content: Thought content normal.        Judgment: Judgment normal.   LLE w/trace edema, NT  Lab Results  Component Value Date   WBC 8.3 09/20/2014   HGB 14.4 09/20/2014   HCT 43.4 (A) 09/20/2014   PLT 180 10/30/2011   GLUCOSE 112 (H) 08/14/2019   CHOL 116 08/14/2019   TRIG 58.0 08/14/2019   HDL 37.70 (L) 08/14/2019   LDLCALC 67 08/14/2019   ALT 15 01/17/2019   AST 16 01/17/2019   NA 139 08/14/2019   K 4.3 08/14/2019   CL 105 08/14/2019   CREATININE 0.94 08/14/2019   BUN 18 08/14/2019   CO2 27 08/14/2019   TSH 2.115 09/20/2014   PSA 0.3 01/30/2016   INR CANCELED 01/18/2014   HGBA1C 6.6 (H) 08/14/2019    No results found.  Assessment & Plan:    Walker Kehr, MD

## 2019-08-22 NOTE — Patient Instructions (Signed)
Compression knee highs for travel

## 2019-08-22 NOTE — Assessment & Plan Note (Signed)
On Actos and Amaryl 

## 2019-08-22 NOTE — Assessment & Plan Note (Addendum)
Lovasrtatin On Actos and Amaryl The pt was asked to get vaccinated for COVID 19

## 2019-08-22 NOTE — Assessment & Plan Note (Signed)
Use compression knee highs for travel

## 2019-08-30 ENCOUNTER — Other Ambulatory Visit: Payer: Self-pay | Admitting: Internal Medicine

## 2019-09-04 DIAGNOSIS — E1136 Type 2 diabetes mellitus with diabetic cataract: Secondary | ICD-10-CM | POA: Diagnosis not present

## 2019-09-04 DIAGNOSIS — H2513 Age-related nuclear cataract, bilateral: Secondary | ICD-10-CM | POA: Diagnosis not present

## 2019-09-04 DIAGNOSIS — H25013 Cortical age-related cataract, bilateral: Secondary | ICD-10-CM | POA: Diagnosis not present

## 2019-09-04 DIAGNOSIS — E119 Type 2 diabetes mellitus without complications: Secondary | ICD-10-CM | POA: Diagnosis not present

## 2019-09-04 LAB — HM DIABETES EYE EXAM

## 2019-09-06 ENCOUNTER — Encounter: Payer: Self-pay | Admitting: Internal Medicine

## 2019-10-02 DIAGNOSIS — C61 Malignant neoplasm of prostate: Secondary | ICD-10-CM | POA: Diagnosis not present

## 2019-10-09 DIAGNOSIS — L570 Actinic keratosis: Secondary | ICD-10-CM | POA: Diagnosis not present

## 2019-10-09 DIAGNOSIS — L821 Other seborrheic keratosis: Secondary | ICD-10-CM | POA: Diagnosis not present

## 2019-10-09 DIAGNOSIS — L301 Dyshidrosis [pompholyx]: Secondary | ICD-10-CM | POA: Diagnosis not present

## 2019-10-09 DIAGNOSIS — L720 Epidermal cyst: Secondary | ICD-10-CM | POA: Diagnosis not present

## 2019-10-09 DIAGNOSIS — D1801 Hemangioma of skin and subcutaneous tissue: Secondary | ICD-10-CM | POA: Diagnosis not present

## 2019-10-09 DIAGNOSIS — L814 Other melanin hyperpigmentation: Secondary | ICD-10-CM | POA: Diagnosis not present

## 2019-10-09 DIAGNOSIS — D225 Melanocytic nevi of trunk: Secondary | ICD-10-CM | POA: Diagnosis not present

## 2019-10-10 DIAGNOSIS — C61 Malignant neoplasm of prostate: Secondary | ICD-10-CM | POA: Diagnosis not present

## 2019-11-14 ENCOUNTER — Other Ambulatory Visit (INDEPENDENT_AMBULATORY_CARE_PROVIDER_SITE_OTHER): Payer: Medicare HMO

## 2019-11-14 DIAGNOSIS — M109 Gout, unspecified: Secondary | ICD-10-CM | POA: Diagnosis not present

## 2019-11-14 DIAGNOSIS — I10 Essential (primary) hypertension: Secondary | ICD-10-CM | POA: Diagnosis not present

## 2019-11-14 DIAGNOSIS — E119 Type 2 diabetes mellitus without complications: Secondary | ICD-10-CM

## 2019-11-14 DIAGNOSIS — R209 Unspecified disturbances of skin sensation: Secondary | ICD-10-CM | POA: Diagnosis not present

## 2019-11-14 DIAGNOSIS — E785 Hyperlipidemia, unspecified: Secondary | ICD-10-CM

## 2019-11-14 LAB — LIPID PANEL
Cholesterol: 114 mg/dL (ref 0–200)
HDL: 40 mg/dL (ref >39.00)
LDL Cholesterol: 61 mg/dL (ref 0–99)
NonHDL: 74.49
Total CHOL/HDL Ratio: 3
Triglycerides: 65 mg/dL (ref 0.0–149.0)
VLDL: 13 mg/dL (ref 0.0–40.0)

## 2019-11-14 LAB — BASIC METABOLIC PANEL
BUN: 15 mg/dL (ref 6–23)
CO2: 25 mEq/L (ref 19–32)
Calcium: 8.7 mg/dL (ref 8.4–10.5)
Chloride: 109 mEq/L (ref 96–112)
Creatinine, Ser: 0.96 mg/dL (ref 0.40–1.50)
GFR: 77.04 mL/min (ref >60.00)
Glucose, Bld: 120 mg/dL — ABNORMAL HIGH (ref 70–99)
Potassium: 3.9 mEq/L (ref 3.5–5.1)
Sodium: 139 mEq/L (ref 135–145)

## 2019-11-14 LAB — HEPATIC FUNCTION PANEL
ALT: 17 U/L (ref 0–53)
AST: 16 U/L (ref 0–37)
Albumin: 3.7 g/dL (ref 3.5–5.2)
Alkaline Phosphatase: 61 U/L (ref 39–117)
Bilirubin, Direct: 0.1 mg/dL (ref 0.0–0.3)
Total Bilirubin: 0.5 mg/dL (ref 0.2–1.2)
Total Protein: 6.2 g/dL (ref 6.0–8.3)

## 2019-11-14 LAB — HEMOGLOBIN A1C: Hgb A1c MFr Bld: 6.7 % — ABNORMAL HIGH (ref 4.6–6.5)

## 2019-11-15 ENCOUNTER — Other Ambulatory Visit: Payer: Self-pay | Admitting: Internal Medicine

## 2019-11-21 ENCOUNTER — Ambulatory Visit (INDEPENDENT_AMBULATORY_CARE_PROVIDER_SITE_OTHER): Payer: Medicare HMO | Admitting: Internal Medicine

## 2019-11-21 ENCOUNTER — Other Ambulatory Visit: Payer: Self-pay

## 2019-11-21 ENCOUNTER — Encounter: Payer: Self-pay | Admitting: Internal Medicine

## 2019-11-21 DIAGNOSIS — E119 Type 2 diabetes mellitus without complications: Secondary | ICD-10-CM | POA: Diagnosis not present

## 2019-11-21 DIAGNOSIS — I10 Essential (primary) hypertension: Secondary | ICD-10-CM

## 2019-11-21 DIAGNOSIS — E785 Hyperlipidemia, unspecified: Secondary | ICD-10-CM

## 2019-11-21 NOTE — Progress Notes (Signed)
Subjective:  Patient ID: Jacob Chapman, male    DOB: 1945-07-12  Age: 74 y.o. MRN: [id number]  CC: Diabetes (3 month follow-up)   HPI Jacob Chapman presents for DM, HTN, dyslipidemia f/u  Outpatient Medications Prior to Visit  Medication Sig Dispense Refill  . ACCU-CHEK AVIVA PLUS test strip TEST BLOOD SUGAR ONE TIME DAILY 100 strip 5  . Accu-Chek Softclix Lancets lancets TEST BLOOD SUGAR EVERY DAY 100 each 3  . acyclovir (ZOVIRAX) 400 MG tablet TAKE 1 TABLET  3 TIMES DAILY FOR COLD SORES 21 tablet 3  . amLODipine (NORVASC) 5 MG tablet TAKE 1 AND 1/2 TABLETS EVERY DAY 135 tablet 2  . aspirin 81 MG EC tablet Take 81 mg by mouth daily.     . Blood Glucose Calibration (ACCU-CHEK AVIVA) SOLN Use for blood sugar machine, DX: E11.9 1 each 0  . Blood Glucose Monitoring Suppl (ACCU-CHEK AVIVA PLUS) w/Device KIT TEST BLOOD SUGAR EVERY DAY 1 kit 0  . cholecalciferol (VITAMIN D) 1000 UNITS tablet daily.    . Cyanocobalamin (VITAMIN B12 PO) daily.    Marland Kitchen erythromycin ophthalmic ointment Place a 1/2 inch ribbon of ointment into the lower eyelid 4 times daily x 1 week 3.5 g 0  . glimepiride (AMARYL) 2 MG tablet TAKE 2 TABLETS (4 MG TOTAL) BY MOUTH DAILY BEFORE BREAKFAST. 180 tablet 3  . losartan (COZAAR) 100 MG tablet TAKE 1 TABLET EVERY DAY 90 tablet 3  . lovastatin (MEVACOR) 20 MG tablet TAKE 1 TABLET AT BEDTIME 90 tablet 3  . metaxalone (SKELAXIN) 800 MG tablet Take 1 tablet (800 mg total) by mouth 3 (three) times daily as needed. 100 tablet 1  . neomycin-polymyxin b-dexamethasone (MAXITROL) 3.5-10000-0.1 SUSP     . pioglitazone (ACTOS) 45 MG tablet TAKE 1 TABLET EVERY DAY 90 tablet 3  . vitamin B-12 (CYANOCOBALAMIN) 500 MCG tablet Take 500 mcg by mouth daily.      No facility-administered medications prior to visit.    ROS: Review of Systems  Constitutional: Negative for appetite change, fatigue and unexpected weight change.  HENT: Negative for congestion, nosebleeds, sneezing, sore  throat and trouble swallowing.   Eyes: Negative for itching and visual disturbance.  Respiratory: Negative for cough.   Cardiovascular: Negative for chest pain, palpitations and leg swelling.  Gastrointestinal: Negative for abdominal distention, blood in stool, diarrhea and nausea.  Genitourinary: Negative for frequency and hematuria.  Musculoskeletal: Negative for back pain, gait problem, joint swelling and neck pain.  Skin: Negative for rash.  Neurological: Negative for dizziness, tremors, speech difficulty and weakness.  Psychiatric/Behavioral: Negative for agitation, dysphoric mood, sleep disturbance and suicidal ideas. The patient is not nervous/anxious.     Objective:  BP 122/70 (BP Location: Left Arm)   Pulse (!) 56   Temp 98.2 F (36.8 C) (Oral)   Wt 168 lb (76.2 kg)   SpO2 97%   BMI 27.12 kg/m   BP Readings from Last 3 Encounters:  11/21/19 122/70  08/22/19 116/72  06/06/19 112/72    Wt Readings from Last 3 Encounters:  11/21/19 168 lb (76.2 kg)  08/22/19 165 lb (74.8 kg)  06/06/19 167 lb 8 oz (76 kg)    Physical Exam Constitutional:      General: He is not in acute distress.    Appearance: He is well-developed.     Comments: NAD  Eyes:     Conjunctiva/sclera: Conjunctivae normal.     Pupils: Pupils are equal, round, and reactive to light.  Neck:     Thyroid: No thyromegaly.     Vascular: No JVD.  Cardiovascular:     Rate and Rhythm: Normal rate and regular rhythm.     Heart sounds: Normal heart sounds. No murmur heard.  No friction rub. No gallop.   Pulmonary:     Effort: Pulmonary effort is normal. No respiratory distress.     Breath sounds: Normal breath sounds. No wheezing or rales.  Chest:     Chest wall: No tenderness.  Abdominal:     General: Bowel sounds are normal. There is no distension.     Palpations: Abdomen is soft. There is no mass.     Tenderness: There is no abdominal tenderness. There is no guarding or rebound.  Musculoskeletal:          General: No tenderness. Normal range of motion.     Cervical back: Normal range of motion.  Lymphadenopathy:     Cervical: No cervical adenopathy.  Skin:    General: Skin is warm and dry.     Findings: No rash.  Neurological:     Mental Status: He is alert and oriented to person, place, and time.     Cranial Nerves: No cranial nerve deficit.     Motor: No abnormal muscle tone.     Coordination: Coordination normal.     Gait: Gait normal.     Deep Tendon Reflexes: Reflexes are normal and symmetric.  Psychiatric:        Behavior: Behavior normal.        Thought Content: Thought content normal.        Judgment: Judgment normal.     Lab Results  Component Value Date   WBC 8.3 09/20/2014   HGB 14.4 09/20/2014   HCT 43.4 (A) 09/20/2014   PLT 180 10/30/2011   GLUCOSE 120 (H) 11/14/2019   CHOL 114 11/14/2019   TRIG 65.0 11/14/2019   HDL 40.00 11/14/2019   LDLCALC 61 11/14/2019   ALT 17 11/14/2019   AST 16 11/14/2019   NA 139 11/14/2019   K 3.9 11/14/2019   CL 109 11/14/2019   CREATININE 0.96 11/14/2019   BUN 15 11/14/2019   CO2 25 11/14/2019   TSH 2.115 09/20/2014   PSA 0.3 01/30/2016   INR CANCELED 01/18/2014   HGBA1C 6.7 (H) 11/14/2019    No results found.  Assessment & Plan:    Walker Kehr, MD

## 2019-11-21 NOTE — Assessment & Plan Note (Signed)
On Amlodipine and Losartan 

## 2019-11-21 NOTE — Assessment & Plan Note (Signed)
Lovastatin 

## 2019-11-21 NOTE — Assessment & Plan Note (Signed)
On Actos and Amaryl 

## 2020-01-19 ENCOUNTER — Other Ambulatory Visit: Payer: Self-pay | Admitting: Internal Medicine

## 2020-01-29 ENCOUNTER — Ambulatory Visit (INDEPENDENT_AMBULATORY_CARE_PROVIDER_SITE_OTHER): Payer: Medicare HMO

## 2020-01-29 ENCOUNTER — Other Ambulatory Visit: Payer: Self-pay

## 2020-01-29 DIAGNOSIS — Z23 Encounter for immunization: Secondary | ICD-10-CM | POA: Diagnosis not present

## 2020-01-29 DIAGNOSIS — Z0000 Encounter for general adult medical examination without abnormal findings: Secondary | ICD-10-CM | POA: Diagnosis not present

## 2020-01-29 NOTE — Patient Instructions (Addendum)
Jacob Chapman , Thank you for taking time to come for your Medicare Wellness Visit. I appreciate your ongoing commitment to your health goals. Please review the following plan we discussed and let me know if I can assist you in the future.   Screening recommendations/referrals: Colonoscopy: 08/21/2015; due every 10 years Recommended yearly ophthalmology/optometry visit for glaucoma screening and checkup Recommended yearly dental visit for hygiene and checkup  Vaccinations: Influenza vaccine: 01/29/2020 Pneumococcal vaccine: completed Tdap vaccine: 10/24/2013; due every 10 years Shingles vaccine: never done; will check with pcp at next visit   Covid-19: completed  Advanced directives: Please bring a copy of your health care power of attorney and living will to the office at your convenience.  Conditions/risks identified: Yes; Reviewed health maintenance screenings with patient today and relevant education, vaccines, and/or referrals were provided. Please continue to do your personal lifestyle choices by: daily care of teeth and gums, regular physical activity (goal should be 5 days a week for 30 minutes), eat a healthy diet, avoid tobacco and drug use, limiting any alcohol intake, taking a low-dose aspirin (if not allergic or have been advised by your provider otherwise) and taking vitamins and minerals as recommended by your provider. Continue doing brain stimulating activities (puzzles, reading, adult coloring books, staying active) to keep memory sharp. Continue to eat heart healthy diet (full of fruits, vegetables, whole grains, lean protein, water--limit salt, fat, and sugar intake) and increase physical activity as tolerated.  Next appointment: Please schedule your next Medicare Wellness Visit with your Nurse Health Advisor in 1 year by calling [phone]. Preventive Care 72 Years and Older, Male Preventive care refers to lifestyle choices and visits with your health care provider that can  promote health and wellness. What does preventive care include?  A yearly physical exam. This is also called an annual well check.  Dental exams once or twice a year.  Routine eye exams. Ask your health care provider how often you should have your eyes checked.  Personal lifestyle choices, including:  Daily care of your teeth and gums.  Regular physical activity.  Eating a healthy diet.  Avoiding tobacco and drug use.  Limiting alcohol use.  Practicing safe sex.  Taking low doses of aspirin every day.  Taking vitamin and mineral supplements as recommended by your health care provider. What happens during an annual well check? The services and screenings done by your health care provider during your annual well check will depend on your age, overall health, lifestyle risk factors, and family history of disease. Counseling  Your health care provider may ask you questions about your:  Alcohol use.  Tobacco use.  Drug use.  Emotional well-being.  Home and relationship well-being.  Sexual activity.  Eating habits.  History of falls.  Memory and ability to understand (cognition).  Work and work Statistician. Screening  You may have the following tests or measurements:  Height, weight, and BMI.  Blood pressure.  Lipid and cholesterol levels. These may be checked every 5 years, or more frequently if you are over 72 years old.  Skin check.  Lung cancer screening. You may have this screening every year starting at age 72 if you have a 30-pack-year history of smoking and currently smoke or have quit within the past 15 years.  Fecal occult blood test (FOBT) of the stool. You may have this test every year starting at age 72.  Flexible sigmoidoscopy or colonoscopy. You may have a sigmoidoscopy every 5 years or a colonoscopy every  10 years starting at age 72.  Prostate cancer screening. Recommendations will vary depending on your family history and other  risks.  Hepatitis C blood test.  Hepatitis B blood test.  Sexually transmitted disease (STD) testing.  Diabetes screening. This is done by checking your blood sugar (glucose) after you have not eaten for a while (fasting). You may have this done every 1-3 years.  Abdominal aortic aneurysm (AAA) screening. You may need this if you are a current or former smoker.  Osteoporosis. You may be screened starting at age 72 if you are at high risk. Talk with your health care provider about your test results, treatment options, and if necessary, the need for more tests. Vaccines  Your health care provider may recommend certain vaccines, such as:  Influenza vaccine. This is recommended every year.  Tetanus, diphtheria, and acellular pertussis (Tdap, Td) vaccine. You may need a Td booster every 10 years.  Zoster vaccine. You may need this after age 72.  Pneumococcal 13-valent conjugate (PCV13) vaccine. One dose is recommended after age 72.  Pneumococcal polysaccharide (PPSV23) vaccine. One dose is recommended after age 72. Talk to your health care provider about which screenings and vaccines you need and how often you need them. This information is not intended to replace advice given to you by your health care provider. Make sure you discuss any questions you have with your health care provider. Document Released: 05/03/2015 Document Revised: 12/25/2015 Document Reviewed: 02/05/2015 Elsevier Interactive Patient Education  2017 [employer] Prevention in the Home Falls can cause injuries. They can happen to people of all ages. There are many things you can do to make your home safe and to help prevent falls. What can I do on the outside of my home?  Regularly fix the edges of walkways and driveways and fix any cracks.  Remove anything that might make you trip as you walk through a door, such as a raised step or threshold.  Trim any bushes or trees on the path to your home.  Use  bright outdoor lighting.  Clear any walking paths of anything that might make someone trip, such as rocks or tools.  Regularly check to see if handrails are loose or broken. Make sure that both sides of any steps have handrails.  Any raised decks and porches should have guardrails on the edges.  Have any leaves, snow, or ice cleared regularly.  Use sand or salt on walking paths during winter.  Clean up any spills in your garage right away. This includes oil or grease spills. What can I do in the bathroom?  Use night lights.  Install grab bars by the toilet and in the tub and shower. Do not use towel bars as grab bars.  Use non-skid mats or decals in the tub or shower.  If you need to sit down in the shower, use a plastic, non-slip stool.  Keep the floor dry. Clean up any water that spills on the floor as soon as it happens.  Remove soap buildup in the tub or shower regularly.  Attach bath mats securely with double-sided non-slip rug tape.  Do not have throw rugs and other things on the floor that can make you trip. What can I do in the bedroom?  Use night lights.  Make sure that you have a light by your bed that is easy to reach.  Do not use any sheets or blankets that are too big for your bed. They should not  hang down onto the floor.  Have a firm chair that has side arms. You can use this for support while you get dressed.  Do not have throw rugs and other things on the floor that can make you trip. What can I do in the kitchen?  Clean up any spills right away.  Avoid walking on wet floors.  Keep items that you use a lot in easy-to-reach places.  If you need to reach something above you, use a strong step stool that has a grab bar.  Keep electrical cords out of the way.  Do not use floor polish or wax that makes floors slippery. If you must use wax, use non-skid floor wax.  Do not have throw rugs and other things on the floor that can make you trip. What can  I do with my stairs?  Do not leave any items on the stairs.  Make sure that there are handrails on both sides of the stairs and use them. Fix handrails that are broken or loose. Make sure that handrails are as long as the stairways.  Check any carpeting to make sure that it is firmly attached to the stairs. Fix any carpet that is loose or worn.  Avoid having throw rugs at the top or bottom of the stairs. If you do have throw rugs, attach them to the floor with carpet tape.  Make sure that you have a light switch at the top of the stairs and the bottom of the stairs. If you do not have them, ask someone to add them for you. What else can I do to help prevent falls?  Wear shoes that:  Do not have high heels.  Have rubber bottoms.  Are comfortable and fit you well.  Are closed at the toe. Do not wear sandals.  If you use a stepladder:  Make sure that it is fully opened. Do not climb a closed stepladder.  Make sure that both sides of the stepladder are locked into place.  Ask someone to hold it for you, if possible.  Clearly mark and make sure that you can see:  Any grab bars or handrails.  First and last steps.  Where the edge of each step is.  Use tools that help you move around (mobility aids) if they are needed. These include:  Canes.  Walkers.  Scooters.  Crutches.  Turn on the lights when you go into a dark area. Replace any light bulbs as soon as they burn out.  Set up your furniture so you have a clear path. Avoid moving your furniture around.  If any of your floors are uneven, fix them.  If there are any pets around you, be aware of where they are.  Review your medicines with your doctor. Some medicines can make you feel dizzy. This can increase your chance of falling. Ask your doctor what other things that you can do to help prevent falls. This information is not intended to replace advice given to you by your health care provider. Make sure you  discuss any questions you have with your health care provider. Document Released: 01/31/2009 Document Revised: 09/12/2015 Document Reviewed: 05/11/2014 Elsevier Interactive Patient Education  2017 [employer].

## 2020-01-29 NOTE — Progress Notes (Signed)
Subjective:   Jacob Chapman is a 74 y.o. male who presents for Medicare Annual/Subsequent preventive examination.  Review of Systems    No ROS.Medicare Wellness Visit Cardiac Risk Factors include: advanced age (>79mn, >>67women);diabetes mellitus;dyslipidemia;family history of premature cardiovascular disease;hypertension;male gender     Objective:    Today's Vitals   01/29/20 1106  BP: 110/70  Pulse: 71  Resp: 16  Temp: 97.9 F (36.6 C)  SpO2: 95%  Weight: 169 lb 12.8 oz (77 kg)  Height: 5' 6"  (1.676 m)  PainSc: 0-No pain   Body mass index is 27.41 kg/m.  Advanced Directives 01/29/2020 01/24/2019 08/07/2015 11/06/2011  Does Patient Have a Medical Advance Directive? Yes Yes No Patient does not have advance directive  Type of Advance Directive - [place] - -  Does patient want to make changes to medical advance directive? No - Patient declined - - -  Copy of [place]? - No - copy requested - -  Pre-existing out of facility DNR order (yellow form or pink MOST form) - - - No    Current Medications (verified) Outpatient Encounter Medications as of 01/29/2020  Medication Sig  . ACCU-CHEK AVIVA PLUS test strip TEST BLOOD SUGAR ONE TIME DAILY  . Accu-Chek Softclix Lancets lancets TEST BLOOD SUGAR EVERY DAY  . acyclovir (ZOVIRAX) 400 MG tablet TAKE 1 TABLET  3 TIMES DAILY FOR COLD SORES  . amLODipine (NORVASC) 5 MG tablet TAKE 1 AND 1/2 TABLETS EVERY DAY  . aspirin 81 MG EC tablet Take 81 mg by mouth daily.   . Blood Glucose Calibration (ACCU-CHEK AVIVA) SOLN Use for blood sugar machine, DX: E11.9  . Blood Glucose Monitoring Suppl (ACCU-CHEK AVIVA PLUS) w/Device KIT TEST BLOOD SUGAR EVERY DAY  . cholecalciferol (VITAMIN D) 1000 UNITS tablet daily.  . Cyanocobalamin (VITAMIN B12 PO) daily.  .Marland Kitchenerythromycin ophthalmic ointment Place a 1/2 inch ribbon of ointment into the lower eyelid 4 times daily x 1 week  .  glimepiride (AMARYL) 2 MG tablet TAKE 2 TABLETS (4 MG TOTAL) BY MOUTH DAILY BEFORE BREAKFAST.  .Marland Kitchenlosartan (COZAAR) 100 MG tablet TAKE 1 TABLET EVERY DAY  . lovastatin (MEVACOR) 20 MG tablet TAKE 1 TABLET AT BEDTIME  . metaxalone (SKELAXIN) 800 MG tablet Take 1 tablet (800 mg total) by mouth 3 (three) times daily as needed.  . neomycin-polymyxin b-dexamethasone (MAXITROL) 3.5-10000-0.1 SUSP   . pioglitazone (ACTOS) 45 MG tablet TAKE 1 TABLET EVERY DAY  . vitamin B-12 (CYANOCOBALAMIN) 500 MCG tablet Take 500 mcg by mouth daily.    No facility-administered encounter medications on file as of 01/29/2020.    Allergies (verified) Metformin   History: Past Medical History:  Diagnosis Date  . Bulging lumbar disc    no current prob  . Chronic lower back pain    Hx - no current prob  . Diabetes mellitus, type 2 ([place]    type II  . History of gout stable -- last bout 3 yrs ago   no current prob  . HTN (hypertension)   . Hyperlipidemia   . Prostate cancer ([place] 09/01/11   dx Adenocarcinoma   Past Surgical History:  Procedure Laterality Date  . COLONOSCOPY  2005, 2017  . CYSTOSCOPY  11/06/2011   Procedure: CYSTOSCOPY FLEXIBLE;  Surgeon: MClaybon Jabs MD;  [facility]  Service: Urology;  Laterality: N/A;  no seeds found in bladder  . PROSTATE BIOPSY  09/01/2011  DR OTTELIN'S OFFICE   gleason 3+3=6,volume=21cc,PSA=6.73  . RADIOACTIVE SEED IMPLANT  11/06/2011   Procedure: RADIOACTIVE SEED IMPLANT;  Surgeon: Claybon Jabs, MD;  Location: [facility];  Service: Urology;  Laterality: Bilateral;  85 seeds implanted  . VASECTOMY     Family History  Adopted: Yes  Problem Relation Age of Onset  . Stroke Mother        87  . Cancer Father 52       lung ca  . Heart disease Sister 36       rhematic fever  . Hypertension Other 21       maternal 1st cousin,leukemia deceased  . Colon cancer Neg Hx   . Colon polyps Neg Hx   . Esophageal cancer Neg Hx     . Rectal cancer Neg Hx   . Stomach cancer Neg Hx    Social History   Socioeconomic History  . Marital status: Widowed    Spouse name: Not on file  . Number of children: 1  . Years of education: Not on file  . Highest education level: Not on file  Occupational History  . 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 Smoking status: Never Smoker  . Smokeless tobacco: Never Used  Vaping Use  . Vaping Use: Never used  Substance and Sexual Activity  . Alcohol use: No    Alcohol/week: 0.0 standard drinks  . Drug use: No  . Sexual activity: Not Currently  Other Topics Concern  . Not on file  Social History Narrative   Regular exercise - YES         Social Determinants of Health   Financial Resource Strain: Low Risk   . Difficulty of Paying Living Expenses: Not hard at all  Food Insecurity: No Food Insecurity  . Worried About Charity fundraiser in the Last Year: Never true  . Ran Out of Food in the Last Year: Never true  Transportation Needs: No Transportation Needs  . Lack of Transportation (Medical): No  . Lack of Transportation (Non-Medical): No  Physical Activity: Sufficiently Active  . Days of Exercise per Week: 5 days  . Minutes of Exercise per Session: 30 min  Stress: No Stress Concern Present  . Feeling of Stress : Not at all  Social Connections: Socially Isolated  . Frequency of Communication with Friends and Family: More than three times a week  . Frequency of Social Gatherings with Friends and Family: Never  . Attends Religious Services: Never  . Active Member of Clubs or Organizations: No  . Attends Archivist Meetings: Never  . Marital Status: Never married    Tobacco Counseling Counseling given: Not Answered   Clinical Intake:  Pre-visit preparation completed: Yes  Pain : No/denies pain Pain Score: 0-No pain     BMI - recorded: 27.41 Nutritional  Status: BMI of 19-24  Normal Nutritional Risks: None Diabetes: Yes CBG done?: No Did pt. bring in CBG monitor from home?: No  How often do you need to have someone help you when you read instructions, pamphlets, or other written materials from your doctor or pharmacy?: 1 - Never What is the last grade level you completed in school?: Associate's Degree  Diabetic? yes  Interpreter Needed?: No  Information entered by :: Delma Drone N. Rjay Revolorio, LPN   Activities of Daily Living In your present state of health, do you have  any difficulty performing the following activities: 01/29/2020  Hearing? N  Vision? N  Difficulty concentrating or making decisions? N  Walking or climbing stairs? N  Dressing or bathing? N  Doing errands, shopping? N  Preparing Food and eating ? N  Using the Toilet? N  In the past six months, have you accidently leaked urine? N  Do you have problems with loss of bowel control? N  Managing your Medications? N  Managing your Finances? N  Housekeeping or managing your Housekeeping? N  Some recent data might be hidden    Patient Care Team: Plotnikov, Evie Lacks, MD as PCP - General Kathie Rhodes, MD as Attending Physician (Urology)  Indicate any recent Medical Services you may have received from other than [facility] providers in the past year (date may be approximate).     Assessment:   This is a routine wellness examination for Jacob Chapman.  Hearing/Vision screen No exam data present  Dietary issues and exercise activities discussed: Current Exercise Habits: Home exercise routine;The patient has a physically strenuous job, but has no regular exercise apart from work., Type of exercise: walking;Other - see comments (exercise every morning; walk 3-4 times a week; works part-time job), Time (Minutes): 30, Frequency (Times/Week): 7, Weekly Exercise (Minutes/Week): 210, Intensity: Moderate, Exercise limited by: None identified  Goals    . Patient Stated     Maintain my  current health status by continuing to eat healthy and be active.      Depression Screen PHQ 2/9 Scores 01/29/2020 01/24/2019 10/26/2017 07/25/2016 01/30/2016 10/23/2014 09/20/2014  PHQ - 2 Score 0 0 0 0 0 0 0    Fall Risk Fall Risk  01/29/2020 01/24/2019 10/26/2017 07/25/2016 01/30/2016  Falls in the past year? 0 0 No No No  Number falls in past yr: 0 - - - -  Injury with Fall? 0 - - - -  Risk for fall due to : No Fall Risks - - - -  Follow up Falls evaluation completed;Education provided Falls evaluation completed - - -    Any stairs in or around the home? No  If so, are there any without handrails? Yes  Home free of loose throw rugs in walkways, pet beds, electrical cords, etc? Yes  Adequate lighting in your home to reduce risk of falls? Yes   ASSISTIVE DEVICES UTILIZED TO PREVENT FALLS:  Life alert? No  Use of a cane, walker or w/c? No  Grab bars in the bathroom? No  Shower chair or bench in shower? No  Elevated toilet seat or a handicapped toilet? No   TIMED UP AND GO:  Was the test performed? No .  Length of time to ambulate 10 feet: 0 sec.   Gait steady and fast without use of assistive device  Cognitive Function:     6CIT Screen 01/29/2020  What Year? 0 points  What month? 0 points  What time? 0 points  Count back from 20 0 points  Months in reverse 0 points  Repeat phrase 0 points  Total Score 0    Immunizations Immunization History  Administered Date(s) Administered  . Fluad Quad(high Dose 65+) 01/24/2019  . Influenza, High Dose Seasonal PF 02/17/2017, 03/08/2018  . Janssen (J&J) SARS-COV-2 Vaccination 08/30/2019  . Pneumococcal Conjugate-13 10/08/2015  . Pneumococcal Polysaccharide-23 09/29/2016  . Td 10/24/2013  . Zoster 05/06/2009    TDAP status: Up to date Flu Vaccine status: Up to date Pneumococcal vaccine status: Up to date Covid-19 vaccine status: Completed vaccines  Qualifies  for Shingles Vaccine? Yes   Zostavax completed Yes   Shingrix  Completed?: No.    Education has been provided regarding the importance of this vaccine. Patient has been advised to call insurance company to determine out of pocket expense if they have not yet received this vaccine. Advised may also receive vaccine at local pharmacy or Health Dept. Verbalized acceptance and understanding.  Screening Tests Health Maintenance  Topic Date Due  . Hepatitis C Screening  Never done  . INFLUENZA VACCINE  11/19/2019  . FOOT EXAM  01/24/2020  . HEMOGLOBIN A1C  05/16/2020  . OPHTHALMOLOGY EXAM  09/03/2020  . TETANUS/TDAP  10/25/2023  . COLONOSCOPY  08/20/2025  . COVID-19 Vaccine  Completed  . PNA vac Low Risk Adult  Completed    Health Maintenance  Health Maintenance Due  Topic Date Due  . Hepatitis C Screening  Never done  . INFLUENZA VACCINE  11/19/2019  . FOOT EXAM  01/24/2020    Colorectal cancer screening: Completed 08/21/2015. Repeat every 10 years  Lung Cancer Screening: (Low Dose CT Chest recommended if Age 50-80 years, 30 pack-year currently smoking OR have quit w/in 15years.) does not qualify.   Lung Cancer Screening Referral: no  Additional Screening:  Hepatitis C Screening: does qualify; Completed no  Vision Screening: Recommended annual ophthalmology exams for early detection of glaucoma and other disorders of the eye. Is the patient up to date with their annual eye exam?  Yes  Who is the provider or what is the name of the office in which the patient attends annual eye exams? Katy Apo, MD If pt is not established with a provider, would they like to be referred to a provider to establish care? No .   Dental Screening: Recommended annual dental exams for proper oral hygiene  Community Resource Referral / Chronic Care Management: CRR required this visit?  No   CCM required this visit?  No      Plan:     I have personally reviewed and noted the following in the patient's chart:   . Medical and social history . Use of  alcohol, tobacco or illicit drugs  . Current medications and supplements . Functional ability and status . Nutritional status . Physical activity . Advanced directives . List of other physicians . Hospitalizations, surgeries, and ER visits in previous 12 months . Vitals . Screenings to include cognitive, depression, and falls . Referrals and appointments  In addition, I have reviewed and discussed with patient certain preventive protocols, quality metrics, and best practice recommendations. A written personalized care plan for preventive services as well as general preventive health recommendations were provided to patient.     Sheral Flow, LPN   [id number]   Nurse Notes: n/a

## 2020-02-21 ENCOUNTER — Other Ambulatory Visit: Payer: Self-pay

## 2020-02-21 ENCOUNTER — Encounter: Payer: Self-pay | Admitting: Emergency Medicine

## 2020-02-21 ENCOUNTER — Ambulatory Visit
Admission: EM | Admit: 2020-02-21 | Discharge: 2020-02-21 | Disposition: A | Discharge status: Home / Self Care | Payer: Medicare HMO | Attending: Physician Assistant | Admitting: Physician Assistant

## 2020-02-21 DIAGNOSIS — M545 Low back pain, unspecified: Secondary | ICD-10-CM | POA: Diagnosis not present

## 2020-02-21 MED ORDER — METAXALONE 800 MG PO TABS: 800.0000 mg | ORAL_TABLET | Freq: Three times a day (TID) | ORAL | 0 refills | Status: DC | PRN

## 2020-02-21 MED ORDER — TIZANIDINE HCL 2 MG PO TABS
2.0000 mg | ORAL_TABLET | Freq: Three times a day (TID) | ORAL | 0 refills | Status: DC | PRN
Start: 2020-02-21 — End: 2023-02-01

## 2020-02-21 NOTE — ED Triage Notes (Signed)
PT injured back while changing sheets on bed. Felt a pop while bending. Has been taking skelaxin and ibuprofen. Ran out of skelaxin

## 2020-02-21 NOTE — ED Provider Notes (Addendum)
[facility]    CSN: [id number] Arrival date & time: 02/21/20  1354      History   Chief Complaint Chief Complaint  Patient presents with  . Back Pain    HPI Jacob Chapman is a 74 y.o. male.   74 year old male comes in for 2 day history of back pain. States started having pain while changing sheets on the bed. Bilateral low back pain that can radiate to the thigh. Denies numbness/tingling. Denies saddle anesthesia, loss of bladder or bowel control. Has skelaxin and was taking with some relief, however ran out of medication.   Last a1c 6.7     Past Medical History:  Diagnosis Date  . Bulging lumbar disc    no current prob  . Chronic lower back pain    Hx - no current prob  . Diabetes mellitus, type 2 (HCC)    type II  . History of gout stable -- last bout 3 yrs ago   no current prob  . HTN (hypertension)   . Hyperlipidemia   . Prostate cancer ([facility]) 09/01/11   dx Adenocarcinoma    Patient Active Problem List   Diagnosis Date Noted  . Leg edema, left 08/22/2019  . Grief 02/26/2016  . Plantar fasciitis of right foot 02/26/2014  . Abscess or cellulitis, neck 11/09/2011  . Mass of right side of neck 11/09/2011  . Prostate cancer (HCC) 09/01/2011  . Lipoma of neck 09/12/2010  . Pain in limb 09/03/2009  . CHEST PAIN, UNSPECIFIED 05/02/2008  . OSTEOARTHRITIS 09/09/2007  . LOW BACK PAIN 09/09/2007  . Dyslipidemia 05/02/2007  . Gout 05/02/2007  . Essential hypertension 05/02/2007  . PARESTHESIA 05/02/2007  . DM2 (diabetes mellitus, type 2) (HCC) 04/25/2007    Past Surgical History:  Procedure Laterality Date  . COLONOSCOPY  2005, 2017  . CYSTOSCOPY  11/06/2011   Procedure: CYSTOSCOPY FLEXIBLE;  Surgeon: Mark C Ottelin, MD;  Location: [facility];  Service: Urology;  Laterality: N/A;  no seeds found in bladder  . PROSTATE BIOPSY  09/01/2011   DR OTTELIN'S OFFICE   gleason 3+3=6,volume=21cc,PSA=6.73  . RADIOACTIVE SEED IMPLANT   11/06/2011   Procedure: RADIOACTIVE SEED IMPLANT;  Surgeon: Mark C Ottelin, MD;  Location: [facility];  Service: Urology;  Laterality: Bilateral;  85 seeds implanted  . VASECTOMY         Home Medications    Prior to Admission medications   Medication Sig Start Date End Date Taking? Authorizing Provider  ACCU-CHEK AVIVA PLUS test strip TEST BLOOD SUGAR ONE TIME DAILY 04/22/19   Plotnikov, Aleksei V, MD  Accu-Chek Softclix Lancets lancets TEST BLOOD SUGAR EVERY DAY 01/21/20   Plotnikov, Aleksei V, MD  acyclovir (ZOVIRAX) 400 MG tablet TAKE 1 TABLET  3 TIMES DAILY FOR COLD SORES 08/11/17   Plotnikov, Aleksei V, MD  amLODipine (NORVASC) 5 MG tablet TAKE 1 AND 1/2 TABLETS EVERY DAY 11/16/19   Plotnikov, Aleksei V, MD  aspirin 81 MG EC tablet Take 81 mg by mouth daily.     [provider]  Blood Glucose Calibration (ACCU-CHEK AVIVA) SOLN Use for blood sugar machine, DX: E11.9 07/12/18   Plotnikov, Aleksei V, MD  Blood Glucose Monitoring Suppl (ACCU-CHEK AVIVA PLUS) w/Device KIT TEST BLOOD SUGAR EVERY DAY 09/05/18   Plotnikov, Aleksei V, MD  cholecalciferol (VITAMIN D) 1000 UNITS tablet daily. 07/20/14   [provider]  Cyanocobalamin (VITAMIN B12 PO) daily. 07/20/14   [provider]    erythromycin ophthalmic ointment Place a 1/2 inch ribbon of ointment into the lower eyelid 4 times daily x 1 week 02/13/19   Wieters, Hallie C, PA-C  glimepiride (AMARYL) 2 MG tablet TAKE 2 TABLETS (4 MG TOTAL) BY MOUTH DAILY BEFORE BREAKFAST. 06/19/19   Plotnikov, Aleksei V, MD  losartan (COZAAR) 100 MG tablet TAKE 1 TABLET EVERY DAY 08/30/19   Plotnikov, Aleksei V, MD  lovastatin (MEVACOR) 20 MG tablet TAKE 1 TABLET AT BEDTIME 06/19/19   Plotnikov, Aleksei V, MD  neomycin-polymyxin b-dexamethasone (MAXITROL) 3.5-10000-0.1 SUSP  02/21/19   [provider]  pioglitazone (ACTOS) 45 MG tablet TAKE 1 TABLET EVERY DAY 06/19/19   Plotnikov, Aleksei V, MD  tiZANidine (ZANAFLEX) 2 MG  tablet Take 1 tablet (2 mg total) by mouth every 8 (eight) hours as needed for muscle spasms. 02/21/20   Yu, Amy V, PA-C  vitamin B-12 (CYANOCOBALAMIN) 500 MCG tablet Take 500 mcg by mouth daily.     [provider]    Family History Family History  Adopted: Yes  Problem Relation Age of Onset  . Stroke Mother        77  . Cancer Father 57       lung ca  . Heart disease Sister 31       rhematic fever  . Hypertension Other 47       maternal 1st cousin,leukemia deceased  . Colon cancer Neg Hx   . Colon polyps Neg Hx   . Esophageal cancer Neg Hx   . Rectal cancer Neg Hx   . Stomach cancer Neg Hx     Social History Social History   Tobacco Use  . Smoking status: Never Smoker  . Smokeless tobacco: Never Used  Vaping Use  . Vaping Use: Never used  Substance Use Topics  . Alcohol use: No    Alcohol/week: 0.0 standard drinks  . Drug use: No     Allergies   Metformin   Review of Systems Review of Systems  Reason unable to perform ROS: See HPI as above.     Physical Exam Triage Vital Signs ED Triage Vitals  Enc Vitals Group     BP 02/21/20 1415 121/81     Pulse Rate 02/21/20 1415 (!) 54     Resp 02/21/20 1415 18     Temp 02/21/20 1415 97.7 F (36.5 C)     Temp Source 02/21/20 1415 Oral     SpO2 02/21/20 1415 98 %     Weight --      Height --      Head Circumference --      Peak Flow --      Pain Score 02/21/20 1421 7     Pain Loc --      Pain Edu? --      Excl. in GC? --    No data found.  Updated Vital Signs BP 121/81 (BP Location: Left Arm)   Pulse (!) 54   Temp 97.7 F (36.5 C) (Oral)   Resp 18   SpO2 98%   Physical Exam Constitutional:      General: He is not in acute distress.    Appearance: He is well-developed. He is not diaphoretic.  HENT:     Head: Normocephalic and atraumatic.  Eyes:     Conjunctiva/sclera: Conjunctivae normal.     Pupils: Pupils are equal, round, and reactive to light.  Cardiovascular:     Rate and  Rhythm: Normal rate and regular rhythm.       Heart sounds: Normal heart sounds. No murmur heard.  No friction rub. No gallop.   Pulmonary:     Effort: Pulmonary effort is normal. No accessory muscle usage or respiratory distress.     Breath sounds: Normal breath sounds. No stridor. No decreased breath sounds, wheezing, rhonchi or rales.  Musculoskeletal:     Comments: No tenderness on palpation of the spinous processes. No tenderness to palpation of back or hips. Pain is exerbated when sitting to standing, to the right lumbar region. Decreased ROM of back, full ROM of hip. Strength and sensation intact distally.  Skin:    General: Skin is warm and dry.  Neurological:     Mental Status: He is alert and oriented to person, place, and time.     UC Treatments / Results  Labs (all labs ordered are listed, but only abnormal results are displayed) Labs Reviewed - No data to display  EKG   Radiology No results found.  Procedures Procedures (including critical care time)  Medications Ordered in UC Medications - No data to display  Initial Impression / Assessment and Plan / UC Course  I have reviewed the triage vital signs and the nursing notes.  Pertinent labs & imaging results that were available during my care of the patient were reviewed by me and considered in my medical decision making (see chart for details).    Continue NSAIDs. skelexin refilled. Follow up with PCP if symptoms not improving. Return precautions given.  skelexin not covered by insurance, switched to tizanidine.   Final Clinical Impressions(s) / UC Diagnoses   Final diagnoses:  Acute right-sided low back pain without sciatica    ED Prescriptions    Medication Sig Dispense Auth. Provider   metaxalone (SKELAXIN) 800 MG tablet  (Status: Discontinued) Take 1 tablet (800 mg total) by mouth 3 (three) times daily as needed. 30 tablet Rudell Marlowe V, PA-C   tiZANidine (ZANAFLEX) 2 MG tablet Take 1 tablet (2 mg total)  by mouth every 8 (eight) hours as needed for muscle spasms. 15 tablet Ok Edwards, PA-C     PDMP not reviewed this encounter.   Ok Edwards, PA-C 02/21/20 1451    Ok Edwards, PA-C 02/21/20 1621

## 2020-02-21 NOTE — Discharge Instructions (Addendum)
Continue ibuprofen 600-800mg  three times a day. Skelexain as needed. Follow up with PCP if symptoms not improving in 1 week. If experience numbness/tingling of the inner thighs, loss of bladder or bowel control, go to the emergency department for evaluation.

## 2020-03-19 ENCOUNTER — Other Ambulatory Visit (INDEPENDENT_AMBULATORY_CARE_PROVIDER_SITE_OTHER): Payer: Medicare HMO

## 2020-03-19 DIAGNOSIS — R209 Unspecified disturbances of skin sensation: Secondary | ICD-10-CM | POA: Diagnosis not present

## 2020-03-19 DIAGNOSIS — E785 Hyperlipidemia, unspecified: Secondary | ICD-10-CM | POA: Diagnosis not present

## 2020-03-19 DIAGNOSIS — E119 Type 2 diabetes mellitus without complications: Secondary | ICD-10-CM | POA: Diagnosis not present

## 2020-03-19 DIAGNOSIS — M109 Gout, unspecified: Secondary | ICD-10-CM

## 2020-03-19 DIAGNOSIS — I10 Essential (primary) hypertension: Secondary | ICD-10-CM

## 2020-03-19 LAB — BASIC METABOLIC PANEL
BUN: 17 mg/dL (ref 6–23)
CO2: 27 mEq/L (ref 19–32)
Calcium: 8.8 mg/dL (ref 8.4–10.5)
Chloride: 105 mEq/L (ref 96–112)
Creatinine, Ser: 0.98 mg/dL (ref 0.40–1.50)
GFR: 77.23 mL/min (ref >60.00)
Glucose, Bld: 134 mg/dL — ABNORMAL HIGH (ref 70–99)
Potassium: 4.8 mEq/L (ref 3.5–5.1)
Sodium: 139 mEq/L (ref 135–145)

## 2020-03-19 LAB — HEMOGLOBIN A1C: Hgb A1c MFr Bld: 6.8 % — ABNORMAL HIGH (ref 4.6–6.5)

## 2020-03-19 LAB — HEPATIC FUNCTION PANEL
ALT: 20 U/L (ref 0–53)
AST: 21 U/L (ref 0–37)
Albumin: 4 g/dL (ref 3.5–5.2)
Alkaline Phosphatase: 63 U/L (ref 39–117)
Bilirubin, Direct: 0.1 mg/dL (ref 0.0–0.3)
Total Bilirubin: 0.5 mg/dL (ref 0.2–1.2)
Total Protein: 6.8 g/dL (ref 6.0–8.3)

## 2020-03-20 ENCOUNTER — Other Ambulatory Visit: Payer: Self-pay | Admitting: Internal Medicine

## 2020-03-26 ENCOUNTER — Ambulatory Visit (INDEPENDENT_AMBULATORY_CARE_PROVIDER_SITE_OTHER): Payer: Medicare HMO | Admitting: Internal Medicine

## 2020-03-26 ENCOUNTER — Encounter: Payer: Self-pay | Admitting: Internal Medicine

## 2020-03-26 ENCOUNTER — Other Ambulatory Visit: Payer: Self-pay

## 2020-03-26 DIAGNOSIS — M1000 Idiopathic gout, unspecified site: Secondary | ICD-10-CM

## 2020-03-26 DIAGNOSIS — E119 Type 2 diabetes mellitus without complications: Secondary | ICD-10-CM | POA: Diagnosis not present

## 2020-03-26 DIAGNOSIS — E785 Hyperlipidemia, unspecified: Secondary | ICD-10-CM | POA: Diagnosis not present

## 2020-03-26 DIAGNOSIS — M545 Low back pain, unspecified: Secondary | ICD-10-CM

## 2020-03-26 MED ORDER — SHINGRIX 50 MCG/0.5ML IM SUSR: 0.5000 mL | Freq: Once | INTRAMUSCULAR | 1 refills | Status: AC

## 2020-03-26 NOTE — Assessment & Plan Note (Signed)
Losartan, diet

## 2020-03-26 NOTE — Assessment & Plan Note (Signed)
On Actos and Amaryl OK Shingrix

## 2020-03-26 NOTE — Progress Notes (Signed)
Subjective:  Patient ID: Jacob Chapman, male    DOB: [birth year]  Age: 74 y.o. MRN: [id number]  CC: Follow-up (61mof/u)   HPI WRejeana Brockpresents for DM, HTN, dyslipidemia f/u  Outpatient Medications Prior to Visit  Medication Sig Dispense Refill  . ACCU-CHEK AVIVA PLUS test strip TEST BLOOD SUGAR ONE TIME DAILY 100 strip 5  . Accu-Chek Softclix Lancets lancets TEST BLOOD SUGAR EVERY DAY 100 each 3  . acyclovir (ZOVIRAX) 400 MG tablet TAKE 1 TABLET  3 TIMES DAILY FOR COLD SORES 21 tablet 3  . amLODipine (NORVASC) 5 MG tablet TAKE 1 AND 1/2 TABLETS EVERY DAY 135 tablet 2  . aspirin 81 MG EC tablet Take 81 mg by mouth daily.     . Blood Glucose Calibration (ACCU-CHEK AVIVA) SOLN Use for blood sugar machine, DX: E11.9 1 each 0  . Blood Glucose Monitoring Suppl (ACCU-CHEK AVIVA PLUS) w/Device KIT TEST BLOOD SUGAR EVERY DAY 1 kit 0  . cholecalciferol (VITAMIN D) 1000 UNITS tablet daily.    . Cyanocobalamin (VITAMIN B12 PO) daily.    .Marland Kitchenerythromycin ophthalmic ointment Place a 1/2 inch ribbon of ointment into the lower eyelid 4 times daily x 1 week 3.5 g 0  . glimepiride (AMARYL) 2 MG tablet TAKE 2 TABLETS (4 MG TOTAL) BY MOUTH DAILY BEFORE BREAKFAST. 180 tablet 2  . losartan (COZAAR) 100 MG tablet TAKE 1 TABLET EVERY DAY 90 tablet 3  . lovastatin (MEVACOR) 20 MG tablet TAKE 1 TABLET AT BEDTIME 90 tablet 2  . neomycin-polymyxin b-dexamethasone (MAXITROL) 3.5-10000-0.1 SUSP     . pioglitazone (ACTOS) 45 MG tablet TAKE 1 TABLET EVERY DAY 90 tablet 2  . tiZANidine (ZANAFLEX) 2 MG tablet Take 1 tablet (2 mg total) by mouth every 8 (eight) hours as needed for muscle spasms. 15 tablet 0  . vitamin B-12 (CYANOCOBALAMIN) 500 MCG tablet Take 500 mcg by mouth daily.      No facility-administered medications prior to visit.    ROS: Review of Systems  Constitutional: Negative for appetite change, fatigue and unexpected weight change.  HENT: Negative for congestion, nosebleeds, sneezing, sore  throat and trouble swallowing.   Eyes: Negative for itching and visual disturbance.  Respiratory: Negative for cough.   Cardiovascular: Negative for chest pain, palpitations and leg swelling.  Gastrointestinal: Negative for abdominal distention, blood in stool, diarrhea and nausea.  Genitourinary: Negative for frequency and hematuria.  Musculoskeletal: Negative for back pain, gait problem, joint swelling and neck pain.  Skin: Negative for rash.  Neurological: Negative for dizziness, tremors, speech difficulty and weakness.  Psychiatric/Behavioral: Negative for agitation, dysphoric mood and sleep disturbance. The patient is not nervous/anxious.     Objective:  BP 126/66 (BP Location: Left Arm, Patient Position: Sitting, Cuff Size: Normal)   Pulse 66   Temp 97.9 F (36.6 C) (Oral)   Wt 170 lb 12.8 oz (77.5 kg)   SpO2 96%   BMI 27.57 kg/m   BP Readings from Last 3 Encounters:  03/26/20 126/66  02/21/20 121/81  01/29/20 110/70    Wt Readings from Last 3 Encounters:  03/26/20 170 lb 12.8 oz (77.5 kg)  01/29/20 169 lb 12.8 oz (77 kg)  11/21/19 168 lb (76.2 kg)    Physical Exam Constitutional:      General: He is not in acute distress.    Appearance: He is well-developed.     Comments: NAD  Eyes:     Conjunctiva/sclera: Conjunctivae normal.     Pupils:  Pupils are equal, round, and reactive to light.  Neck:     Thyroid: No thyromegaly.     Vascular: No JVD.  Cardiovascular:     Rate and Rhythm: Normal rate and regular rhythm.     Heart sounds: Normal heart sounds. No murmur heard.  No friction rub. No gallop.   Pulmonary:     Effort: Pulmonary effort is normal. No respiratory distress.     Breath sounds: Normal breath sounds. No wheezing or rales.  Chest:     Chest wall: No tenderness.  Abdominal:     General: Bowel sounds are normal. There is no distension.     Palpations: Abdomen is soft. There is no mass.     Tenderness: There is no abdominal tenderness. There is  no guarding or rebound.  Musculoskeletal:        General: No tenderness. Normal range of motion.     Cervical back: Normal range of motion.  Lymphadenopathy:     Cervical: No cervical adenopathy.  Skin:    General: Skin is warm and dry.     Findings: No rash.  Neurological:     Mental Status: He is alert and oriented to person, place, and time.     Cranial Nerves: No cranial nerve deficit.     Motor: No abnormal muscle tone.     Coordination: Coordination normal.     Gait: Gait normal.     Deep Tendon Reflexes: Reflexes are normal and symmetric.  Psychiatric:        Behavior: Behavior normal.        Thought Content: Thought content normal.        Judgment: Judgment normal.    Toenails - trimmed Foot exam - nl  Lab Results  Component Value Date   WBC 8.3 09/20/2014   HGB 14.4 09/20/2014   HCT 43.4 (A) 09/20/2014   PLT 180 10/30/2011   GLUCOSE 134 (H) 03/19/2020   CHOL 114 11/14/2019   TRIG 65.0 11/14/2019   HDL 40.00 11/14/2019   LDLCALC 61 11/14/2019   ALT 20 03/19/2020   AST 21 03/19/2020   NA 139 03/19/2020   K 4.8 03/19/2020   CL 105 03/19/2020   CREATININE 0.98 03/19/2020   BUN 17 03/19/2020   CO2 27 03/19/2020   TSH 2.115 09/20/2014   PSA 0.3 01/30/2016   INR CANCELED 01/18/2014   HGBA1C 6.8 (H) 03/19/2020    No results found.  Assessment & Plan:   There are no diagnoses linked to this encounter.   Meds ordered this encounter  Medications  . Zoster Vaccine Adjuvanted [facility]) injection    Sig: Inject 0.5 mLs into the muscle once for 1 dose. Repeat in  6 months once.    Dispense:  0.5 mL    Refill:  1     Follow-up: No follow-ups on file.  Walker Kehr, MD

## 2020-03-26 NOTE — Assessment & Plan Note (Signed)
Recurrent - doing well now

## 2020-03-26 NOTE — Assessment & Plan Note (Signed)
Lovastatin 

## 2020-04-02 DIAGNOSIS — C61 Malignant neoplasm of prostate: Secondary | ICD-10-CM | POA: Diagnosis not present

## 2020-04-08 DIAGNOSIS — Z8546 Personal history of malignant neoplasm of prostate: Secondary | ICD-10-CM | POA: Diagnosis not present

## 2020-04-08 DIAGNOSIS — N400 Enlarged prostate without lower urinary tract symptoms: Secondary | ICD-10-CM | POA: Diagnosis not present

## 2020-04-25 ENCOUNTER — Other Ambulatory Visit: Payer: Self-pay | Admitting: Internal Medicine

## 2020-05-08 ENCOUNTER — Other Ambulatory Visit: Payer: Medicare HMO

## 2020-05-08 DIAGNOSIS — Z20822 Contact with and (suspected) exposure to covid-19: Secondary | ICD-10-CM

## 2020-05-10 LAB — SARS-COV-2, NAA 2 DAY TAT

## 2020-05-10 LAB — NOVEL CORONAVIRUS, NAA: SARS-CoV-2, NAA: DETECTED — AB

## 2020-05-12 ENCOUNTER — Telehealth: Payer: Self-pay | Admitting: Infectious Diseases

## 2020-05-12 NOTE — Telephone Encounter (Signed)
Called to discuss with patient about COVID-19 symptoms and the use of one of the available treatments for those with mild to moderate Covid symptoms and at a high risk of hospitalization.  Pt appears to qualify for outpatient treatment due to co-morbid conditions and/or a member of an at-risk group in accordance with the FDA Emergency Use Authorization.    Symptom onset: January 14. Not much of an appetite, fatigued.  Vaccinated: J&J 08-2019 Booster?  Immunocompromised? no Qualifiers: Age, DM, HTN   He is not eligible for early treatments given his symptoms are beyond 10 days now. He is having trouble with finding something that tastes OK for him. Primary problem at the moment is altered taste and fatigue.   Will route to PCP to see if his staff can reach out. He seems to have recovered from acute COVID symptoms but may benefit form supportive discussions and possible referral to Post-COVID clinic.    Janene Madeira, NP

## 2020-06-13 ENCOUNTER — Other Ambulatory Visit: Payer: Self-pay | Admitting: Internal Medicine

## 2020-07-15 ENCOUNTER — Other Ambulatory Visit: Payer: Self-pay | Admitting: Internal Medicine

## 2020-07-23 ENCOUNTER — Other Ambulatory Visit (INDEPENDENT_AMBULATORY_CARE_PROVIDER_SITE_OTHER): Payer: Medicare HMO

## 2020-07-23 DIAGNOSIS — E119 Type 2 diabetes mellitus without complications: Secondary | ICD-10-CM

## 2020-07-23 DIAGNOSIS — I10 Essential (primary) hypertension: Secondary | ICD-10-CM | POA: Diagnosis not present

## 2020-07-23 DIAGNOSIS — E785 Hyperlipidemia, unspecified: Secondary | ICD-10-CM | POA: Diagnosis not present

## 2020-07-23 DIAGNOSIS — M109 Gout, unspecified: Secondary | ICD-10-CM | POA: Diagnosis not present

## 2020-07-23 DIAGNOSIS — R209 Unspecified disturbances of skin sensation: Secondary | ICD-10-CM | POA: Diagnosis not present

## 2020-07-23 LAB — LIPID PANEL
Cholesterol: 119 mg/dL (ref 0–200)
HDL: 47.5 mg/dL (ref >39.00)
LDL Cholesterol: 63 mg/dL (ref 0–99)
NonHDL: 71.98
Total CHOL/HDL Ratio: 3
Triglycerides: 44 mg/dL (ref 0.0–149.0)
VLDL: 8.8 mg/dL (ref 0.0–40.0)

## 2020-07-23 LAB — BASIC METABOLIC PANEL
BUN: 19 mg/dL (ref 6–23)
CO2: 22 mEq/L (ref 19–32)
Calcium: 9.1 mg/dL (ref 8.4–10.5)
Chloride: 109 mEq/L (ref 96–112)
Creatinine, Ser: 0.95 mg/dL (ref 0.40–1.50)
GFR: 79.97 mL/min (ref >60.00)
Glucose, Bld: 123 mg/dL — ABNORMAL HIGH (ref 70–99)
Potassium: 4.1 mEq/L (ref 3.5–5.1)
Sodium: 141 mEq/L (ref 135–145)

## 2020-07-23 LAB — HEMOGLOBIN A1C: Hgb A1c MFr Bld: 6.7 % — ABNORMAL HIGH (ref 4.6–6.5)

## 2020-07-29 ENCOUNTER — Other Ambulatory Visit: Payer: Self-pay

## 2020-07-30 ENCOUNTER — Ambulatory Visit (INDEPENDENT_AMBULATORY_CARE_PROVIDER_SITE_OTHER): Payer: Medicare HMO | Admitting: Internal Medicine

## 2020-07-30 ENCOUNTER — Encounter: Payer: Self-pay | Admitting: Internal Medicine

## 2020-07-30 DIAGNOSIS — E785 Hyperlipidemia, unspecified: Secondary | ICD-10-CM | POA: Diagnosis not present

## 2020-07-30 DIAGNOSIS — U071 COVID-19: Secondary | ICD-10-CM | POA: Diagnosis not present

## 2020-07-30 DIAGNOSIS — C61 Malignant neoplasm of prostate: Secondary | ICD-10-CM | POA: Diagnosis not present

## 2020-07-30 DIAGNOSIS — E119 Type 2 diabetes mellitus without complications: Secondary | ICD-10-CM | POA: Diagnosis not present

## 2020-07-30 DIAGNOSIS — I10 Essential (primary) hypertension: Secondary | ICD-10-CM | POA: Diagnosis not present

## 2020-07-30 NOTE — Assessment & Plan Note (Signed)
On Lovastatin

## 2020-07-30 NOTE — Assessment & Plan Note (Signed)
On Amlodipine and Losartan

## 2020-07-30 NOTE — Assessment & Plan Note (Addendum)
Recovered ok

## 2020-07-30 NOTE — Assessment & Plan Note (Addendum)
F/u w/ Dr Milford Cage due in Dec 2022

## 2020-07-30 NOTE — Assessment & Plan Note (Signed)
On Actos and Amaryl

## 2020-07-30 NOTE — Progress Notes (Signed)
Subjective:  Patient ID: Jacob Chapman, male    DOB: [birth year]  Age: 75 y.o. MRN: [id number]  CC: Follow-up (4 MONTH F/U)   HPI Jacob Chapman presents for DM, HTN, dyslipidemia Pt had COVID in 1/22  Outpatient Medications Prior to Visit  Medication Sig Dispense Refill  . ACCU-CHEK AVIVA PLUS test strip TEST BLOOD SUGAR ONE TIME DAILY 100 strip 3  . Accu-Chek Softclix Lancets lancets TEST BLOOD SUGAR EVERY DAY 100 each 3  . acyclovir (ZOVIRAX) 400 MG tablet TAKE 1 TABLET  3 TIMES DAILY FOR COLD SORES 21 tablet 3  . amLODipine (NORVASC) 5 MG tablet TAKE 1 AND 1/2 TABLETS EVERY DAY Annual appt due in April must see provider for future refills 45 tablet 0  . aspirin 81 MG EC tablet Take 81 mg by mouth daily.    . Blood Glucose Calibration (ACCU-CHEK AVIVA) SOLN Use for blood sugar machine, DX: E11.9 1 each 0  . Blood Glucose Monitoring Suppl (ACCU-CHEK AVIVA PLUS) w/Device KIT TEST BLOOD SUGAR EVERY DAY 1 kit 0  . cholecalciferol (VITAMIN D) 1000 UNITS tablet daily.    . Cyanocobalamin (VITAMIN B12 PO) daily.    Marland Kitchen erythromycin ophthalmic ointment Place a 1/2 inch ribbon of ointment into the lower eyelid 4 times daily x 1 week 3.5 g 0  . glimepiride (AMARYL) 2 MG tablet TAKE 2 TABLETS (4 MG TOTAL) BY MOUTH DAILY BEFORE BREAKFAST. 180 tablet 2  . losartan (COZAAR) 100 MG tablet TAKE 1 TABLET EVERY DAY 90 tablet 2  . lovastatin (MEVACOR) 20 MG tablet TAKE 1 TABLET AT BEDTIME 90 tablet 2  . pioglitazone (ACTOS) 45 MG tablet TAKE 1 TABLET EVERY DAY 90 tablet 2  . tiZANidine (ZANAFLEX) 2 MG tablet Take 1 tablet (2 mg total) by mouth every 8 (eight) hours as needed for muscle spasms. 15 tablet 0  . vitamin B-12 (CYANOCOBALAMIN) 500 MCG tablet Take 500 mcg by mouth daily.    Marland Kitchen neomycin-polymyxin b-dexamethasone (MAXITROL) 3.5-10000-0.1 SUSP  (Patient not taking: Reported on 07/30/2020)     No facility-administered medications prior to visit.    ROS: Review of Systems  Constitutional:  Negative for appetite change, fatigue and unexpected weight change.  HENT: Negative for congestion, nosebleeds, sneezing, sore throat and trouble swallowing.   Eyes: Negative for itching and visual disturbance.  Respiratory: Negative for cough.   Cardiovascular: Negative for chest pain, palpitations and leg swelling.  Gastrointestinal: Negative for abdominal distention, blood in stool, diarrhea and nausea.  Genitourinary: Negative for frequency and hematuria.  Musculoskeletal: Negative for back pain, gait problem, joint swelling and neck pain.  Skin: Negative for rash.  Neurological: Negative for dizziness, tremors, speech difficulty and weakness.  Psychiatric/Behavioral: Negative for agitation, dysphoric mood and sleep disturbance. The patient is not nervous/anxious.     Objective:  BP 120/84 (BP Location: Left Arm)   Pulse 60   Temp 97.9 F (36.6 C) (Oral)   Ht 5' 6" (1.676 m)   Wt 169 lb 9.6 oz (76.9 kg)   SpO2 96%   BMI 27.37 kg/m   BP Readings from Last 3 Encounters:  07/30/20 120/84  03/26/20 126/66  02/21/20 121/81    Wt Readings from Last 3 Encounters:  07/30/20 169 lb 9.6 oz (76.9 kg)  03/26/20 170 lb 12.8 oz (77.5 kg)  01/29/20 169 lb 12.8 oz (77 kg)    Physical Exam Constitutional:      General: He is not in acute distress.    Appearance:  He is well-developed.     Comments: NAD  Eyes:     Conjunctiva/sclera: Conjunctivae normal.     Pupils: Pupils are equal, round, and reactive to light.  Neck:     Thyroid: No thyromegaly.     Vascular: No JVD.  Cardiovascular:     Rate and Rhythm: Normal rate and regular rhythm.     Heart sounds: Normal heart sounds. No murmur heard. No friction rub. No gallop.   Pulmonary:     Effort: Pulmonary effort is normal. No respiratory distress.     Breath sounds: Normal breath sounds. No wheezing or rales.  Chest:     Chest wall: No tenderness.  Abdominal:     General: Bowel sounds are normal. There is no distension.      Palpations: Abdomen is soft. There is no mass.     Tenderness: There is no abdominal tenderness. There is no guarding or rebound.  Musculoskeletal:        General: No tenderness. Normal range of motion.     Cervical back: Normal range of motion.  Lymphadenopathy:     Cervical: No cervical adenopathy.  Skin:    General: Skin is warm and dry.     Findings: No rash.  Neurological:     Mental Status: He is alert and oriented to person, place, and time.     Cranial Nerves: No cranial nerve deficit.     Motor: No abnormal muscle tone.     Coordination: Coordination normal.     Gait: Gait normal.     Deep Tendon Reflexes: Reflexes are normal and symmetric.  Psychiatric:        Behavior: Behavior normal.        Thought Content: Thought content normal.        Judgment: Judgment normal.     Lab Results  Component Value Date   WBC 8.3 09/20/2014   HGB 14.4 09/20/2014   HCT 43.4 (A) 09/20/2014   PLT 180 10/30/2011   GLUCOSE 123 (H) 07/23/2020   CHOL 119 07/23/2020   TRIG 44.0 07/23/2020   HDL 47.50 07/23/2020   LDLCALC 63 07/23/2020   ALT 20 03/19/2020   AST 21 03/19/2020   NA 141 07/23/2020   K 4.1 07/23/2020   CL 109 07/23/2020   CREATININE 0.95 07/23/2020   BUN 19 07/23/2020   CO2 22 07/23/2020   TSH 2.115 09/20/2014   PSA 0.3 01/30/2016   INR CANCELED 01/18/2014   HGBA1C 6.7 (H) 07/23/2020    No results found.  Assessment & Plan:    Walker Kehr, MD

## 2020-08-12 ENCOUNTER — Other Ambulatory Visit: Payer: Self-pay | Admitting: Internal Medicine

## 2020-10-08 DIAGNOSIS — L814 Other melanin hyperpigmentation: Secondary | ICD-10-CM | POA: Diagnosis not present

## 2020-10-08 DIAGNOSIS — L821 Other seborrheic keratosis: Secondary | ICD-10-CM | POA: Diagnosis not present

## 2020-10-08 DIAGNOSIS — D225 Melanocytic nevi of trunk: Secondary | ICD-10-CM | POA: Diagnosis not present

## 2020-10-08 DIAGNOSIS — L570 Actinic keratosis: Secondary | ICD-10-CM | POA: Diagnosis not present

## 2020-10-08 DIAGNOSIS — L301 Dyshidrosis [pompholyx]: Secondary | ICD-10-CM | POA: Diagnosis not present

## 2020-10-09 DIAGNOSIS — L821 Other seborrheic keratosis: Secondary | ICD-10-CM | POA: Diagnosis not present

## 2020-10-09 DIAGNOSIS — L570 Actinic keratosis: Secondary | ICD-10-CM | POA: Diagnosis not present

## 2020-10-09 DIAGNOSIS — D225 Melanocytic nevi of trunk: Secondary | ICD-10-CM | POA: Diagnosis not present

## 2020-10-09 DIAGNOSIS — L814 Other melanin hyperpigmentation: Secondary | ICD-10-CM | POA: Diagnosis not present

## 2020-10-09 DIAGNOSIS — L301 Dyshidrosis [pompholyx]: Secondary | ICD-10-CM | POA: Diagnosis not present

## 2020-11-04 ENCOUNTER — Other Ambulatory Visit: Payer: Self-pay | Admitting: Internal Medicine

## 2020-11-11 ENCOUNTER — Other Ambulatory Visit: Payer: Self-pay | Admitting: Internal Medicine

## 2020-11-26 ENCOUNTER — Other Ambulatory Visit (INDEPENDENT_AMBULATORY_CARE_PROVIDER_SITE_OTHER): Payer: Medicare HMO

## 2020-11-26 DIAGNOSIS — I10 Essential (primary) hypertension: Secondary | ICD-10-CM

## 2020-11-26 DIAGNOSIS — M109 Gout, unspecified: Secondary | ICD-10-CM

## 2020-11-26 DIAGNOSIS — R209 Unspecified disturbances of skin sensation: Secondary | ICD-10-CM

## 2020-11-26 DIAGNOSIS — E119 Type 2 diabetes mellitus without complications: Secondary | ICD-10-CM

## 2020-11-26 DIAGNOSIS — E785 Hyperlipidemia, unspecified: Secondary | ICD-10-CM | POA: Diagnosis not present

## 2020-11-26 LAB — BASIC METABOLIC PANEL
BUN: 15 mg/dL (ref 6–23)
CO2: 27 mEq/L (ref 19–32)
Calcium: 8.8 mg/dL (ref 8.4–10.5)
Chloride: 106 mEq/L (ref 96–112)
Creatinine, Ser: 0.99 mg/dL (ref 0.40–1.50)
GFR: 75.93 mL/min (ref >60.00)
Glucose, Bld: 130 mg/dL — ABNORMAL HIGH (ref 70–99)
Potassium: 4.2 mEq/L (ref 3.5–5.1)
Sodium: 140 mEq/L (ref 135–145)

## 2020-11-26 LAB — LIPID PANEL
Cholesterol: 135 mg/dL (ref 0–200)
HDL: 42.1 mg/dL (ref >39.00)
LDL Cholesterol: 79 mg/dL (ref 0–99)
NonHDL: 92.94
Total CHOL/HDL Ratio: 3
Triglycerides: 72 mg/dL (ref 0.0–149.0)
VLDL: 14.4 mg/dL (ref 0.0–40.0)

## 2020-11-26 LAB — HEPATIC FUNCTION PANEL
ALT: 17 U/L (ref 0–53)
AST: 20 U/L (ref 0–37)
Albumin: 3.9 g/dL (ref 3.5–5.2)
Alkaline Phosphatase: 68 U/L (ref 39–117)
Bilirubin, Direct: 0.1 mg/dL (ref 0.0–0.3)
Total Bilirubin: 0.5 mg/dL (ref 0.2–1.2)
Total Protein: 6.7 g/dL (ref 6.0–8.3)

## 2020-11-26 LAB — HEMOGLOBIN A1C: Hgb A1c MFr Bld: 7 % — ABNORMAL HIGH (ref 4.6–6.5)

## 2020-12-03 ENCOUNTER — Encounter: Payer: Self-pay | Admitting: Internal Medicine

## 2020-12-03 ENCOUNTER — Ambulatory Visit (INDEPENDENT_AMBULATORY_CARE_PROVIDER_SITE_OTHER): Payer: Medicare HMO | Admitting: Internal Medicine

## 2020-12-03 ENCOUNTER — Other Ambulatory Visit: Payer: Self-pay

## 2020-12-03 VITALS — BP 132/86 | HR 57 | Temp 97.9°F | Ht 66.0 in | Wt 170.4 lb

## 2020-12-03 DIAGNOSIS — M1000 Idiopathic gout, unspecified site: Secondary | ICD-10-CM | POA: Diagnosis not present

## 2020-12-03 DIAGNOSIS — I10 Essential (primary) hypertension: Secondary | ICD-10-CM

## 2020-12-03 DIAGNOSIS — E119 Type 2 diabetes mellitus without complications: Secondary | ICD-10-CM | POA: Diagnosis not present

## 2020-12-03 DIAGNOSIS — E785 Hyperlipidemia, unspecified: Secondary | ICD-10-CM | POA: Diagnosis not present

## 2020-12-03 DIAGNOSIS — Z0000 Encounter for general adult medical examination without abnormal findings: Secondary | ICD-10-CM

## 2020-12-03 NOTE — Progress Notes (Signed)
Subjective:  Patient ID: Jacob Chapman, male    DOB: [birth year]  Age: 75 y.o. MRN: [id number]  CC: Follow-up (4 month f/u)   HPI Jacob Chapman presents for DM, HTN, dyslipidemia f/u  Outpatient Medications Prior to Visit  Medication Sig Dispense Refill   ACCU-CHEK AVIVA PLUS test strip TEST BLOOD SUGAR ONE TIME DAILY 100 strip 3   Accu-Chek Softclix Lancets lancets TEST BLOOD SUGAR EVERY DAY 100 each 3   acyclovir (ZOVIRAX) 400 MG tablet TAKE 1 TABLET  3 TIMES DAILY FOR COLD SORES 21 tablet 3   amLODipine (NORVASC) 5 MG tablet TAKE 1 AND 1/2 TABLETS EVERY DAY 135 tablet 3   aspirin 81 MG EC tablet Take 81 mg by mouth daily.     Blood Glucose Calibration (ACCU-CHEK AVIVA) SOLN Use for blood sugar machine, DX: E11.9 1 each 0   Blood Glucose Monitoring Suppl (ACCU-CHEK AVIVA PLUS) w/Device KIT TEST BLOOD SUGAR EVERY DAY 1 kit 0   cholecalciferol (VITAMIN D) 1000 UNITS tablet daily.     Cyanocobalamin (VITAMIN B12 PO) daily.     erythromycin ophthalmic ointment Place a 1/2 inch ribbon of ointment into the lower eyelid 4 times daily x 1 week 3.5 g 0   glimepiride (AMARYL) 2 MG tablet TAKE 2 TABLETS EVERY DAY BEFORE BREAKFAST 180 tablet 3   losartan (COZAAR) 100 MG tablet TAKE 1 TABLET EVERY DAY 90 tablet 2   lovastatin (MEVACOR) 20 MG tablet TAKE 1 TABLET AT BEDTIME 90 tablet 3   pioglitazone (ACTOS) 45 MG tablet TAKE 1 TABLET EVERY DAY 90 tablet 3   tiZANidine (ZANAFLEX) 2 MG tablet Take 1 tablet (2 mg total) by mouth every 8 (eight) hours as needed for muscle spasms. 15 tablet 0   vitamin B-12 (CYANOCOBALAMIN) 500 MCG tablet Take 500 mcg by mouth daily.     No facility-administered medications prior to visit.    ROS: Review of Systems  Constitutional:  Negative for appetite change, fatigue and unexpected weight change.  HENT:  Negative for congestion, nosebleeds, sneezing, sore throat and trouble swallowing.   Eyes:  Negative for itching and visual disturbance.  Respiratory:   Negative for cough.   Cardiovascular:  Negative for chest pain, palpitations and leg swelling.  Gastrointestinal:  Negative for abdominal distention, blood in stool, diarrhea and nausea.  Genitourinary:  Negative for frequency and hematuria.  Musculoskeletal:  Negative for back pain, gait problem, joint swelling and neck pain.  Skin:  Negative for rash.  Neurological:  Negative for dizziness, tremors, speech difficulty and weakness.  Psychiatric/Behavioral:  Negative for agitation, dysphoric mood and sleep disturbance. The patient is not nervous/anxious.    Objective:  BP 132/86 (BP Location: Left Arm)   Pulse (!) 57   Temp 97.9 F (36.6 C) (Oral)   Ht 5' 6"  (1.676 m)   Wt 170 lb 6.4 oz (77.3 kg)   SpO2 96%   BMI 27.50 kg/m   BP Readings from Last 3 Encounters:  12/03/20 132/86  07/30/20 120/84  03/26/20 126/66    Wt Readings from Last 3 Encounters:  12/03/20 170 lb 6.4 oz (77.3 kg)  07/30/20 169 lb 9.6 oz (76.9 kg)  03/26/20 170 lb 12.8 oz (77.5 kg)    Physical Exam Constitutional:      General: He is not in acute distress.    Appearance: He is well-developed.     Comments: NAD  Eyes:     Conjunctiva/sclera: Conjunctivae normal.     Pupils: Pupils  are equal, round, and reactive to light.  Neck:     Thyroid: No thyromegaly.     Vascular: No JVD.  Cardiovascular:     Rate and Rhythm: Normal rate and regular rhythm.     Heart sounds: Normal heart sounds. No murmur heard.   No friction rub. No gallop.  Pulmonary:     Effort: Pulmonary effort is normal. No respiratory distress.     Breath sounds: Normal breath sounds. No wheezing or rales.  Chest:     Chest wall: No tenderness.  Abdominal:     General: Bowel sounds are normal. There is no distension.     Palpations: Abdomen is soft. There is no mass.     Tenderness: There is no abdominal tenderness. There is no guarding or rebound.  Musculoskeletal:        General: No tenderness. Normal range of motion.      Cervical back: Normal range of motion.  Lymphadenopathy:     Cervical: No cervical adenopathy.  Skin:    General: Skin is warm and dry.     Findings: No rash.  Neurological:     Mental Status: He is alert and oriented to person, place, and time.     Cranial Nerves: No cranial nerve deficit.     Motor: No abnormal muscle tone.     Coordination: Coordination normal.     Gait: Gait normal.     Deep Tendon Reflexes: Reflexes are normal and symmetric.  Psychiatric:        Behavior: Behavior normal.        Thought Content: Thought content normal.        Judgment: Judgment normal.    Lab Results  Component Value Date   WBC 8.3 09/20/2014   HGB 14.4 09/20/2014   HCT 43.4 (A) 09/20/2014   PLT 180 10/30/2011   GLUCOSE 130 (H) 11/26/2020   CHOL 135 11/26/2020   TRIG 72.0 11/26/2020   HDL 42.10 11/26/2020   LDLCALC 79 11/26/2020   ALT 17 11/26/2020   AST 20 11/26/2020   NA 140 11/26/2020   K 4.2 11/26/2020   CL 106 11/26/2020   CREATININE 0.99 11/26/2020   BUN 15 11/26/2020   CO2 27 11/26/2020   TSH 2.115 09/20/2014   PSA 0.3 01/30/2016   INR CANCELED 01/18/2014   HGBA1C 7.0 (H) 11/26/2020    No results found.  Assessment & Plan:     Walker Kehr, MD

## 2020-12-03 NOTE — Assessment & Plan Note (Signed)
Cont on Lovastatin 20 mg a day

## 2020-12-03 NOTE — Assessment & Plan Note (Signed)
Controlled Cont on Norvasc 5 mg and Losartan 100 mg/d

## 2020-12-03 NOTE — Assessment & Plan Note (Signed)
No relapse on diet

## 2020-12-03 NOTE — Assessment & Plan Note (Signed)
Cont on Actos and Amaryl. Improve diet RTC 3 mo

## 2020-12-16 DIAGNOSIS — H25013 Cortical age-related cataract, bilateral: Secondary | ICD-10-CM | POA: Diagnosis not present

## 2020-12-16 DIAGNOSIS — E119 Type 2 diabetes mellitus without complications: Secondary | ICD-10-CM | POA: Diagnosis not present

## 2020-12-16 DIAGNOSIS — E1136 Type 2 diabetes mellitus with diabetic cataract: Secondary | ICD-10-CM | POA: Diagnosis not present

## 2020-12-16 DIAGNOSIS — H2513 Age-related nuclear cataract, bilateral: Secondary | ICD-10-CM | POA: Diagnosis not present

## 2020-12-16 DIAGNOSIS — H524 Presbyopia: Secondary | ICD-10-CM | POA: Diagnosis not present

## 2020-12-16 LAB — HM DIABETES EYE EXAM

## 2020-12-25 ENCOUNTER — Encounter: Payer: Self-pay | Admitting: Internal Medicine

## 2021-01-21 ENCOUNTER — Other Ambulatory Visit: Payer: Self-pay | Admitting: Internal Medicine

## 2021-01-28 ENCOUNTER — Ambulatory Visit: Payer: Medicare HMO

## 2021-01-29 ENCOUNTER — Ambulatory Visit: Payer: Medicare HMO

## 2021-02-11 ENCOUNTER — Ambulatory Visit: Payer: Medicare HMO

## 2021-02-18 ENCOUNTER — Other Ambulatory Visit: Payer: Self-pay

## 2021-02-18 ENCOUNTER — Ambulatory Visit (INDEPENDENT_AMBULATORY_CARE_PROVIDER_SITE_OTHER): Payer: Medicare HMO

## 2021-02-18 ENCOUNTER — Other Ambulatory Visit: Payer: Self-pay | Admitting: Internal Medicine

## 2021-02-18 VITALS — BP 122/70 | HR 56 | Temp 98.2°F | Ht 66.0 in | Wt 168.4 lb

## 2021-02-18 DIAGNOSIS — Z0000 Encounter for general adult medical examination without abnormal findings: Secondary | ICD-10-CM

## 2021-02-18 DIAGNOSIS — Z23 Encounter for immunization: Secondary | ICD-10-CM

## 2021-02-18 NOTE — Patient Instructions (Signed)
Jacob Chapman , Thank you for taking time to come for your Medicare Wellness Visit. I appreciate your ongoing commitment to your health goals. Please review the following plan we discussed and let me know if I can assist you in the future.   Screening recommendations/referrals: Colonoscopy: 5/83/2017; due every 10 years (due 2027) Recommended yearly ophthalmology/optometry visit for glaucoma screening and checkup Recommended yearly dental visit for hygiene and checkup  Vaccinations: Influenza vaccine: 02/18/2021 Pneumococcal vaccine: 10/08/2015, 09/29/2016 Tdap vaccine: 10/24/2013; due every 10 years (due 2025) Shingles vaccine: 03/28/2020, 05/20/2020  Covid-19: 08/30/2019 ([place])  Advanced directives: Please bring a copy of your health care power of attorney and living will to the office at your convenience.  Conditions/risks identified: Yes; Client understands the importance of follow-up with providers by attending scheduled visits and discussed goals to eat healthier, increase physical activity, exercise the brain, socialize more, get enough sleep and make time for laughter.  Next appointment: Please schedule your next Medicare Wellness Visit with your Nurse Health Advisor in 1 year by calling [phone].  Preventive Care 10 Years and Older, Male Preventive care refers to lifestyle choices and visits with your health care provider that can promote health and wellness. What does preventive care include? A yearly physical exam. This is also called an annual well check. Dental exams once or twice a year. Routine eye exams. Ask your health care provider how often you should have your eyes checked. Personal lifestyle choices, including: Daily care of your teeth and gums. Regular physical activity. Eating a healthy diet. Avoiding tobacco and drug use. Limiting alcohol use. Practicing safe sex. Taking low doses of aspirin every day. Taking vitamin and mineral supplements as  recommended by your health care provider. What happens during an annual well check? The services and screenings done by your health care provider during your annual well check will depend on your age, overall health, lifestyle risk factors, and family history of disease. Counseling  Your health care provider may ask you questions about your: Alcohol use. Tobacco use. Drug use. Emotional well-being. Home and relationship well-being. Sexual activity. Eating habits. History of falls. Memory and ability to understand (cognition). Work and work Statistician. Screening  You may have the following tests or measurements: Height, weight, and BMI. Blood pressure. Lipid and cholesterol levels. These may be checked every 5 years, or more frequently if you are over 11 years old. Skin check. Lung cancer screening. You may have this screening every year starting at age 12 if you have a 30-pack-year history of smoking and currently smoke or have quit within the past 15 years. Fecal occult blood test (FOBT) of the stool. You may have this test every year starting at age 20. Flexible sigmoidoscopy or colonoscopy. You may have a sigmoidoscopy every 5 years or a colonoscopy every 10 years starting at age 49. Prostate cancer screening. Recommendations will vary depending on your family history and other risks. Hepatitis C blood test. Hepatitis B blood test. Sexually transmitted disease (STD) testing. Diabetes screening. This is done by checking your blood sugar (glucose) after you have not eaten for a while (fasting). You may have this done every 1-3 years. Abdominal aortic aneurysm (AAA) screening. You may need this if you are a current or former smoker. Osteoporosis. You may be screened starting at age 58 if you are at high risk. Talk with your health care provider about your test results, treatment options, and if necessary, the need for more tests. Vaccines  Your health  care provider may recommend  certain vaccines, such as: Influenza vaccine. This is recommended every year. Tetanus, diphtheria, and acellular pertussis (Tdap, Td) vaccine. You may need a Td booster every 10 years. Zoster vaccine. You may need this after age 69. Pneumococcal 13-valent conjugate (PCV13) vaccine. One dose is recommended after age 25. Pneumococcal polysaccharide (PPSV23) vaccine. One dose is recommended after age 27. Talk to your health care provider about which screenings and vaccines you need and how often you need them. This information is not intended to replace advice given to you by your health care provider. Make sure you discuss any questions you have with your health care provider. Document Released: 05/03/2015 Document Revised: 12/25/2015 Document Reviewed: 02/05/2015 Elsevier Interactive Patient Education  2017 [place] Prevention in the Home Falls can cause injuries. They can happen to people of all ages. There are many things you can do to make your home safe and to help prevent falls. What can I do on the outside of my home? Regularly fix the edges of walkways and driveways and fix any cracks. Remove anything that might make you trip as you walk through a door, such as a raised step or threshold. Trim any bushes or trees on the path to your home. Use bright outdoor lighting. Clear any walking paths of anything that might make someone trip, such as rocks or tools. Regularly check to see if handrails are loose or broken. Make sure that both sides of any steps have handrails. Any raised decks and porches should have guardrails on the edges. Have any leaves, snow, or ice cleared regularly. Use sand or salt on walking paths during winter. Clean up any spills in your garage right away. This includes oil or grease spills. What can I do in the bathroom? Use night lights. Install grab bars by the toilet and in the tub and shower. Do not use towel bars as grab bars. Use non-skid mats or  decals in the tub or shower. If you need to sit down in the shower, use a plastic, non-slip stool. Keep the floor dry. Clean up any water that spills on the floor as soon as it happens. Remove soap buildup in the tub or shower regularly. Attach bath mats securely with double-sided non-slip rug tape. Do not have throw rugs and other things on the floor that can make you trip. What can I do in the bedroom? Use night lights. Make sure that you have a light by your bed that is easy to reach. Do not use any sheets or blankets that are too big for your bed. They should not hang down onto the floor. Have a firm chair that has side arms. You can use this for support while you get dressed. Do not have throw rugs and other things on the floor that can make you trip. What can I do in the kitchen? Clean up any spills right away. Avoid walking on wet floors. Keep items that you use a lot in easy-to-reach places. If you need to reach something above you, use a strong step stool that has a grab bar. Keep electrical cords out of the way. Do not use floor polish or wax that makes floors slippery. If you must use wax, use non-skid floor wax. Do not have throw rugs and other things on the floor that can make you trip. What can I do with my stairs? Do not leave any items on the stairs. Make sure that there are handrails on  both sides of the stairs and use them. Fix handrails that are broken or loose. Make sure that handrails are as long as the stairways. Check any carpeting to make sure that it is firmly attached to the stairs. Fix any carpet that is loose or worn. Avoid having throw rugs at the top or bottom of the stairs. If you do have throw rugs, attach them to the floor with carpet tape. Make sure that you have a light switch at the top of the stairs and the bottom of the stairs. If you do not have them, ask someone to add them for you. What else can I do to help prevent falls? Wear shoes that: Do not  have high heels. Have rubber bottoms. Are comfortable and fit you well. Are closed at the toe. Do not wear sandals. If you use a stepladder: Make sure that it is fully opened. Do not climb a closed stepladder. Make sure that both sides of the stepladder are locked into place. Ask someone to hold it for you, if possible. Clearly mark and make sure that you can see: Any grab bars or handrails. First and last steps. Where the edge of each step is. Use tools that help you move around (mobility aids) if they are needed. These include: Canes. Walkers. Scooters. Crutches. Turn on the lights when you go into a dark area. Replace any light bulbs as soon as they burn out. Set up your furniture so you have a clear path. Avoid moving your furniture around. If any of your floors are uneven, fix them. If there are any pets around you, be aware of where they are. Review your medicines with your doctor. Some medicines can make you feel dizzy. This can increase your chance of falling. Ask your doctor what other things that you can do to help prevent falls. This information is not intended to replace advice given to you by your health care provider. Make sure you discuss any questions you have with your health care provider. Document Released: 01/31/2009 Document Revised: 09/12/2015 Document Reviewed: 05/11/2014 Elsevier Interactive Patient Education  2017 [employer].

## 2021-02-18 NOTE — Progress Notes (Addendum)
Subjective:   Jacob Chapman is a 75 y.o. male who presents for Medicare Annual/Subsequent preventive examination.  Review of Systems     Cardiac Risk Factors include: advanced age (>15mn, >>44women);hypertension;male gender;diabetes mellitus;dyslipidemia     Objective:    Today's Vitals   02/18/21 1038  BP: 122/70  Pulse: (!) 56  Temp: 98.2 F (36.8 C)  SpO2: 97%  Weight: 168 lb 6.4 oz (76.4 kg)  Height: 5' 6"  (1.676 m)  PainSc: 0-No pain   Body mass index is 27.18 kg/m.  Advanced Directives 02/18/2021 01/29/2020 01/24/2019 08/07/2015 11/06/2011  Does Patient Have a Medical Advance Directive? Yes Yes Yes No Patient does not have advance directive  Type of Advance Directive Living will;[place]care [place] - -  Does patient want to make changes to medical advance directive? No - Patient declined No - Patient declined - - -  Copy of [place] Chart? No - copy requested - No - copy requested - -  Pre-existing out of facility DNR order (yellow form or pink MOST form) - - - - No    Current Medications (verified) Outpatient Encounter Medications as of 02/18/2021  Medication Sig   ACCU-CHEK AVIVA PLUS test strip TEST BLOOD SUGAR ONE TIME DAILY   Accu-Chek Softclix Lancets lancets TEST BLOOD SUGAR EVERY DAY   acyclovir (ZOVIRAX) 400 MG tablet TAKE 1 TABLET  3 TIMES DAILY FOR COLD SORES   amLODipine (NORVASC) 5 MG tablet TAKE 1 AND 1/2 TABLETS EVERY DAY   aspirin 81 MG EC tablet Take 81 mg by mouth daily.   Blood Glucose Calibration (ACCU-CHEK AVIVA) SOLN Use for blood sugar machine, DX: E11.9   Blood Glucose Monitoring Suppl (ACCU-CHEK AVIVA PLUS) w/Device KIT TEST BLOOD SUGAR EVERY DAY   cholecalciferol (VITAMIN D) 1000 UNITS tablet daily.   Cyanocobalamin (VITAMIN B12 PO) daily.   erythromycin ophthalmic ointment Place a 1/2 inch ribbon of ointment into the lower eyelid 4 times daily x 1 week    glimepiride (AMARYL) 2 MG tablet TAKE 2 TABLETS EVERY DAY BEFORE BREAKFAST   losartan (COZAAR) 100 MG tablet TAKE 1 TABLET EVERY DAY   lovastatin (MEVACOR) 20 MG tablet TAKE 1 TABLET AT BEDTIME   pioglitazone (ACTOS) 45 MG tablet TAKE 1 TABLET EVERY DAY   tiZANidine (ZANAFLEX) 2 MG tablet Take 1 tablet (2 mg total) by mouth every 8 (eight) hours as needed for muscle spasms.   vitamin B-12 (CYANOCOBALAMIN) 500 MCG tablet Take 500 mcg by mouth daily.   No facility-administered encounter medications on file as of 02/18/2021.    Allergies (verified) Metformin   History: Past Medical History:  Diagnosis Date   Bulging lumbar disc    no current prob   Chronic lower back pain    Hx - no current prob   Diabetes mellitus, type 2 ([place]    type II   History of gout stable -- last bout 3 yrs ago   no current prob   HTN (hypertension)    Hyperlipidemia    [place]   dx Adenocarcinoma   Past Surgical History:  Procedure Laterality Date   COLONOSCOPY  2005, 2017   CYSTOSCOPY  11/06/2011   Procedure: CYSTOSCOPY FLEXIBLE;  Surgeon: MClaybon Jabs MD;  [facility]  Service: Urology;  Laterality: N/A;  no seeds found in bladder   PROSTATE BIOPSY  09/01/2011   DR OTTELIN'S OFFICE  gleason 3+3=6,volume=21cc,PSA=6.73   RADIOACTIVE SEED IMPLANT  11/06/2011   Procedure: RADIOACTIVE SEED IMPLANT;  Surgeon: Claybon Jabs, MD;  Location[facility];  Service: Urology;  Laterality: Bilateral;  26 seeds implanted   VASECTOMY     Family History  Adopted: Yes  Problem Relation Age of Onset   Stroke Mother        87   Cancer Father 37       lung ca   Heart disease Sister 28       rhematic fever   Hypertension Other 32       maternal 1st cousin,leukemia deceased   Colon cancer Neg Hx    Colon polyps Neg Hx    Esophageal cancer Neg Hx    Rectal cancer Neg Hx    Stomach cancer Neg Hx    Social History   Socioeconomic History    Marital status: Widowed    Spouse name: Not on file   Number of children: 1   Years of education: Not on file   Highest education level: Not on file  Occupational History   Occupation: Retired works part-time for [employer]: [employer]    Comment: Loss adjuster, chartered for [employer]  Tobacco Use   Smoking status: Never   Smokeless tobacco: Never  Scientific laboratory technician Use: Never used  Substance and Sexual Activity   Alcohol use: No    Alcohol/week: 0.0 standard drinks   Drug use: No   Sexual activity: Not Currently  Other Topics Concern   Not on file  Social History Narrative   Regular exercise - YES         Social Determinants of Health   Financial Resource Strain: Low Risk    Difficulty of Paying Living Expenses: Not hard at all  Food Insecurity: No Food Insecurity   Worried About Charity fundraiser in the Last Year: Never true   [place] in the Last Year: Never true  Transportation Needs: No Transportation Needs   Lack of Transportation (Medical): No   Lack of Transportation (Non-Medical): No  Physical Activity: Sufficiently Active   Days of Exercise per Week: 5 days   Minutes of Exercise per Session: 30 min  Stress: No Stress Concern Present   Feeling of Stress : Not at all  Social Connections: Moderately Integrated   Frequency of Communication with Friends and Family: More than three times a week   Frequency of Social Gatherings with Friends and Family: More than three times a week   Attends Religious Services: More than 4 times per year   Active Member of [employer] or Organizations: Yes   Attends Archivist Meetings: More than 4 times per year   Marital Status: Widowed    Tobacco Counseling Counseling given: Not Answered   Clinical Intake:  Pre-visit preparation completed: Yes  Pain : No/denies pain Pain Score: 0-No pain     BMI - recorded: 27.18 Nutritional Status: BMI 25 -29 Overweight Nutritional Risks: None Diabetes: Yes CBG done?:  No Did pt. bring in CBG monitor from home?: No  How often do you need to have someone help you when you read instructions, pamphlets, or other written materials from your doctor or pharmacy?: 1 - Never What is the last grade level you completed in school?: HSG  Diabetic? yes  Interpreter Needed?: No  Information entered by :: Lisette Abu, LPN   Activities of Daily Living In your present state of health,  do you have any difficulty performing the following activities: 02/18/2021  Hearing? N  Vision? N  Difficulty concentrating or making decisions? N  Walking or climbing stairs? N  Dressing or bathing? N  Doing errands, shopping? N  Preparing Food and eating ? N  Using the Toilet? N  In the past six months, have you accidently leaked urine? N  Do you have problems with loss of bowel control? N  Managing your Medications? N  Managing your Finances? N  Housekeeping or managing your Housekeeping? N  Some recent data might be hidden    Patient Care Team: Plotnikov, Evie Lacks, MD as PCP - General Katy Apo, MD as Consulting Physician (Ophthalmology) Remi Haggard, MD as Consulting Physician (Urology)  Indicate any recent Medical Services you may have received from other than [facility] providers in the past year (date may be approximate).     Assessment:   This is a routine wellness examination for Brayln.  Hearing/Vision screen Hearing Screening - Comments:: Patient denied any hearing difficulty.   No hearing aids.  Vision Screening - Comments:: Patient wears corrective glasses/contacts.  Eye exam done annually by: Katy Apo, MD.  Dietary issues and exercise activities discussed: Current Exercise Habits: Home exercise routine, Type of exercise: walking, Time (Minutes): 30, Frequency (Times/Week): 5, Weekly Exercise (Minutes/Week): 150, Intensity: Moderate, Exercise limited by: None identified   Goals Addressed               This Visit's Progress      Patient Stated (pt-stated)        Maintain my current health status by continuing to eat healthy and be active.      Depression Screen PHQ 2/9 Scores 02/18/2021 01/29/2020 01/24/2019 10/26/2017 07/25/2016 01/30/2016 10/23/2014  PHQ - 2 Score 0 0 0 0 0 0 0    Fall Risk Fall Risk  02/18/2021 01/29/2020 01/24/2019 10/26/2017 07/25/2016  Falls in the past year? 0 0 0 No No  Number falls in past yr: 0 0 - - -  Injury with Fall? 0 0 - - -  Risk for fall due to : No Fall Risks No Fall Risks - - -  Follow up Falls evaluation completed Falls evaluation completed;Education provided Falls evaluation completed - -    FALL RISK PREVENTION PERTAINING TO THE HOME:  Any stairs in or around the home? No  If so, are there any without handrails? No  Home free of loose throw rugs in walkways, pet beds, electrical cords, etc? Yes  Adequate lighting in your home to reduce risk of falls? Yes   ASSISTIVE DEVICES UTILIZED TO PREVENT FALLS:  Life alert? No  Use of a cane, walker or w/c? No  Grab bars in the bathroom? No  Shower chair or bench in shower? Yes  Elevated toilet seat or a handicapped toilet? Yes   TIMED UP AND GO:  Was the test performed? Yes .  Length of time to ambulate 10 feet: 6 sec.   Gait steady and fast without use of assistive device  Cognitive Function: Normal cognitive status assessed by direct observation by this Nurse Health Advisor. No abnormalities found.       6CIT Screen 01/29/2020  What Year? 0 points  What month? 0 points  What time? 0 points  Count back from 20 0 points  Months in reverse 0 points  Repeat phrase 0 points  Total Score 0    Immunizations Immunization History  Administered Date(s) Administered   Fluad Quad(high Dose  65+) 01/24/2019, 01/29/2020   Influenza, High Dose Seasonal PF 02/17/2017, 03/08/2018   Janssen (J&J) SARS-COV-2 Vaccination 08/30/2019   Pneumococcal Conjugate-13 10/08/2015   Pneumococcal Polysaccharide-23 09/29/2016   Td 10/24/2013    Zoster Recombinat (Shingrix) 03/28/2020, 05/20/2020   Zoster, Live 05/06/2009    TDAP status: Up to date  Flu Vaccine status: Completed at today's visit  Pneumococcal vaccine status: Up to date  Covid-19 vaccine status: Completed vaccines  Qualifies for Shingles Vaccine? Yes   Zostavax completed Yes   Shingrix Completed?: Yes  Screening Tests Health Maintenance  Topic Date Due   Hepatitis C Screening  Never done   COVID-19 Vaccine (2 - Janssen risk series) 09/27/2019   INFLUENZA VACCINE  11/18/2020   FOOT EXAM  03/26/2021   HEMOGLOBIN A1C  05/29/2021   OPHTHALMOLOGY EXAM  12/16/2021   TETANUS/TDAP  10/25/2023   COLONOSCOPY (Pts 45-79yr Insurance coverage will need to be confirmed)  08/20/2025   Pneumonia Vaccine 75 Years old  Completed   Zoster Vaccines- Shingrix  Completed   HPV VACCINES  Aged Out    Health Maintenance  Health Maintenance Due  Topic Date Due   Hepatitis C Screening  Never done   COVID-19 Vaccine (2 - Janssen risk series) 09/27/2019   INFLUENZA VACCINE  11/18/2020    Colorectal cancer screening: Type of screening: Colonoscopy. Completed 08/21/2015. Repeat every 10 years  Lung Cancer Screening: (Low Dose CT Chest recommended if Age 73-80years, 30 pack-year currently smoking OR have quit w/in 15years.) does not qualify.   Lung Cancer Screening Referral: no  Additional Screening:  Hepatitis C Screening: does qualify; Completed no  Vision Screening: Recommended annual ophthalmology exams for early detection of glaucoma and other disorders of the eye. Is the patient up to date with their annual eye exam?  Yes  Who is the provider or what is the name of the office in which the patient attends annual eye exams? GKaty Apo MD. If pt is not established with a provider, would they like to be referred to a provider to establish care? No .   Dental Screening: Recommended annual dental exams for proper oral hygiene  Community Resource Referral /  Chronic Care Management: CRR required this visit?  No   CCM required this visit?  No      Plan:     I have personally reviewed and noted the following in the patient's chart:   Medical and social history Use of alcohol, tobacco or illicit drugs  Current medications and supplements including opioid prescriptions. Patient is not currently taking opioid prescriptions. Functional ability and status Nutritional status Physical activity Advanced directives List of other physicians Hospitalizations, surgeries, and ER visits in previous 12 months Vitals Screenings to include cognitive, depression, and falls Referrals and appointments  In addition, I have reviewed and discussed with patient certain preventive protocols, quality metrics, and best practice recommendations. A written personalized care plan for preventive services as well as general preventive health recommendations were provided to patient.     SSheral Flow LPN   [id number]  Nurse Notes:  Hearing Screening - Comments:: Patient denied any hearing difficulty.   No hearing aids.  Vision Screening - Comments:: Patient wears corrective glasses/contacts.  Eye exam done annually by: GKaty Apo MD.   Medical screening examination/treatment/procedure(s) were performed by non-physician practitioner and as supervising physician I was immediately available for consultation/collaboration.  I agree with above. ALew Dawes MD

## 2021-02-25 ENCOUNTER — Other Ambulatory Visit (INDEPENDENT_AMBULATORY_CARE_PROVIDER_SITE_OTHER): Payer: Medicare HMO

## 2021-02-25 DIAGNOSIS — E785 Hyperlipidemia, unspecified: Secondary | ICD-10-CM | POA: Diagnosis not present

## 2021-02-25 DIAGNOSIS — I10 Essential (primary) hypertension: Secondary | ICD-10-CM

## 2021-02-25 DIAGNOSIS — R209 Unspecified disturbances of skin sensation: Secondary | ICD-10-CM

## 2021-02-25 DIAGNOSIS — Z0000 Encounter for general adult medical examination without abnormal findings: Secondary | ICD-10-CM

## 2021-02-25 DIAGNOSIS — M109 Gout, unspecified: Secondary | ICD-10-CM

## 2021-02-25 DIAGNOSIS — E119 Type 2 diabetes mellitus without complications: Secondary | ICD-10-CM

## 2021-02-25 LAB — CBC WITH DIFFERENTIAL/PLATELET
Basophils Absolute: 0 10*3/uL (ref 0.0–0.1)
Basophils Relative: 0.6 % (ref 0.0–3.0)
Eosinophils Absolute: 0.1 10*3/uL (ref 0.0–0.7)
Eosinophils Relative: 2 % (ref 0.0–5.0)
HCT: 41.5 % (ref 39.0–52.0)
Hemoglobin: 13.8 g/dL (ref 13.0–17.0)
Lymphocytes Relative: 26.5 % (ref 12.0–46.0)
Lymphs Abs: 1.1 10*3/uL (ref 0.7–4.0)
MCHC: 33.2 g/dL (ref 30.0–36.0)
MCV: 96.6 fl (ref 78.0–100.0)
Monocytes Absolute: 0.6 10*3/uL (ref 0.1–1.0)
Monocytes Relative: 14.3 % — ABNORMAL HIGH (ref 3.0–12.0)
Neutro Abs: 2.3 10*3/uL (ref 1.4–7.7)
Neutrophils Relative %: 56.6 % (ref 43.0–77.0)
Platelets: 184 10*3/uL (ref 150.0–400.0)
RBC: 4.3 Mil/uL (ref 4.22–5.81)
RDW: 13.7 % (ref 11.5–15.5)
WBC: 4 10*3/uL (ref 4.0–10.5)

## 2021-02-25 LAB — LIPID PANEL
Cholesterol: 117 mg/dL (ref 0–200)
HDL: 49.4 mg/dL (ref >39.00)
LDL Cholesterol: 59 mg/dL (ref 0–99)
NonHDL: 67.47
Total CHOL/HDL Ratio: 2
Triglycerides: 44 mg/dL (ref 0.0–149.0)
VLDL: 8.8 mg/dL (ref 0.0–40.0)

## 2021-02-25 LAB — URINALYSIS
Bilirubin Urine: NEGATIVE
Hgb urine dipstick: NEGATIVE
Ketones, ur: NEGATIVE
Leukocytes,Ua: NEGATIVE
Nitrite: NEGATIVE
Specific Gravity, Urine: >=1.03 — AB (ref 1.000–1.030)
Total Protein, Urine: NEGATIVE
Urine Glucose: NEGATIVE
Urobilinogen, UA: 0.2 (ref 0.0–1.0)
pH: 5.5 (ref 5.0–8.0)

## 2021-02-25 LAB — COMPREHENSIVE METABOLIC PANEL
ALT: 18 U/L (ref 0–53)
AST: 22 U/L (ref 0–37)
Albumin: 4.1 g/dL (ref 3.5–5.2)
Alkaline Phosphatase: 66 U/L (ref 39–117)
BUN: 16 mg/dL (ref 6–23)
CO2: 25 mEq/L (ref 19–32)
Calcium: 9 mg/dL (ref 8.4–10.5)
Chloride: 107 mEq/L (ref 96–112)
Creatinine, Ser: 0.96 mg/dL (ref 0.40–1.50)
GFR: 78.65 mL/min (ref >60.00)
Glucose, Bld: 104 mg/dL — ABNORMAL HIGH (ref 70–99)
Potassium: 3.8 mEq/L (ref 3.5–5.1)
Sodium: 141 mEq/L (ref 135–145)
Total Bilirubin: 0.5 mg/dL (ref 0.2–1.2)
Total Protein: 6.7 g/dL (ref 6.0–8.3)

## 2021-02-25 LAB — HEPATIC FUNCTION PANEL
ALT: 18 U/L (ref 0–53)
AST: 22 U/L (ref 0–37)
Albumin: 4.1 g/dL (ref 3.5–5.2)
Alkaline Phosphatase: 66 U/L (ref 39–117)
Bilirubin, Direct: 0.1 mg/dL (ref 0.0–0.3)
Total Bilirubin: 0.5 mg/dL (ref 0.2–1.2)
Total Protein: 6.7 g/dL (ref 6.0–8.3)

## 2021-02-25 LAB — MICROALBUMIN / CREATININE URINE RATIO
Creatinine,U: 153.3 mg/dL
Microalb Creat Ratio: 0.9 mg/g (ref 0.0–30.0)
Microalb, Ur: 1.4 mg/dL (ref 0.0–1.9)

## 2021-02-25 LAB — BASIC METABOLIC PANEL
BUN: 16 mg/dL (ref 6–23)
CO2: 25 mEq/L (ref 19–32)
Calcium: 9 mg/dL (ref 8.4–10.5)
Chloride: 107 mEq/L (ref 96–112)
Creatinine, Ser: 0.96 mg/dL (ref 0.40–1.50)
GFR: 78.65 mL/min (ref >60.00)
Glucose, Bld: 104 mg/dL — ABNORMAL HIGH (ref 70–99)
Potassium: 3.8 mEq/L (ref 3.5–5.1)
Sodium: 141 mEq/L (ref 135–145)

## 2021-02-25 LAB — TSH: TSH: 2.04 u[IU]/mL (ref 0.35–5.50)

## 2021-02-25 LAB — HEMOGLOBIN A1C: Hgb A1c MFr Bld: 7 % — ABNORMAL HIGH (ref 4.6–6.5)

## 2021-03-04 ENCOUNTER — Ambulatory Visit (INDEPENDENT_AMBULATORY_CARE_PROVIDER_SITE_OTHER): Payer: Medicare HMO | Admitting: Internal Medicine

## 2021-03-04 ENCOUNTER — Encounter: Payer: Self-pay | Admitting: Internal Medicine

## 2021-03-04 ENCOUNTER — Other Ambulatory Visit: Payer: Self-pay

## 2021-03-04 DIAGNOSIS — I10 Essential (primary) hypertension: Secondary | ICD-10-CM

## 2021-03-04 DIAGNOSIS — E119 Type 2 diabetes mellitus without complications: Secondary | ICD-10-CM

## 2021-03-04 DIAGNOSIS — M1000 Idiopathic gout, unspecified site: Secondary | ICD-10-CM

## 2021-03-04 DIAGNOSIS — E785 Hyperlipidemia, unspecified: Secondary | ICD-10-CM

## 2021-03-04 NOTE — Assessment & Plan Note (Signed)
Cont on Actos and Amaryl. Improve diet

## 2021-03-04 NOTE — Progress Notes (Signed)
Subjective:  Patient ID: Jacob Chapman, male    DOB: [birth year]  Age: 75 y.o. MRN: [id number]  CC: Follow-up (3 month f/u)   HPI Jacob Chapman presents for DM, HTN, B12 def  Outpatient Medications Prior to Visit  Medication Sig Dispense Refill   ACCU-CHEK AVIVA PLUS test strip TEST BLOOD SUGAR ONE TIME DAILY 100 strip 3   Accu-Chek Softclix Lancets lancets TEST BLOOD SUGAR EVERY DAY 100 each 3   acyclovir (ZOVIRAX) 400 MG tablet TAKE 1 TABLET  3 TIMES DAILY FOR COLD SORES 21 tablet 3   amLODipine (NORVASC) 5 MG tablet TAKE 1 AND 1/2 TABLETS EVERY DAY 135 tablet 3   aspirin 81 MG EC tablet Take 81 mg by mouth daily.     Blood Glucose Calibration (ACCU-CHEK AVIVA) SOLN Use for blood sugar machine, DX: E11.9 1 each 0   Blood Glucose Monitoring Suppl (ACCU-CHEK AVIVA PLUS) w/Device KIT TEST BLOOD SUGAR EVERY DAY 1 kit 0   cholecalciferol (VITAMIN D) 1000 UNITS tablet daily.     Cyanocobalamin (VITAMIN B12 PO) daily.     glimepiride (AMARYL) 2 MG tablet TAKE 2 TABLETS EVERY DAY BEFORE BREAKFAST 180 tablet 3   losartan (COZAAR) 100 MG tablet TAKE 1 TABLET EVERY DAY 90 tablet 2   lovastatin (MEVACOR) 20 MG tablet TAKE 1 TABLET AT BEDTIME 90 tablet 3   pioglitazone (ACTOS) 45 MG tablet TAKE 1 TABLET EVERY DAY 90 tablet 3   tiZANidine (ZANAFLEX) 2 MG tablet Take 1 tablet (2 mg total) by mouth every 8 (eight) hours as needed for muscle spasms. 15 tablet 0   vitamin B-12 (CYANOCOBALAMIN) 500 MCG tablet Take 500 mcg by mouth daily.     erythromycin ophthalmic ointment Place a 1/2 inch ribbon of ointment into the lower eyelid 4 times daily x 1 week (Patient not taking: Reported on 03/04/2021) 3.5 g 0   No facility-administered medications prior to visit.    ROS: Review of Systems  Constitutional:  Negative for appetite change, fatigue and unexpected weight change.  HENT:  Negative for congestion, nosebleeds, sneezing, sore throat and trouble swallowing.   Eyes:  Negative for itching and  visual disturbance.  Respiratory:  Negative for cough.   Cardiovascular:  Negative for chest pain, palpitations and leg swelling.  Gastrointestinal:  Negative for abdominal distention, blood in stool, diarrhea and nausea.  Genitourinary:  Negative for frequency and hematuria.  Musculoskeletal:  Negative for back pain, gait problem, joint swelling and neck pain.  Skin:  Negative for rash.  Neurological:  Negative for dizziness, tremors, speech difficulty and weakness.  Psychiatric/Behavioral:  Negative for agitation, dysphoric mood and sleep disturbance. The patient is not nervous/anxious.    Objective:  BP 134/80 (BP Location: Left Arm)   Pulse (!) 55   Temp 98 F (36.7 C) (Oral)   Ht 5' 6"  (1.676 m)   Wt 167 lb 6.4 oz (75.9 kg)   SpO2 98%   BMI 27.02 kg/m   BP Readings from Last 3 Encounters:  03/04/21 134/80  02/18/21 122/70  12/03/20 132/86    Wt Readings from Last 3 Encounters:  03/04/21 167 lb 6.4 oz (75.9 kg)  02/18/21 168 lb 6.4 oz (76.4 kg)  12/03/20 170 lb 6.4 oz (77.3 kg)    Physical Exam Constitutional:      General: He is not in acute distress.    Appearance: He is well-developed.     Comments: NAD  Eyes:     Conjunctiva/sclera: Conjunctivae normal.  Pupils: Pupils are equal, round, and reactive to light.  Neck:     Thyroid: No thyromegaly.     Vascular: No JVD.  Cardiovascular:     Rate and Rhythm: Normal rate and regular rhythm.     Heart sounds: Normal heart sounds. No murmur heard.   No friction rub. No gallop.  Pulmonary:     Effort: Pulmonary effort is normal. No respiratory distress.     Breath sounds: Normal breath sounds. No wheezing or rales.  Chest:     Chest wall: No tenderness.  Abdominal:     General: Bowel sounds are normal. There is no distension.     Palpations: Abdomen is soft. There is no mass.     Tenderness: There is no abdominal tenderness. There is no guarding or rebound.  Musculoskeletal:        General: No tenderness.  Normal range of motion.     Cervical back: Normal range of motion.  Lymphadenopathy:     Cervical: No cervical adenopathy.  Skin:    General: Skin is warm and dry.     Findings: No rash.  Neurological:     Mental Status: He is alert and oriented to person, place, and time.     Cranial Nerves: No cranial nerve deficit.     Motor: No abnormal muscle tone.     Coordination: Coordination normal.     Gait: Gait normal.     Deep Tendon Reflexes: Reflexes are normal and symmetric.  Psychiatric:        Behavior: Behavior normal.        Thought Content: Thought content normal.        Judgment: Judgment normal.    Lab Results  Component Value Date   WBC 4.0 02/25/2021   HGB 13.8 02/25/2021   HCT 41.5 02/25/2021   PLT 184.0 02/25/2021   GLUCOSE 104 (H) 02/25/2021   GLUCOSE 104 (H) 02/25/2021   CHOL 117 02/25/2021   TRIG 44.0 02/25/2021   HDL 49.40 02/25/2021   LDLCALC 59 02/25/2021   ALT 18 02/25/2021   ALT 18 02/25/2021   AST 22 02/25/2021   AST 22 02/25/2021   NA 141 02/25/2021   NA 141 02/25/2021   K 3.8 02/25/2021   K 3.8 02/25/2021   CL 107 02/25/2021   CL 107 02/25/2021   CREATININE 0.96 02/25/2021   CREATININE 0.96 02/25/2021   BUN 16 02/25/2021   BUN 16 02/25/2021   CO2 25 02/25/2021   CO2 25 02/25/2021   TSH 2.04 02/25/2021   PSA 0.3 01/30/2016   INR CANCELED 01/18/2014   HGBA1C 7.0 (H) 02/25/2021   MICROALBUR 1.4 02/25/2021    No results found.  Assessment & Plan:   Problem List Items Addressed This Visit     DM2 (diabetes mellitus, type 2) ([place])    Cont on Actos and Amaryl. Improve diet      Relevant Orders   Comprehensive metabolic panel   Hemoglobin A1c   Dyslipidemia    Cont on Lovastatin      Essential hypertension    Cont on Norvasc 5 mg and Losartan 100 mg/d      Gout    Losartan, diet         No orders of the defined types were placed in this encounter.     Follow-up: Return in about 3 months (around 06/04/2021) for a  follow-up visit.  Walker Kehr, MD

## 2021-03-04 NOTE — Assessment & Plan Note (Signed)
Cont on Norvasc 5 mg and Losartan 100 mg/d

## 2021-03-04 NOTE — Assessment & Plan Note (Signed)
Losartan, diet

## 2021-03-04 NOTE — Assessment & Plan Note (Signed)
Cont on Lovastatin 

## 2021-03-26 ENCOUNTER — Other Ambulatory Visit: Payer: Self-pay | Admitting: *Deleted

## 2021-03-26 MED ORDER — BD SWAB SINGLE USE REGULAR PADS: MEDICATED_PAD | 3 refills | Status: DC

## 2021-03-26 MED ORDER — ACCU-CHEK AVIVA VI SOLN: 3 refills | Status: DC

## 2021-04-01 DIAGNOSIS — Z8546 Personal history of malignant neoplasm of prostate: Secondary | ICD-10-CM | POA: Diagnosis not present

## 2021-04-07 DIAGNOSIS — Z8546 Personal history of malignant neoplasm of prostate: Secondary | ICD-10-CM | POA: Diagnosis not present

## 2021-04-07 DIAGNOSIS — N400 Enlarged prostate without lower urinary tract symptoms: Secondary | ICD-10-CM | POA: Diagnosis not present

## 2021-05-27 ENCOUNTER — Other Ambulatory Visit (INDEPENDENT_AMBULATORY_CARE_PROVIDER_SITE_OTHER): Payer: Medicare HMO

## 2021-05-27 DIAGNOSIS — M109 Gout, unspecified: Secondary | ICD-10-CM | POA: Diagnosis not present

## 2021-05-27 DIAGNOSIS — E119 Type 2 diabetes mellitus without complications: Secondary | ICD-10-CM | POA: Diagnosis not present

## 2021-05-27 DIAGNOSIS — I10 Essential (primary) hypertension: Secondary | ICD-10-CM

## 2021-05-27 LAB — BASIC METABOLIC PANEL
BUN: 18 mg/dL (ref 6–23)
CO2: 28 mEq/L (ref 19–32)
Calcium: 9.1 mg/dL (ref 8.4–10.5)
Chloride: 106 mEq/L (ref 96–112)
Creatinine, Ser: 0.98 mg/dL (ref 0.40–1.50)
GFR: 76.59 mL/min (ref >60.00)
Glucose, Bld: 109 mg/dL — ABNORMAL HIGH (ref 70–99)
Potassium: 4.4 mEq/L (ref 3.5–5.1)
Sodium: 139 mEq/L (ref 135–145)

## 2021-05-27 LAB — HEMOGLOBIN A1C: Hgb A1c MFr Bld: 6.7 % — ABNORMAL HIGH (ref 4.6–6.5)

## 2021-06-03 ENCOUNTER — Encounter: Payer: Self-pay | Admitting: Internal Medicine

## 2021-06-03 ENCOUNTER — Ambulatory Visit (INDEPENDENT_AMBULATORY_CARE_PROVIDER_SITE_OTHER): Payer: Medicare HMO | Admitting: Internal Medicine

## 2021-06-03 ENCOUNTER — Other Ambulatory Visit: Payer: Self-pay

## 2021-06-03 DIAGNOSIS — E119 Type 2 diabetes mellitus without complications: Secondary | ICD-10-CM | POA: Diagnosis not present

## 2021-06-03 DIAGNOSIS — M545 Low back pain, unspecified: Secondary | ICD-10-CM | POA: Diagnosis not present

## 2021-06-03 DIAGNOSIS — E785 Hyperlipidemia, unspecified: Secondary | ICD-10-CM | POA: Diagnosis not present

## 2021-06-03 NOTE — Progress Notes (Signed)
° °Subjective:  °Patient ID: Jacob Chapman, male    DOB: 12/13/1945  Age: 76 y.o. MRN: [id number] ° °CC: No chief complaint on file. ° ° °HPI °Momodou A Franklin presents for DM, HTN, LBP ° °Outpatient Medications Prior to Visit  °Medication Sig Dispense Refill  ° ACCU-CHEK AVIVA PLUS test strip TEST BLOOD SUGAR ONE TIME DAILY 100 strip 3  ° Accu-Chek Softclix Lancets lancets TEST BLOOD SUGAR EVERY DAY 100 each 3  ° acyclovir (ZOVIRAX) 400 MG tablet TAKE 1 TABLET  3 TIMES DAILY FOR COLD SORES 21 tablet 3  ° Alcohol Swabs (B-D SINGLE USE SWABS REGULAR) PADS Use as directed to wipe finger to check blood sugars 100 each 3  ° amLODipine (NORVASC) 5 MG tablet TAKE 1 AND 1/2 TABLETS EVERY DAY 135 tablet 3  ° aspirin 81 MG EC tablet Take 81 mg by mouth daily.    ° Blood Glucose Calibration (ACCU-CHEK AVIVA) SOLN Use for blood sugar machine, DX: E11.9 1 each 3  ° Blood Glucose Monitoring Suppl (ACCU-CHEK AVIVA PLUS) w/Device KIT TEST BLOOD SUGAR EVERY DAY 1 kit 0  ° cholecalciferol (VITAMIN D) 1000 UNITS tablet daily.    ° Cyanocobalamin (VITAMIN B12 PO) daily.    ° glimepiride (AMARYL) 2 MG tablet TAKE 2 TABLETS EVERY DAY BEFORE BREAKFAST 180 tablet 3  ° losartan (COZAAR) 100 MG tablet TAKE 1 TABLET EVERY DAY 90 tablet 2  ° lovastatin (MEVACOR) 20 MG tablet TAKE 1 TABLET AT BEDTIME 90 tablet 3  ° pioglitazone (ACTOS) 45 MG tablet TAKE 1 TABLET EVERY DAY 90 tablet 3  ° tiZANidine (ZANAFLEX) 2 MG tablet Take 1 tablet (2 mg total) by mouth every 8 (eight) hours as needed for muscle spasms. 15 tablet 0  ° vitamin B-12 (CYANOCOBALAMIN) 500 MCG tablet Take 500 mcg by mouth daily.    ° °No facility-administered medications prior to visit.  ° ° °ROS: °Review of Systems  °Constitutional:  Negative for appetite change, fatigue and unexpected weight change.  °HENT:  Negative for congestion, nosebleeds, sneezing, sore throat and trouble swallowing.   °Eyes:  Negative for itching and visual disturbance.  °Respiratory:  Negative for  cough.   °Cardiovascular:  Negative for chest pain, palpitations and leg swelling.  °Gastrointestinal:  Negative for abdominal distention, blood in stool, diarrhea and nausea.  °Genitourinary:  Negative for frequency and hematuria.  °Musculoskeletal:  Negative for back pain, gait problem, joint swelling and neck pain.  °Skin:  Negative for rash.  °Neurological:  Negative for dizziness, tremors, speech difficulty and weakness.  °Psychiatric/Behavioral:  Negative for agitation, dysphoric mood, sleep disturbance and suicidal ideas. The patient is not nervous/anxious.   ° °Objective:  °BP 110/60 (BP Location: Left Arm, Patient Position: Sitting, Cuff Size: Large)    Pulse (!) 53    Temp 97.9 °F (36.6 °C) (Oral)    Ht 5' 6" (1.676 m)    Wt 168 lb (76.2 kg)    SpO2 97%    BMI 27.12 kg/m²  ° °BP Readings from Last 3 Encounters:  °06/03/21 110/60  °03/04/21 134/80  °02/18/21 122/70  ° ° °Wt Readings from Last 3 Encounters:  °06/03/21 168 lb (76.2 kg)  °03/04/21 167 lb 6.4 oz (75.9 kg)  °02/18/21 168 lb 6.4 oz (76.4 kg)  ° ° °Physical Exam °Constitutional:   °   General: He is not in acute distress. °   Appearance: He is well-developed.  °   Comments: NAD  °Eyes:  °   Conjunctiva/sclera:   Conjunctiva/sclera: Conjunctivae normal.     Pupils: Pupils are equal, round, and reactive to light.  Neck:     Thyroid: No thyromegaly.     Vascular: No JVD.  Cardiovascular:     Rate and Rhythm: Normal rate and regular rhythm.     Heart sounds: Normal heart sounds. No murmur heard.   No friction rub. No gallop.  Pulmonary:     Effort: Pulmonary effort is normal. No respiratory distress.     Breath sounds: Normal breath sounds. No wheezing or rales.  Chest:     Chest wall: No tenderness.  Abdominal:     General: Bowel sounds are normal. There is no distension.     Palpations: Abdomen is soft. There is no mass.     Tenderness: There is no abdominal tenderness. There is no guarding or rebound.  Musculoskeletal:        General: No tenderness.  Normal range of motion.     Cervical back: Normal range of motion.  Lymphadenopathy:     Cervical: No cervical adenopathy.  Skin:    General: Skin is warm and dry.     Findings: No rash.  Neurological:     Mental Status: He is alert and oriented to person, place, and time.     Cranial Nerves: No cranial nerve deficit.     Motor: No abnormal muscle tone.     Coordination: Coordination normal.     Gait: Gait normal.     Deep Tendon Reflexes: Reflexes are normal and symmetric.  Psychiatric:        Behavior: Behavior normal.        Thought Content: Thought content normal.        Judgment: Judgment normal.    Lab Results  Component Value Date   WBC 4.0 02/25/2021   HGB 13.8 02/25/2021   HCT 41.5 02/25/2021   PLT 184.0 02/25/2021   GLUCOSE 109 (H) 05/27/2021   CHOL 117 02/25/2021   TRIG 44.0 02/25/2021   HDL 49.40 02/25/2021   LDLCALC 59 02/25/2021   ALT 18 02/25/2021   ALT 18 02/25/2021   AST 22 02/25/2021   AST 22 02/25/2021   NA 139 05/27/2021   K 4.4 05/27/2021   CL 106 05/27/2021   CREATININE 0.98 05/27/2021   BUN 18 05/27/2021   CO2 28 05/27/2021   TSH 2.04 02/25/2021   PSA 0.3 01/30/2016   INR CANCELED 01/18/2014   HGBA1C 6.7 (H) 05/27/2021   MICROALBUR 1.4 02/25/2021    No results found.  Assessment & Plan:   Problem List Items Addressed This Visit     DM2 (diabetes mellitus, type 2) ([place])    On Actos and Amaryl Cont on Actos and Amaryl. Improve diet      Relevant Orders   Hemoglobin A1c   Comprehensive metabolic panel   Dyslipidemia    Cont on Lovastatin 20 mg a day      Relevant Orders   Hemoglobin A1c   Comprehensive metabolic panel   LOW BACK PAIN    Ibuprofen prn pc      Relevant Orders   Hemoglobin A1c   Comprehensive metabolic panel      No orders of the defined types were placed in this encounter.     Follow-up: Return in about 3 months (around 08/31/2021) for a follow-up visit.  Walker Kehr, MD

## 2021-06-03 NOTE — Assessment & Plan Note (Signed)
Ibuprofen prn pc

## 2021-06-03 NOTE — Assessment & Plan Note (Signed)
Cont on Lovastatin 20 mg a day

## 2021-06-03 NOTE — Assessment & Plan Note (Signed)
On Actos and Amaryl Cont on Actos and Amaryl. Improve diet

## 2021-08-20 ENCOUNTER — Other Ambulatory Visit: Payer: Self-pay | Admitting: Internal Medicine

## 2021-08-26 ENCOUNTER — Other Ambulatory Visit (INDEPENDENT_AMBULATORY_CARE_PROVIDER_SITE_OTHER): Payer: Medicare HMO

## 2021-08-26 DIAGNOSIS — M545 Low back pain, unspecified: Secondary | ICD-10-CM | POA: Diagnosis not present

## 2021-08-26 DIAGNOSIS — E785 Hyperlipidemia, unspecified: Secondary | ICD-10-CM | POA: Diagnosis not present

## 2021-08-26 DIAGNOSIS — M109 Gout, unspecified: Secondary | ICD-10-CM

## 2021-08-26 DIAGNOSIS — I10 Essential (primary) hypertension: Secondary | ICD-10-CM | POA: Diagnosis not present

## 2021-08-26 DIAGNOSIS — E119 Type 2 diabetes mellitus without complications: Secondary | ICD-10-CM

## 2021-08-26 LAB — COMPREHENSIVE METABOLIC PANEL
ALT: 19 U/L (ref 0–53)
AST: 26 U/L (ref 0–37)
Albumin: 3.8 g/dL (ref 3.5–5.2)
Alkaline Phosphatase: 65 U/L (ref 39–117)
BUN: 16 mg/dL (ref 6–23)
CO2: 24 mEq/L (ref 19–32)
Calcium: 8.9 mg/dL (ref 8.4–10.5)
Chloride: 107 mEq/L (ref 96–112)
Creatinine, Ser: 1.09 mg/dL (ref 0.40–1.50)
GFR: 67.29 mL/min (ref >60.00)
Glucose, Bld: 119 mg/dL — ABNORMAL HIGH (ref 70–99)
Potassium: 4 mEq/L (ref 3.5–5.1)
Sodium: 139 mEq/L (ref 135–145)
Total Bilirubin: 0.6 mg/dL (ref 0.2–1.2)
Total Protein: 6.5 g/dL (ref 6.0–8.3)

## 2021-08-26 LAB — HEMOGLOBIN A1C: Hgb A1c MFr Bld: 6.9 % — ABNORMAL HIGH (ref 4.6–6.5)

## 2021-09-02 ENCOUNTER — Ambulatory Visit: Payer: Medicare HMO | Admitting: Internal Medicine

## 2021-09-23 ENCOUNTER — Ambulatory Visit (INDEPENDENT_AMBULATORY_CARE_PROVIDER_SITE_OTHER): Payer: Medicare HMO | Admitting: Internal Medicine

## 2021-09-23 ENCOUNTER — Encounter: Payer: Self-pay | Admitting: Internal Medicine

## 2021-09-23 DIAGNOSIS — E119 Type 2 diabetes mellitus without complications: Secondary | ICD-10-CM

## 2021-09-23 DIAGNOSIS — E785 Hyperlipidemia, unspecified: Secondary | ICD-10-CM

## 2021-09-23 DIAGNOSIS — I10 Essential (primary) hypertension: Secondary | ICD-10-CM | POA: Diagnosis not present

## 2021-09-23 DIAGNOSIS — M79642 Pain in left hand: Secondary | ICD-10-CM | POA: Diagnosis not present

## 2021-09-23 NOTE — Assessment & Plan Note (Signed)
Cont on Lovastatin 20 mg a day

## 2021-09-23 NOTE — Assessment & Plan Note (Signed)
On Lovastatin  Cont on Actos and Amaryl. Improve diet

## 2021-09-23 NOTE — Assessment & Plan Note (Signed)
Cont on Norvasc 5 mg and Losartan 100 mg/d

## 2021-09-23 NOTE — Assessment & Plan Note (Signed)
New Trigger finger splint 3d finger Blue-Emu cream was --use 2-3 times a day

## 2021-09-23 NOTE — Patient Instructions (Signed)
Trigger finger splint Blue-Emu cream was --use 2-3 times a day

## 2021-09-23 NOTE — Progress Notes (Signed)
Subjective:  Patient ID: Jacob Chapman, male    DOB: Jul 04, 1945  Age: 76 y.o. MRN: [id number]  CC: [id number] up (No concerns)   HPI WRejeana Brockpresents for L hand pain F/u on DM, HTN  Outpatient Medications Prior to Visit  Medication Sig Dispense Refill   ACCU-CHEK AVIVA PLUS test strip TEST BLOOD SUGAR ONE TIME DAILY 100 strip 3   Accu-Chek Softclix Lancets lancets TEST BLOOD SUGAR EVERY DAY 100 each 3   acyclovir (ZOVIRAX) 400 MG tablet TAKE 1 TABLET  3 TIMES DAILY FOR COLD SORES 21 tablet 3   Alcohol Swabs (B-D SINGLE USE SWABS REGULAR) PADS Use as directed to wipe finger to check blood sugars 100 each 3   amLODipine (NORVASC) 5 MG tablet TAKE 1 AND 1/2 TABLETS EVERY DAY Follow-up appt is due must see provider for future refills 135 tablet 0   aspirin 81 MG EC tablet Take 81 mg by mouth daily.     Blood Glucose Calibration (ACCU-CHEK AVIVA) SOLN Use for blood sugar machine, DX: E11.9 1 each 3   Blood Glucose Monitoring Suppl (ACCU-CHEK AVIVA PLUS) w/Device KIT TEST BLOOD SUGAR EVERY DAY 1 kit 0   cholecalciferol (VITAMIN D) 1000 UNITS tablet daily.     Cyanocobalamin (VITAMIN B12 PO) daily.     glimepiride (AMARYL) 2 MG tablet TAKE 2 TABLETS EVERY DAY BEFORE BREAKFAST 180 tablet 3   losartan (COZAAR) 100 MG tablet TAKE 1 TABLET EVERY DAY 90 tablet 2   lovastatin (MEVACOR) 20 MG tablet TAKE 1 TABLET AT BEDTIME 90 tablet 3   pioglitazone (ACTOS) 45 MG tablet TAKE 1 TABLET EVERY DAY 90 tablet 3   tiZANidine (ZANAFLEX) 2 MG tablet Take 1 tablet (2 mg total) by mouth every 8 (eight) hours as needed for muscle spasms. 15 tablet 0   vitamin B-12 (CYANOCOBALAMIN) 500 MCG tablet Take 500 mcg by mouth daily.     No facility-administered medications prior to visit.    ROS: Review of Systems  Constitutional:  Negative for appetite change, fatigue and unexpected weight change.  HENT:  Negative for congestion, nosebleeds, sneezing, sore throat and trouble swallowing.   Eyes:   Negative for itching and visual disturbance.  Respiratory:  Negative for cough.   Cardiovascular:  Negative for chest pain, palpitations and leg swelling.  Gastrointestinal:  Negative for abdominal distention, blood in stool, diarrhea and nausea.  Genitourinary:  Negative for frequency and hematuria.  Musculoskeletal:  Positive for arthralgias. Negative for back pain, gait problem, joint swelling and neck pain.  Skin:  Negative for rash.  Neurological:  Negative for dizziness, tremors, speech difficulty and weakness.  Psychiatric/Behavioral:  Negative for agitation, dysphoric mood, sleep disturbance and suicidal ideas. The patient is not nervous/anxious.    Objective:  BP 114/62 (BP Location: Left Arm, Patient Position: Sitting, Cuff Size: Large)   Pulse (!) 56   Temp 97.9 F (36.6 C) (Oral)   Ht _0  (1.676 m)   Wt 171 lb (77.6 kg)   SpO2 94%   BMI 27.60 kg/m   BP Readings from Last 3 Encounters:  09/23/21 114/62  06/03/21 110/60  03/04/21 134/80    Wt Readings from Last 3 Encounters:  09/23/21 171 lb (77.6 kg)  06/03/21 168 lb (76.2 kg)  03/04/21 167 lb 6.4 oz (75.9 kg)    Physical Exam Constitutional:      General: He is not in acute distress.    Appearance: Normal appearance. He is well-developed.  Comments: NAD  Eyes:     Conjunctiva/sclera: Conjunctivae normal.     Pupils: Pupils are equal, round, and reactive to light.  Neck:     Thyroid: No thyromegaly.     Vascular: No JVD.  Cardiovascular:     Rate and Rhythm: Normal rate and regular rhythm.     Heart sounds: Normal heart sounds. No murmur heard.   No friction rub. No gallop.  Pulmonary:     Effort: Pulmonary effort is normal. No respiratory distress.     Breath sounds: Normal breath sounds. No wheezing or rales.  Chest:     Chest wall: No tenderness.  Abdominal:     General: Bowel sounds are normal. There is no distension.     Palpations: Abdomen is soft. There is no mass.     Tenderness: There  is no abdominal tenderness. There is no guarding or rebound.  Musculoskeletal:        General: No tenderness. Normal range of motion.     Cervical back: Normal range of motion.  Lymphadenopathy:     Cervical: No cervical adenopathy.  Skin:    General: Skin is warm and dry.     Findings: No rash.  Neurological:     Mental Status: He is alert and oriented to person, place, and time.     Cranial Nerves: No cranial nerve deficit.     Motor: No abnormal muscle tone.     Coordination: Coordination normal.     Gait: Gait normal.     Deep Tendon Reflexes: Reflexes are normal and symmetric.  Psychiatric:        Behavior: Behavior normal.        Thought Content: Thought content normal.        Judgment: Judgment normal.   L 3d middle finger palm aspect w/pain Lab Results  Component Value Date   WBC 4.0 02/25/2021   HGB 13.8 02/25/2021   HCT 41.5 02/25/2021   PLT 184.0 02/25/2021   GLUCOSE 119 (H) 08/26/2021   CHOL 117 02/25/2021   TRIG 44.0 02/25/2021   HDL 49.40 02/25/2021   LDLCALC 59 02/25/2021   ALT 19 08/26/2021   AST 26 08/26/2021   NA 139 08/26/2021   K 4.0 08/26/2021   CL 107 08/26/2021   CREATININE 1.09 08/26/2021   BUN 16 08/26/2021   CO2 24 08/26/2021   TSH 2.04 02/25/2021   PSA 0.3 01/30/2016   INR CANCELED 01/18/2014   HGBA1C 6.9 (H) 08/26/2021   MICROALBUR 1.4 02/25/2021    No results found.  Assessment & Plan:   Problem List Items Addressed This Visit     DM2 (diabetes mellitus, type 2) ([place])    On Lovastatin  Cont on Actos and Amaryl. Improve diet      Relevant Orders   Comprehensive metabolic panel   Hemoglobin A1c   Dyslipidemia    Cont on Lovastatin 20 mg a day      Essential hypertension    Cont on Norvasc 5 mg and Losartan 100 mg/d      Hand pain, left    New Trigger finger splint 3d finger Blue-Emu cream was --use 2-3 times a day         No orders of the defined types were placed in this encounter.     Follow-up: No  follow-ups on file.  Walker Kehr, MD

## 2021-10-07 DIAGNOSIS — D170 Benign lipomatous neoplasm of skin and subcutaneous tissue of head, face and neck: Secondary | ICD-10-CM | POA: Diagnosis not present

## 2021-10-07 DIAGNOSIS — L821 Other seborrheic keratosis: Secondary | ICD-10-CM | POA: Diagnosis not present

## 2021-10-07 DIAGNOSIS — L814 Other melanin hyperpigmentation: Secondary | ICD-10-CM | POA: Diagnosis not present

## 2021-10-07 DIAGNOSIS — D225 Melanocytic nevi of trunk: Secondary | ICD-10-CM | POA: Diagnosis not present

## 2021-10-07 DIAGNOSIS — L570 Actinic keratosis: Secondary | ICD-10-CM | POA: Diagnosis not present

## 2021-11-20 ENCOUNTER — Other Ambulatory Visit: Payer: Self-pay | Admitting: Internal Medicine

## 2021-12-10 ENCOUNTER — Other Ambulatory Visit: Payer: Self-pay | Admitting: Internal Medicine

## 2021-12-31 ENCOUNTER — Telehealth: Payer: Self-pay | Admitting: *Deleted

## 2021-12-31 MED ORDER — ACCU-CHEK GUIDE W/DEVICE KIT
PACK | 0 refills | Status: DC
Start: 2021-12-31 — End: 2022-09-21

## 2021-12-31 NOTE — Telephone Encounter (Signed)
Rec'd fax need Accu-chek guide meter sent to mail order.Marland KitchenJohny Chapman

## 2022-01-13 ENCOUNTER — Other Ambulatory Visit (INDEPENDENT_AMBULATORY_CARE_PROVIDER_SITE_OTHER): Payer: Medicare HMO

## 2022-01-13 DIAGNOSIS — R209 Unspecified disturbances of skin sensation: Secondary | ICD-10-CM | POA: Diagnosis not present

## 2022-01-13 DIAGNOSIS — I10 Essential (primary) hypertension: Secondary | ICD-10-CM

## 2022-01-13 DIAGNOSIS — E119 Type 2 diabetes mellitus without complications: Secondary | ICD-10-CM | POA: Diagnosis not present

## 2022-01-13 DIAGNOSIS — M109 Gout, unspecified: Secondary | ICD-10-CM

## 2022-01-13 DIAGNOSIS — E785 Hyperlipidemia, unspecified: Secondary | ICD-10-CM | POA: Diagnosis not present

## 2022-01-13 LAB — BASIC METABOLIC PANEL
BUN: 16 mg/dL (ref 6–23)
CO2: 25 mEq/L (ref 19–32)
Calcium: 8.9 mg/dL (ref 8.4–10.5)
Chloride: 107 mEq/L (ref 96–112)
Creatinine, Ser: 1.15 mg/dL (ref 0.40–1.50)
GFR: 62.93 mL/min (ref >60.00)
Glucose, Bld: 94 mg/dL (ref 70–99)
Potassium: 3.8 mEq/L (ref 3.5–5.1)
Sodium: 140 mEq/L (ref 135–145)

## 2022-01-13 LAB — COMPREHENSIVE METABOLIC PANEL
ALT: 15 U/L (ref 0–53)
AST: 19 U/L (ref 0–37)
Albumin: 4 g/dL (ref 3.5–5.2)
Alkaline Phosphatase: 58 U/L (ref 39–117)
BUN: 16 mg/dL (ref 6–23)
CO2: 25 mEq/L (ref 19–32)
Calcium: 8.9 mg/dL (ref 8.4–10.5)
Chloride: 107 mEq/L (ref 96–112)
Creatinine, Ser: 1.15 mg/dL (ref 0.40–1.50)
GFR: 62.93 mL/min (ref >60.00)
Glucose, Bld: 94 mg/dL (ref 70–99)
Potassium: 3.8 mEq/L (ref 3.5–5.1)
Sodium: 140 mEq/L (ref 135–145)
Total Bilirubin: 0.6 mg/dL (ref 0.2–1.2)
Total Protein: 6.8 g/dL (ref 6.0–8.3)

## 2022-01-13 LAB — LIPID PANEL
Cholesterol: 120 mg/dL (ref 0–200)
HDL: 47.8 mg/dL (ref >39.00)
LDL Cholesterol: 63 mg/dL (ref 0–99)
NonHDL: 72.21
Total CHOL/HDL Ratio: 3
Triglycerides: 44 mg/dL (ref 0.0–149.0)
VLDL: 8.8 mg/dL (ref 0.0–40.0)

## 2022-01-13 LAB — HEPATIC FUNCTION PANEL
ALT: 15 U/L (ref 0–53)
AST: 19 U/L (ref 0–37)
Albumin: 4 g/dL (ref 3.5–5.2)
Alkaline Phosphatase: 58 U/L (ref 39–117)
Bilirubin, Direct: 0.2 mg/dL (ref 0.0–0.3)
Total Bilirubin: 0.6 mg/dL (ref 0.2–1.2)
Total Protein: 6.8 g/dL (ref 6.0–8.3)

## 2022-01-13 LAB — HEMOGLOBIN A1C: Hgb A1c MFr Bld: 6.8 % — ABNORMAL HIGH (ref 4.6–6.5)

## 2022-01-20 ENCOUNTER — Encounter: Payer: Self-pay | Admitting: Internal Medicine

## 2022-01-20 ENCOUNTER — Ambulatory Visit (INDEPENDENT_AMBULATORY_CARE_PROVIDER_SITE_OTHER): Payer: Medicare HMO | Admitting: Internal Medicine

## 2022-01-20 VITALS — BP 132/72 | HR 52 | Temp 97.7°F | Ht 66.0 in | Wt 166.4 lb

## 2022-01-20 DIAGNOSIS — E119 Type 2 diabetes mellitus without complications: Secondary | ICD-10-CM

## 2022-01-20 DIAGNOSIS — I10 Essential (primary) hypertension: Secondary | ICD-10-CM

## 2022-01-20 DIAGNOSIS — E785 Hyperlipidemia, unspecified: Secondary | ICD-10-CM

## 2022-01-20 DIAGNOSIS — Z23 Encounter for immunization: Secondary | ICD-10-CM | POA: Diagnosis not present

## 2022-01-20 DIAGNOSIS — M545 Low back pain, unspecified: Secondary | ICD-10-CM | POA: Diagnosis not present

## 2022-01-20 NOTE — Addendum Note (Signed)
Addended by: Earnstine Regal on: 01/20/2022 10:34 AM   Modules accepted: Orders

## 2022-01-20 NOTE — Assessment & Plan Note (Signed)
Chronic Cont on Norvasc 5 mg and Losartan 100 mg/d Nl BP at home

## 2022-01-20 NOTE — Assessment & Plan Note (Signed)
On Lovastatin CT ca score 37 2020 Cont on Actos and Amaryl. Improve diet

## 2022-01-20 NOTE — Assessment & Plan Note (Signed)
Chronic  Cont on Lovastatin 20 mg a day

## 2022-01-20 NOTE — Progress Notes (Signed)
Subjective:  Patient ID: Jacob Chapman, male    DOB: 1945/06/23  Age: 76 y.o. MRN: [id number]  CC: Follow-up (4 MONTH F/U)   HPI Jacob Chapman presents for DM, HTN, dyslipidemia  Outpatient Medications Prior to Visit  Medication Sig Dispense Refill   ACCU-CHEK AVIVA PLUS test strip TEST BLOOD SUGAR ONE TIME DAILY 100 strip 3   Accu-Chek Softclix Lancets lancets TEST BLOOD SUGAR EVERY DAY Annual appt due in Nov must see provider for future refills 100 each 0   acyclovir (ZOVIRAX) 400 MG tablet TAKE 1 TABLET  3 TIMES DAILY FOR COLD SORES 21 tablet 3   Alcohol Swabs (DROPSAFE ALCOHOL PREP) 70 % PADS USE AS DIRECTED TO WIPE FINGER TO CHECK BLOOD SUGARS EVERY DAY 100 each 0   amLODipine (NORVASC) 5 MG tablet TAKE 1 AND 1/2 TABLETS EVERY DAY Annual appt due in Aug must see provider for future refills 135 tablet 0   aspirin 81 MG EC tablet Take 81 mg by mouth daily.     Blood Glucose Calibration (ACCU-CHEK AVIVA) SOLN Use for blood sugar machine, DX: E11.9 1 each 3   Blood Glucose Monitoring Suppl (ACCU-CHEK AVIVA PLUS) w/Device KIT TEST BLOOD SUGAR EVERY DAY 1 kit 0   Blood Glucose Monitoring Suppl (ACCU-CHEK GUIDE) w/Device KIT Use to check blood sugars twice a day 1 kit 0   cholecalciferol (VITAMIN D) 1000 UNITS tablet daily.     Cyanocobalamin (VITAMIN B12 PO) daily.     glimepiride (AMARYL) 2 MG tablet Take 2 tablets (4 mg total) by mouth daily with breakfast. Annual appt due in Nov must see provider for future refills 180 tablet 0   losartan (COZAAR) 100 MG tablet Take 1 tablet (100 mg total) by mouth daily. Annual appt due in Aug must see provider for future refills 90 tablet 0   lovastatin (MEVACOR) 20 MG tablet Take 1 tablet (20 mg total) by mouth at bedtime. TAKE 1 TABLET AT BEDTIME Annual appt due in Nov must see provider for future refills 90 tablet 0   pioglitazone (ACTOS) 45 MG tablet Take 1 tablet (45 mg total) by mouth daily. TAKE 1 TABLET EVERY DAY Annual appt due in Nov must  see provider for future refills 90 tablet 0   tiZANidine (ZANAFLEX) 2 MG tablet Take 1 tablet (2 mg total) by mouth every 8 (eight) hours as needed for muscle spasms. 15 tablet 0   vitamin B-12 (CYANOCOBALAMIN) 500 MCG tablet Take 500 mcg by mouth daily.     No facility-administered medications prior to visit.    ROS: Review of Systems  Constitutional:  Negative for appetite change, fatigue and unexpected weight change.  HENT:  Negative for congestion, nosebleeds, sneezing, sore throat and trouble swallowing.   Eyes:  Negative for itching and visual disturbance.  Respiratory:  Negative for cough.   Cardiovascular:  Negative for chest pain, palpitations and leg swelling.  Gastrointestinal:  Negative for abdominal distention, blood in stool, diarrhea and nausea.  Genitourinary:  Negative for frequency and hematuria.  Musculoskeletal:  Negative for back pain, gait problem, joint swelling and neck pain.  Skin:  Negative for rash.  Neurological:  Negative for dizziness, tremors, speech difficulty and weakness.  Psychiatric/Behavioral:  Negative for agitation, dysphoric mood and sleep disturbance. The patient is not nervous/anxious.     Objective:  BP 132/72 (BP Location: Left Arm)   Pulse (!) 52   Temp 97.7 F (36.5 C) (Oral)   Ht 5' 6"  (1.676 m)  Wt 166 lb 6.4 oz (75.5 kg)   SpO2 98%   BMI 26.86 kg/m   BP Readings from Last 3 Encounters:  01/20/22 132/72  09/23/21 114/62  06/03/21 110/60    Wt Readings from Last 3 Encounters:  01/20/22 166 lb 6.4 oz (75.5 kg)  09/23/21 171 lb (77.6 kg)  06/03/21 168 lb (76.2 kg)    Physical Exam Constitutional:      General: He is not in acute distress.    Appearance: He is well-developed.     Comments: NAD  Eyes:     Conjunctiva/sclera: Conjunctivae normal.     Pupils: Pupils are equal, round, and reactive to light.  Neck:     Thyroid: No thyromegaly.     Vascular: No JVD.  Cardiovascular:     Rate and Rhythm: Normal rate and  regular rhythm.     Heart sounds: Normal heart sounds. No murmur heard.    No friction rub. No gallop.  Pulmonary:     Effort: Pulmonary effort is normal. No respiratory distress.     Breath sounds: Normal breath sounds. No wheezing or rales.  Chest:     Chest wall: No tenderness.  Abdominal:     General: Bowel sounds are normal. There is no distension.     Palpations: Abdomen is soft. There is no mass.     Tenderness: There is no abdominal tenderness. There is no guarding or rebound.  Musculoskeletal:        General: No tenderness. Normal range of motion.     Cervical back: Normal range of motion.  Lymphadenopathy:     Cervical: No cervical adenopathy.  Skin:    General: Skin is warm and dry.     Findings: No rash.  Neurological:     Mental Status: He is alert and oriented to person, place, and time.     Cranial Nerves: No cranial nerve deficit.     Motor: No abnormal muscle tone.     Coordination: Coordination normal.     Gait: Gait normal.     Deep Tendon Reflexes: Reflexes are normal and symmetric.  Psychiatric:        Behavior: Behavior normal.        Thought Content: Thought content normal.        Judgment: Judgment normal.     Lab Results  Component Value Date   WBC 4.0 02/25/2021   HGB 13.8 02/25/2021   HCT 41.5 02/25/2021   PLT 184.0 02/25/2021   GLUCOSE 94 01/13/2022   GLUCOSE 94 01/13/2022   CHOL 120 01/13/2022   TRIG 44.0 01/13/2022   HDL 47.80 01/13/2022   LDLCALC 63 01/13/2022   ALT 15 01/13/2022   ALT 15 01/13/2022   AST 19 01/13/2022   AST 19 01/13/2022   NA 140 01/13/2022   NA 140 01/13/2022   K 3.8 01/13/2022   K 3.8 01/13/2022   CL 107 01/13/2022   CL 107 01/13/2022   CREATININE 1.15 01/13/2022   CREATININE 1.15 01/13/2022   BUN 16 01/13/2022   BUN 16 01/13/2022   CO2 25 01/13/2022   CO2 25 01/13/2022   TSH 2.04 02/25/2021   PSA 0.3 01/30/2016   INR CANCELED 01/18/2014   HGBA1C 6.8 (H) 01/13/2022   MICROALBUR 1.4 02/25/2021    No  results found.  Assessment & Plan:   Problem List Items Addressed This Visit     DM2 (diabetes mellitus, type 2) ([place])    On Lovastatin CT ca score  37 2020 Cont on Actos and Amaryl. Improve diet      Relevant Orders   Hemoglobin A1c   Comprehensive metabolic panel   Dyslipidemia    Chronic  Cont on Lovastatin 20 mg a day      Essential hypertension    Chronic Cont on Norvasc 5 mg and Losartan 100 mg/d Nl BP at home      Relevant Orders   Hemoglobin A1c   Comprehensive metabolic panel   LOW BACK PAIN      No orders of the defined types were placed in this encounter.     Follow-up: Return in about 3 months (around 04/22/2022) for a follow-up visit.  Walker Kehr, MD

## 2022-02-21 ENCOUNTER — Other Ambulatory Visit: Payer: Self-pay | Admitting: Internal Medicine

## 2022-02-24 ENCOUNTER — Ambulatory Visit (INDEPENDENT_AMBULATORY_CARE_PROVIDER_SITE_OTHER): Payer: Medicare HMO

## 2022-02-24 VITALS — BP 122/60 | HR 52 | Temp 97.8°F | Ht 66.0 in | Wt 167.6 lb

## 2022-02-24 DIAGNOSIS — Z0000 Encounter for general adult medical examination without abnormal findings: Secondary | ICD-10-CM | POA: Diagnosis not present

## 2022-02-24 DIAGNOSIS — Z23 Encounter for immunization: Secondary | ICD-10-CM | POA: Diagnosis not present

## 2022-02-24 NOTE — Patient Instructions (Addendum)
Mr. Jacob Chapman , Thank you for taking time to come for your Medicare Wellness Visit. I appreciate your ongoing commitment to your health goals. Please review the following plan we discussed and let me know if I can assist you in the future.   These are the goals we discussed:  Goals      Client will verbalize knowledge of diabetes self-management as evidenced by Hgb A1C <7 or as defined by provider.     I would like to lose 5 pounds.        This is a list of the screening recommended for you and due dates:  Health Maintenance  Topic Date Due   COVID-19 Vaccine (2 - [place] risk series) 09/27/2019   Complete foot exam   03/26/2021   Eye exam for diabetics  12/16/2021   Yearly kidney health urinalysis for diabetes  02/25/2022   Hepatitis C Screening: USPSTF Recommendation to screen - Ages 18-79 yo.  09/24/2022*   Hemoglobin A1C  07/14/2022   Yearly kidney function blood test for diabetes  01/14/2023   Medicare Annual Wellness Visit  02/25/2023   Tetanus Vaccine  10/25/2023   Colon Cancer Screening  08/20/2025   Pneumonia Vaccine  Completed   Flu Shot  Completed   Zoster (Shingles) Vaccine  Completed   HPV Vaccine  Aged Out  *Topic was postponed. The date shown is not the original due date.    Advanced directives: Yes  Conditions/risks identified: Yes; Type II Diabetes  Next appointment: Follow up in one year for your annual wellness visit.   Preventive Care 61 Years and Older, Male  Preventive care refers to lifestyle choices and visits with your health care provider that can promote health and wellness. What does preventive care include? A yearly physical exam. This is also called an annual well check. Dental exams once or twice a year. Routine eye exams. Ask your health care provider how often you should have your eyes checked. Personal lifestyle choices, including: Daily care of your teeth and gums. Regular physical activity. Eating a healthy diet. Avoiding tobacco and  drug use. Limiting alcohol use. Practicing safe sex. Taking low doses of aspirin every day. Taking vitamin and mineral supplements as recommended by your health care provider. What happens during an annual well check? The services and screenings done by your health care provider during your annual well check will depend on your age, overall health, lifestyle risk factors, and family history of disease. Counseling  Your health care provider may ask you questions about your: Alcohol use. Tobacco use. Drug use. Emotional well-being. Home and relationship well-being. Sexual activity. Eating habits. History of falls. Memory and ability to understand (cognition). Work and work Statistician. Screening  You may have the following tests or measurements: Height, weight, and BMI. Blood pressure. Lipid and cholesterol levels. These may be checked every 5 years, or more frequently if you are over 20 years old. Skin check. Lung cancer screening. You may have this screening every year starting at age 47 if you have a 30-pack-year history of smoking and currently smoke or have quit within the past 15 years. Fecal occult blood test (FOBT) of the stool. You may have this test every year starting at age 44. Flexible sigmoidoscopy or colonoscopy. You may have a sigmoidoscopy every 5 years or a colonoscopy every 10 years starting at age 36. Prostate cancer screening. Recommendations will vary depending on your family history and other risks. Hepatitis C blood test. Hepatitis B blood test. Sexually  transmitted disease (STD) testing. Diabetes screening. This is done by checking your blood sugar (glucose) after you have not eaten for a while (fasting). You may have this done every 1-3 years. Abdominal aortic aneurysm (AAA) screening. You may need this if you are a current or former smoker. Osteoporosis. You may be screened starting at age 3 if you are at high risk. Talk with your health care provider  about your test results, treatment options, and if necessary, the need for more tests. Vaccines  Your health care provider may recommend certain vaccines, such as: Influenza vaccine. This is recommended every year. Tetanus, diphtheria, and acellular pertussis (Tdap, Td) vaccine. You may need a Td booster every 10 years. Zoster vaccine. You may need this after age 9. Pneumococcal 13-valent conjugate (PCV13) vaccine. One dose is recommended after age 68. Pneumococcal polysaccharide (PPSV23) vaccine. One dose is recommended after age 33. Talk to your health care provider about which screenings and vaccines you need and how often you need them. This information is not intended to replace advice given to you by your health care provider. Make sure you discuss any questions you have with your health care provider. Document Released: 05/03/2015 Document Revised: 12/25/2015 Document Reviewed: 02/05/2015 Elsevier Interactive Patient Education  2017 [place] Prevention in the Home Falls can cause injuries. They can happen to people of all ages. There are many things you can do to make your home safe and to help prevent falls. What can I do on the outside of my home? Regularly fix the edges of walkways and driveways and fix any cracks. Remove anything that might make you trip as you walk through a door, such as a raised step or threshold. Trim any bushes or trees on the path to your home. Use bright outdoor lighting. Clear any walking paths of anything that might make someone trip, such as rocks or tools. Regularly check to see if handrails are loose or broken. Make sure that both sides of any steps have handrails. Any raised decks and porches should have guardrails on the edges. Have any leaves, snow, or ice cleared regularly. Use sand or salt on walking paths during winter. Clean up any spills in your garage right away. This includes oil or grease spills. What can I do in the  bathroom? Use night lights. Install grab bars by the toilet and in the tub and shower. Do not use towel bars as grab bars. Use non-skid mats or decals in the tub or shower. If you need to sit down in the shower, use a plastic, non-slip stool. Keep the floor dry. Clean up any water that spills on the floor as soon as it happens. Remove soap buildup in the tub or shower regularly. Attach bath mats securely with double-sided non-slip rug tape. Do not have throw rugs and other things on the floor that can make you trip. What can I do in the bedroom? Use night lights. Make sure that you have a light by your bed that is easy to reach. Do not use any sheets or blankets that are too big for your bed. They should not hang down onto the floor. Have a firm chair that has side arms. You can use this for support while you get dressed. Do not have throw rugs and other things on the floor that can make you trip. What can I do in the kitchen? Clean up any spills right away. Avoid walking on wet floors. Keep items that you use  a lot in easy-to-reach places. If you need to reach something above you, use a strong step stool that has a grab bar. Keep electrical cords out of the way. Do not use floor polish or wax that makes floors slippery. If you must use wax, use non-skid floor wax. Do not have throw rugs and other things on the floor that can make you trip. What can I do with my stairs? Do not leave any items on the stairs. Make sure that there are handrails on both sides of the stairs and use them. Fix handrails that are broken or loose. Make sure that handrails are as long as the stairways. Check any carpeting to make sure that it is firmly attached to the stairs. Fix any carpet that is loose or worn. Avoid having throw rugs at the top or bottom of the stairs. If you do have throw rugs, attach them to the floor with carpet tape. Make sure that you have a light switch at the top of the stairs and the  bottom of the stairs. If you do not have them, ask someone to add them for you. What else can I do to help prevent falls? Wear shoes that: Do not have high heels. Have rubber bottoms. Are comfortable and fit you well. Are closed at the toe. Do not wear sandals. If you use a stepladder: Make sure that it is fully opened. Do not climb a closed stepladder. Make sure that both sides of the stepladder are locked into place. Ask someone to hold it for you, if possible. Clearly mark and make sure that you can see: Any grab bars or handrails. First and last steps. Where the edge of each step is. Use tools that help you move around (mobility aids) if they are needed. These include: Canes. Walkers. Scooters. Crutches. Turn on the lights when you go into a dark area. Replace any light bulbs as soon as they burn out. Set up your furniture so you have a clear path. Avoid moving your furniture around. If any of your floors are uneven, fix them. If there are any pets around you, be aware of where they are. Review your medicines with your doctor. Some medicines can make you feel dizzy. This can increase your chance of falling. Ask your doctor what other things that you can do to help prevent falls. This information is not intended to replace advice given to you by your health care provider. Make sure you discuss any questions you have with your health care provider. Document Released: 01/31/2009 Document Revised: 09/12/2015 Document Reviewed: 05/11/2014 Elsevier Interactive Patient Education  2017 [employer].

## 2022-02-24 NOTE — Progress Notes (Addendum)
Subjective:   Jacob Chapman is a 76 y.o. male who presents for Medicare Annual/Subsequent preventive examination.  Review of Systems     Cardiac Risk Factors include: advanced age (>76mn, >>83women);hypertension;male gender;diabetes mellitus;dyslipidemia     Objective:    Today's Vitals   02/24/22 1038  BP: 122/60  Pulse: (!) 52  Temp: 97.8 F (36.6 C)  SpO2: 96%  Weight: 167 lb 9.6 oz (76 kg)  Height: _0  (1.676 m)  PainSc: 0-No pain   Body mass index is 27.05 kg/m.     02/18/2021   11:01 AM 01/29/2020   12:04 PM 01/24/2019   10:36 AM 08/07/2015    3:55 PM 11/06/2011    6:07 AM  Advanced Directives  Does Patient Have a Medical Advance Directive? Yes Yes Yes No Patient does not have advance directive  Type of Advance Directive Living will;Healthcare Power of [place]    Does patient want to make changes to medical advance directive? No - Patient declined No - Patient declined     [place] of [place]? No - copy requested  No - copy requested    Pre-existing out of facility DNR order (yellow form or pink MOST form)     No    Current Medications (verified) Outpatient Encounter Medications as of 02/24/2022  Medication Sig   Accu-Chek Softclix Lancets lancets TEST BLOOD SUGAR EVERY DAY Annual appt due in Nov must see provider for future refills   acyclovir (ZOVIRAX) 400 MG tablet TAKE 1 TABLET  3 TIMES DAILY FOR COLD SORES   Alcohol Swabs (DROPSAFE ALCOHOL PREP) 70 % PADS USE AS DIRECTED TO WIPE FINGER TO CHECK BLOOD SUGARS EVERY DAY   amLODipine (NORVASC) 5 MG tablet TAKE 1 AND 1/2 TABLETS EVERY DAY Annual appt due in Aug must see provider for future refills   aspirin 81 MG EC tablet Take 81 mg by mouth daily.   Blood Glucose Calibration (ACCU-CHEK AVIVA) SOLN Use for blood sugar machine, DX: E11.9   Blood Glucose Monitoring Suppl (ACCU-CHEK AVIVA PLUS) w/Device KIT TEST BLOOD SUGAR EVERY DAY   Blood  Glucose Monitoring Suppl (ACCU-CHEK GUIDE) w/Device KIT Use to check blood sugars twice a day   cholecalciferol (VITAMIN D) 1000 UNITS tablet daily.   Cyanocobalamin (VITAMIN B12 PO) daily.   glimepiride (AMARYL) 2 MG tablet Take 2 tablets (4 mg total) by mouth daily with breakfast. Annual appt due in Nov must see provider for future refills   glucose blood (ACCU-CHEK AVIVA PLUS) test strip TEST BLOOD SUGAR ONE TIME DAILY   losartan (COZAAR) 100 MG tablet Take 1 tablet (100 mg total) by mouth daily. Annual appt due in Aug must see provider for future refills   lovastatin (MEVACOR) 20 MG tablet Take 1 tablet (20 mg total) by mouth at bedtime. TAKE 1 TABLET AT BEDTIME Annual appt due in Nov must see provider for future refills   pioglitazone (ACTOS) 45 MG tablet Take 1 tablet (45 mg total) by mouth daily. TAKE 1 TABLET EVERY DAY Annual appt due in Nov must see provider for future refills   tiZANidine (ZANAFLEX) 2 MG tablet Take 1 tablet (2 mg total) by mouth every 8 (eight) hours as needed for muscle spasms.   vitamin B-12 (CYANOCOBALAMIN) 500 MCG tablet Take 500 mcg by mouth daily.   No facility-administered encounter medications on file as of 02/24/2022.    Allergies (verified) Metformin   History: Past Medical  History:  Diagnosis Date   Bulging lumbar disc    no current prob   Chronic lower back pain    Hx - no current prob   Diabetes mellitus, type 2 ([place])    type II   History of gout stable -- last bout 3 yrs ago   no current prob   HTN (hypertension)    Hyperlipidemia    Prostate cancer ([place]) 09/01/11   dx Adenocarcinoma   Past Surgical History:  Procedure Laterality Date   COLONOSCOPY  2005, 2017   CYSTOSCOPY  11/06/2011   Procedure: CYSTOSCOPY FLEXIBLE;  Surgeon: Claybon Jabs, MD;  Location: [facility];  Service: Urology;  Laterality: N/A;  no seeds found in bladder   PROSTATE BIOPSY  09/01/2011   DR OTTELIN'S OFFICE   gleason 3+3=6,volume=21cc,PSA=6.73    RADIOACTIVE SEED IMPLANT  11/06/2011   Procedure: RADIOACTIVE SEED IMPLANT;  Surgeon: Claybon Jabs, MD;  Location: [facility];  Service: Urology;  Laterality: Bilateral;  10 seeds implanted   VASECTOMY     Family History  Adopted: Yes  Problem Relation Age of Onset   Stroke Mother        22   Cancer Father 60       lung ca   Heart disease Sister 6       rhematic fever   Hypertension Other 1       maternal 1st cousin,leukemia deceased   Colon cancer Neg Hx    Colon polyps Neg Hx    Esophageal cancer Neg Hx    Rectal cancer Neg Hx    Stomach cancer Neg Hx    Social History   Socioeconomic History   Marital status: Widowed    Spouse name: Not on file   Number of children: 1   Years of education: Not on file   Highest education level: Not on file  Occupational History   Occupation: Retired works part-time for [employer]: [employer]    Comment: Loss adjuster, chartered for [place] Use   Smoking status: Never   Smokeless tobacco: Never  Scientific laboratory technician Use: Never used  Substance and Sexual Activity   Alcohol use: No    Alcohol/week: 0.0 standard drinks of alcohol   Drug use: No   Sexual activity: Not Currently  Other Topics Concern   Not on file  Social History Narrative   Regular exercise - YES         Social Determinants of Health   Financial Resource Strain: Low Risk  (02/24/2022)   Overall Financial Resource Strain (CARDIA)    Difficulty of Paying Living Expenses: Not hard at all  Food Insecurity: No Food Insecurity (02/24/2022)   Hunger Vital Sign    Worried About Running Out of Food in the Last Year: Never true    [place] in the Last Year: Never true  Transportation Needs: No Transportation Needs (02/24/2022)   PRAPARE - Hydrologist (Medical): No    Lack of Transportation (Non-Medical): No  Physical Activity: Sufficiently Active (02/24/2022)   Exercise Vital Sign    Days of Exercise per Week:  7 days    Minutes of Exercise per Session: 30 min  Stress: No Stress Concern Present (02/24/2022)   [place]    Feeling of Stress : Not at all  Social Connections: Moderately Integrated (02/24/2022)   Social Connection and  Isolation Panel [NHANES]    Frequency of Communication with Friends and Family: More than three times a week    Frequency of Social Gatherings with Friends and Family: More than three times a week    Attends Religious Services: More than 4 times per year    Active Member of [employer] or Organizations: Yes    Attends Archivist Meetings: More than 4 times per year    Marital Status: Widowed    Tobacco Counseling Counseling given: Not Answered   Clinical Intake:  Pre-visit preparation completed: Yes  Pain : No/denies pain Pain Score: 0-No pain     BMI - recorded: 27.05 Nutritional Status: BMI 25 -29 Overweight Nutritional Risks: None Diabetes: Yes CBG done?: No Did pt. bring in CBG monitor from home?: No  How often do you need to have someone help you when you read instructions, pamphlets, or other written materials from your doctor or pharmacy?: 1 - Never What is the last grade level you completed in school?: HSG  Nutrition Risk Assessment:  Has the patient had any N/V/D within the last 2 months?  No  Does the patient have any non-healing wounds?  No  Has the patient had any unintentional weight loss or weight gain?  No   Diabetes:  Is the patient diabetic?  Yes  If diabetic, was a CBG obtained today?  Yes  124 Did the patient bring in their glucometer from home?  No  How often do you monitor your CBG's? Once a day.   Financial Strains and Diabetes Management:  Are you having any financial strains with the device, your supplies or your medication? No .  Does the patient want to be seen by Chronic Care Management for management of their diabetes?  No  Would the patient like  to be referred to a Nutritionist or for Diabetic Management?  No   Diabetic Exams:  Diabetic Eye Exam: Completed Scheduled for 03/02/2022 with Dr. Anastasio Auerbach Diabetic Foot Exam: Overdue, Pt has been advised about the importance in completing this exam. Pt is scheduled for diabetic foot exam on 05/19/2022.   Interpreter Needed?: No  Information entered by :: Lisette Abu, LPN.   Activities of Daily Living    02/24/2022   10:41 AM  In your present state of health, do you have any difficulty performing the following activities:  Hearing? 0  Vision? 0  Difficulty concentrating or making decisions? 0  Walking or climbing stairs? 0  Dressing or bathing? 0  Doing errands, shopping? 0  Preparing Food and eating ? N  Using the Toilet? N  In the past six months, have you accidently leaked urine? N  Do you have problems with loss of bowel control? N  Managing your Medications? N  Managing your Finances? N  Housekeeping or managing your Housekeeping? N    Patient Care Team: Plotnikov, Evie Lacks, MD as PCP - Jeannie Fend, MD as Consulting Physician (Ophthalmology) Remi Haggard, MD as Consulting Physician (Urology)  Indicate any recent Medical Services you may have received from other than [facility] providers in the past year (date may be approximate).     Assessment:   This is a routine wellness examination for Krist.  Hearing/Vision screen No results found.  Dietary issues and exercise activities discussed: Current Exercise Habits: Home exercise routine, Time (Minutes): 30, Frequency (Times/Week): 7, Weekly Exercise (Minutes/Week): 210   Goals Addressed             This Visit's Progress  Client will verbalize knowledge of diabetes self-management as evidenced by Hgb A1C <7 or as defined by provider.       I would like to lose 5 pounds.      Depression Screen    02/24/2022   10:40 AM 01/20/2022   10:06 AM 09/23/2021    9:31 AM 02/18/2021   10:38 AM  01/29/2020   11:59 AM 01/24/2019   10:00 AM 10/26/2017    9:50 AM  PHQ 2/9 Scores  PHQ - 2 Score 0 0 0 0 0 0 0  PHQ- 9 Score 0 0 0        Fall Risk    01/20/2022   10:06 AM 09/23/2021    9:32 AM 02/18/2021   10:38 AM 01/29/2020   12:04 PM 01/24/2019   10:00 AM  Fall Risk   Falls in the past year? 0 0 0 0 0  Number falls in past yr: 0  0 0   Injury with Fall? 0  0 0   Risk for fall due to : No Fall Risks  No Fall Risks No Fall Risks   Follow up   Falls evaluation completed Falls evaluation completed;Education provided Falls evaluation completed    [place]:  Any stairs in or around the home? No  If so, are there any without handrails? No  Home free of loose throw rugs in walkways, pet beds, electrical cords, etc? Yes  Adequate lighting in your home to reduce risk of falls? Yes   ASSISTIVE DEVICES UTILIZED TO PREVENT FALLS:  Life alert? No  Use of a cane, walker or w/c? No  Grab bars in the bathroom? No  Shower chair or bench in shower? No  Elevated toilet seat or a handicapped toilet? No   TIMED UP AND GO:  Was the test performed? Yes .  Length of time to ambulate 10 feet: 6 sec.   Gait steady and fast without use of assistive device  Cognitive Function:        02/24/2022   10:41 AM 01/29/2020   12:06 PM  6CIT Screen  What Year? 0 points 0 points  What month? 0 points 0 points  What time? 0 points 0 points  Count back from 20 0 points 0 points  Months in reverse 0 points 0 points  Repeat phrase 0 points 0 points  Total Score 0 points 0 points    Immunizations Immunization History  Administered Date(s) Administered   Fluad Quad(high Dose 65+) 01/24/2019, 01/29/2020, 02/18/2021   Influenza, High Dose Seasonal PF 02/17/2017, 03/08/2018   Janssen (J&J) SARS-COV-2 Vaccination 08/30/2019   PNEUMOCOCCAL CONJUGATE-20 01/20/2022   Pneumococcal Conjugate-13 10/08/2015   Pneumococcal Polysaccharide-23 09/29/2016   Td 10/24/2013    Zoster Recombinat (Shingrix) 03/28/2020, 05/20/2020   Zoster, Live 05/06/2009    TDAP status: Up to date  Flu Vaccine status: Completed at today's visit  Pneumococcal vaccine status: Up to date  Covid-19 vaccine status: Completed vaccines  Qualifies for Shingles Vaccine? Yes   Zostavax completed Yes   Shingrix Completed?: Yes  Screening Tests Health Maintenance  Topic Date Due   COVID-19 Vaccine (2 - Janssen risk series) 09/27/2019   FOOT EXAM  03/26/2021   OPHTHALMOLOGY EXAM  12/16/2021   Diabetic kidney evaluation - Urine ACR  02/25/2022   INFLUENZA VACCINE  02/24/2022 (Originally 11/18/2021)   Hepatitis C Screening  09/24/2022 (Originally 01/23/1964)   HEMOGLOBIN A1C  07/14/2022   Diabetic kidney evaluation - GFR  measurement  01/14/2023   Medicare Annual Wellness (AWV)  02/25/2023   TETANUS/TDAP  10/25/2023   COLONOSCOPY (Pts 45-15yr Insurance coverage will need to be confirmed)  08/20/2025   Pneumonia Vaccine 76 Years old  Completed   Zoster Vaccines- Shingrix  Completed   HPV VACCINES  Aged Out    Health Maintenance  Health Maintenance Due  Topic Date Due   COVID-19 Vaccine (2 - Janssen risk series) 09/27/2019   FOOT EXAM  03/26/2021   OPHTHALMOLOGY EXAM  12/16/2021   Diabetic kidney evaluation - Urine ACR  02/25/2022    Colorectal cancer screening: Type of screening: Colonoscopy. Completed 08/21/2015. Repeat every 10 years  Lung Cancer Screening: (Low Dose CT Chest recommended if Age 74-80years, 30 pack-year currently smoking OR have quit w/in 15years.) does not qualify.   Lung Cancer Screening Referral: no  Additional Screening:  Hepatitis C Screening: does not qualify; Completed no  Vision Screening: Recommended annual ophthalmology exams for early detection of glaucoma and other disorders of the eye. Is the patient up to date with their annual eye exam?  Yes  Who is the provider or what is the name of the office in which the patient attends annual eye  exams? GKaty Apo MD. If pt is not established with a provider, would they like to be referred to a provider to establish care? No .   Dental Screening: Recommended annual dental exams for proper oral hygiene  Community Resource Referral / Chronic Care Management: CRR required this visit?  No   CCM required this visit?  No      Plan:     I have personally reviewed and noted the following in the patient's chart:   Medical and social history Use of alcohol, tobacco or illicit drugs  Current medications and supplements including opioid prescriptions. Patient is not currently taking opioid prescriptions. Functional ability and status Nutritional status Physical activity Advanced directives List of other physicians Hospitalizations, surgeries, and ER visits in previous 12 months Vitals Screenings to include cognitive, depression, and falls Referrals and appointments  In addition, I have reviewed and discussed with patient certain preventive protocols, quality metrics, and best practice recommendations. A written personalized care plan for preventive services as well as general preventive health recommendations were provided to patient.     SSheral Flow LPN   125/11/5275  Nurse Notes: N/A  Medical screening examination/treatment/procedure(s) were performed by non-physician practitioner and as supervising physician I was immediately available for consultation/collaboration.  I agree with above. ALew Dawes MD

## 2022-03-02 DIAGNOSIS — H25013 Cortical age-related cataract, bilateral: Secondary | ICD-10-CM | POA: Diagnosis not present

## 2022-03-02 DIAGNOSIS — H5213 Myopia, bilateral: Secondary | ICD-10-CM | POA: Diagnosis not present

## 2022-03-02 DIAGNOSIS — E119 Type 2 diabetes mellitus without complications: Secondary | ICD-10-CM | POA: Diagnosis not present

## 2022-03-02 DIAGNOSIS — H2513 Age-related nuclear cataract, bilateral: Secondary | ICD-10-CM | POA: Diagnosis not present

## 2022-03-04 ENCOUNTER — Ambulatory Visit (INDEPENDENT_AMBULATORY_CARE_PROVIDER_SITE_OTHER): Payer: Medicare HMO | Admitting: Internal Medicine

## 2022-03-04 ENCOUNTER — Encounter: Payer: Self-pay | Admitting: Internal Medicine

## 2022-03-04 VITALS — BP 110/72 | HR 50 | Temp 97.6°F | Ht 66.0 in | Wt 167.0 lb

## 2022-03-04 DIAGNOSIS — R454 Irritability and anger: Secondary | ICD-10-CM | POA: Diagnosis not present

## 2022-03-04 MED ORDER — ESCITALOPRAM OXALATE 5 MG PO TABS: 5.0000 mg | ORAL_TABLET | Freq: Every day | ORAL | 5 refills | Status: DC

## 2022-03-04 MED ORDER — ALPRAZOLAM 0.25 MG PO TABS: 0.2500 mg | ORAL_TABLET | Freq: Two times a day (BID) | ORAL | 2 refills | Status: DC | PRN

## 2022-03-04 NOTE — Progress Notes (Signed)
Subjective:  Patient ID: Jacob Chapman, male    DOB: [birth year]  Age: 76 y.o. MRN: [id number]  CC: anger issues   HPI Jacob Chapman presents for anxiety, anger - chronic issues for many years Pt tends to keep feelings to himself; c/o occasional outbursts. No road rage   Outpatient Medications Prior to Visit  Medication Sig Dispense Refill   Accu-Chek Softclix Lancets lancets TEST BLOOD SUGAR EVERY DAY Annual appt due in Nov must see provider for future refills 100 each 0   acyclovir (ZOVIRAX) 400 MG tablet TAKE 1 TABLET  3 TIMES DAILY FOR COLD SORES 21 tablet 3   Alcohol Swabs (DROPSAFE ALCOHOL PREP) 70 % PADS USE AS DIRECTED TO WIPE FINGER TO CHECK BLOOD SUGARS EVERY DAY 100 each 0   amLODipine (NORVASC) 5 MG tablet TAKE 1 AND 1/2 TABLETS EVERY DAY Annual appt due in Aug must see provider for future refills 135 tablet 0   aspirin 81 MG EC tablet Take 81 mg by mouth daily.     Blood Glucose Calibration (ACCU-CHEK AVIVA) SOLN Use for blood sugar machine, DX: E11.9 1 each 3   Blood Glucose Monitoring Suppl (ACCU-CHEK AVIVA PLUS) w/Device KIT TEST BLOOD SUGAR EVERY DAY 1 kit 0   Blood Glucose Monitoring Suppl (ACCU-CHEK GUIDE) w/Device KIT Use to check blood sugars twice a day 1 kit 0   cholecalciferol (VITAMIN D) 1000 UNITS tablet daily.     Cyanocobalamin (VITAMIN B12 PO) daily.     glimepiride (AMARYL) 2 MG tablet Take 2 tablets (4 mg total) by mouth daily with breakfast. Annual appt due in Nov must see provider for future refills 180 tablet 0   glucose blood (ACCU-CHEK AVIVA PLUS) test strip TEST BLOOD SUGAR ONE TIME DAILY 100 strip 3   losartan (COZAAR) 100 MG tablet Take 1 tablet (100 mg total) by mouth daily. Annual appt due in Aug must see provider for future refills 90 tablet 0   lovastatin (MEVACOR) 20 MG tablet Take 1 tablet (20 mg total) by mouth at bedtime. TAKE 1 TABLET AT BEDTIME Annual appt due in Nov must see provider for future refills 90 tablet 0   pioglitazone  (ACTOS) 45 MG tablet Take 1 tablet (45 mg total) by mouth daily. TAKE 1 TABLET EVERY DAY Annual appt due in Nov must see provider for future refills 90 tablet 0   tiZANidine (ZANAFLEX) 2 MG tablet Take 1 tablet (2 mg total) by mouth every 8 (eight) hours as needed for muscle spasms. 15 tablet 0   vitamin B-12 (CYANOCOBALAMIN) 500 MCG tablet Take 500 mcg by mouth daily.     No facility-administered medications prior to visit.    ROS: Review of Systems  Constitutional:  Negative for appetite change, fatigue and unexpected weight change.  HENT:  Negative for congestion, nosebleeds, sneezing, sore throat and trouble swallowing.   Eyes:  Negative for itching and visual disturbance.  Respiratory:  Negative for cough.   Cardiovascular:  Negative for chest pain, palpitations and leg swelling.  Gastrointestinal:  Negative for abdominal distention, blood in stool, diarrhea and nausea.  Genitourinary:  Negative for frequency and hematuria.  Musculoskeletal:  Negative for back pain, gait problem, joint swelling and neck pain.  Skin:  Negative for rash.  Neurological:  Negative for dizziness, tremors, speech difficulty and weakness.  Hematological:  Does not bruise/bleed easily.  Psychiatric/Behavioral:  Negative for agitation, behavioral problems, confusion, decreased concentration, dysphoric mood, self-injury, sleep disturbance and suicidal ideas. The patient is  nervous/anxious. The patient is not hyperactive.     Objective:  BP 110/72 (BP Location: Right Arm, Patient Position: Sitting, Cuff Size: Normal)   Pulse (!) 50   Temp 97.6 F (36.4 C) (Oral)   Ht _0  (1.676 m)   Wt 167 lb (75.8 kg)   SpO2 99%   BMI 26.95 kg/m   BP Readings from Last 3 Encounters:  03/04/22 110/72  02/24/22 122/60  01/20/22 132/72    Wt Readings from Last 3 Encounters:  03/04/22 167 lb (75.8 kg)  02/24/22 167 lb 9.6 oz (76 kg)  01/20/22 166 lb 6.4 oz (75.5 kg)    Physical Exam Constitutional:       General: He is not in acute distress.    Appearance: Normal appearance. He is well-developed.     Comments: NAD  Eyes:     Conjunctiva/sclera: Conjunctivae normal.     Pupils: Pupils are equal, round, and reactive to light.  Neck:     Thyroid: No thyromegaly.     Vascular: No JVD.  Cardiovascular:     Rate and Rhythm: Normal rate and regular rhythm.     Heart sounds: Normal heart sounds. No murmur heard.    No friction rub. No gallop.  Pulmonary:     Effort: Pulmonary effort is normal. No respiratory distress.     Breath sounds: Normal breath sounds. No wheezing or rales.  Chest:     Chest wall: No tenderness.  Abdominal:     General: Bowel sounds are normal. There is no distension.     Palpations: Abdomen is soft. There is no mass.     Tenderness: There is no abdominal tenderness. There is no guarding or rebound.  Musculoskeletal:        General: No tenderness. Normal range of motion.     Cervical back: Normal range of motion.  Lymphadenopathy:     Cervical: No cervical adenopathy.  Skin:    General: Skin is warm and dry.     Findings: No rash.  Neurological:     Mental Status: He is alert and oriented to person, place, and time.     Cranial Nerves: No cranial nerve deficit.     Motor: No abnormal muscle tone.     Coordination: Coordination normal.     Gait: Gait normal.     Deep Tendon Reflexes: Reflexes are normal and symmetric.  Psychiatric:        Behavior: Behavior normal.        Thought Content: Thought content normal.        Judgment: Judgment normal.   Not suicidal, not homicidal A/o/c    A total time of 30 minutes was spent preparing to see the patient, reviewing tests and other medical records.  Also, obtaining history and performing comprehensive phychological exam.  Additionally, counseling the patient regarding the above listed issues.   Finally, documenting clinical information in the health records, coordination of care, educating the patient - anger  management, anxiety   Lab Results  Component Value Date   WBC 4.0 02/25/2021   HGB 13.8 02/25/2021   HCT 41.5 02/25/2021   PLT 184.0 02/25/2021   GLUCOSE 94 01/13/2022   GLUCOSE 94 01/13/2022   CHOL 120 01/13/2022   TRIG 44.0 01/13/2022   HDL 47.80 01/13/2022   LDLCALC 63 01/13/2022   ALT 15 01/13/2022   ALT 15 01/13/2022   AST 19 01/13/2022   AST 19 01/13/2022   NA 140 01/13/2022   NA  140 01/13/2022   K 3.8 01/13/2022   K 3.8 01/13/2022   CL 107 01/13/2022   CL 107 01/13/2022   CREATININE 1.15 01/13/2022   CREATININE 1.15 01/13/2022   BUN 16 01/13/2022   BUN 16 01/13/2022   CO2 25 01/13/2022   CO2 25 01/13/2022   TSH 2.04 02/25/2021   PSA 0.3 01/30/2016   INR CANCELED 01/18/2014   HGBA1C 6.8 (H) 01/13/2022   MICROALBUR 1.4 02/25/2021    No results found.  Assessment & Plan:   Problem List Items Addressed This Visit     Irritability and anger - Primary    New complaint. Chronic Inner emotions primarily  Not suicidal or homicidal Will try Lexapro at hs Try Alprazolam prn  Potential benefits of a long term opioids use as well as potential risks (i.e. addiction risk, apnea etc) and complications (i.e. Somnolence, constipation and others) were explained to the patient and were aknowledged.           Meds ordered this encounter  Medications   escitalopram (LEXAPRO) 5 MG tablet    Sig: Take 1 tablet (5 mg total) by mouth at bedtime.    Dispense:  30 tablet    Refill:  5   ALPRAZolam (XANAX) 0.25 MG tablet    Sig: Take 1 tablet (0.25 mg total) by mouth 2 (two) times daily as needed for anxiety.    Dispense:  60 tablet    Refill:  2      Follow-up: Return for a follow-up visit.  Walker Kehr, MD

## 2022-03-04 NOTE — Assessment & Plan Note (Addendum)
New complaint. Chronic Inner emotions primarily  Not suicidal or homicidal Will try Lexapro at hs Try Alprazolam prn  Potential benefits of a long term opioids use as well as potential risks (i.e. addiction risk, apnea etc) and complications (i.e. Somnolence, constipation and others) were explained to the patient and were aknowledged.

## 2022-03-26 ENCOUNTER — Other Ambulatory Visit: Payer: Self-pay | Admitting: Internal Medicine

## 2022-03-30 DIAGNOSIS — Z8546 Personal history of malignant neoplasm of prostate: Secondary | ICD-10-CM | POA: Diagnosis not present

## 2022-03-30 DIAGNOSIS — S92001A Unspecified fracture of right calcaneus, initial encounter for closed fracture: Secondary | ICD-10-CM | POA: Diagnosis not present

## 2022-03-31 DIAGNOSIS — S92001A Unspecified fracture of right calcaneus, initial encounter for closed fracture: Secondary | ICD-10-CM | POA: Diagnosis not present

## 2022-04-01 ENCOUNTER — Other Ambulatory Visit: Payer: Self-pay | Admitting: Orthopaedic Surgery

## 2022-04-01 ENCOUNTER — Ambulatory Visit
Admission: RE | Admit: 2022-04-01 | Discharge: 2022-04-01 | Disposition: A | Discharge status: Home / Self Care | Payer: Medicare HMO | Source: Ambulatory Visit | Attending: Orthopaedic Surgery | Admitting: Orthopaedic Surgery

## 2022-04-01 DIAGNOSIS — M7989 Other specified soft tissue disorders: Secondary | ICD-10-CM | POA: Diagnosis not present

## 2022-04-01 DIAGNOSIS — M79604 Pain in right leg: Secondary | ICD-10-CM

## 2022-04-01 DIAGNOSIS — S92001A Unspecified fracture of right calcaneus, initial encounter for closed fracture: Secondary | ICD-10-CM | POA: Diagnosis not present

## 2022-04-15 ENCOUNTER — Other Ambulatory Visit: Payer: Self-pay | Admitting: Internal Medicine

## 2022-04-17 ENCOUNTER — Other Ambulatory Visit: Payer: Self-pay | Admitting: *Deleted

## 2022-04-17 ENCOUNTER — Other Ambulatory Visit: Payer: Self-pay | Admitting: Internal Medicine

## 2022-04-17 NOTE — Telephone Encounter (Signed)
Rec'd fax pt is requesting refill on alprazolam to be sent to [facility].Marland KitchenJohny Chess

## 2022-04-20 MED ORDER — ALPRAZOLAM 0.25 MG PO TABS: 0.2500 mg | ORAL_TABLET | Freq: Two times a day (BID) | ORAL | 2 refills | Status: AC | PRN

## 2022-04-21 MED ORDER — ESCITALOPRAM OXALATE 5 MG PO TABS: ORAL_TABLET | ORAL | 3 refills | Status: DC

## 2022-04-21 NOTE — Addendum Note (Signed)
Addended by: Earnstine Regal on: 04/21/2022 11:29 AM   Modules accepted: Orders

## 2022-04-30 ENCOUNTER — Other Ambulatory Visit: Payer: Self-pay | Admitting: Internal Medicine

## 2022-05-01 DIAGNOSIS — M79671 Pain in right foot: Secondary | ICD-10-CM | POA: Diagnosis not present

## 2022-05-19 ENCOUNTER — Ambulatory Visit: Payer: Medicare HMO | Admitting: Internal Medicine

## 2022-05-27 DIAGNOSIS — M79671 Pain in right foot: Secondary | ICD-10-CM | POA: Diagnosis not present

## 2022-06-01 DIAGNOSIS — C61 Malignant neoplasm of prostate: Secondary | ICD-10-CM | POA: Diagnosis not present

## 2022-06-01 DIAGNOSIS — N400 Enlarged prostate without lower urinary tract symptoms: Secondary | ICD-10-CM | POA: Diagnosis not present

## 2022-06-03 ENCOUNTER — Other Ambulatory Visit: Payer: Self-pay | Admitting: Internal Medicine

## 2022-06-22 ENCOUNTER — Other Ambulatory Visit (INDEPENDENT_AMBULATORY_CARE_PROVIDER_SITE_OTHER): Payer: Medicare HMO

## 2022-06-22 DIAGNOSIS — M109 Gout, unspecified: Secondary | ICD-10-CM

## 2022-06-22 DIAGNOSIS — I10 Essential (primary) hypertension: Secondary | ICD-10-CM

## 2022-06-22 DIAGNOSIS — E119 Type 2 diabetes mellitus without complications: Secondary | ICD-10-CM | POA: Diagnosis not present

## 2022-06-22 LAB — BASIC METABOLIC PANEL
BUN: 17 mg/dL (ref 6–23)
CO2: 26 mEq/L (ref 19–32)
Calcium: 8.8 mg/dL (ref 8.4–10.5)
Chloride: 108 mEq/L (ref 96–112)
Creatinine, Ser: 0.89 mg/dL (ref 0.40–1.50)
GFR: 84.48 mL/min (ref >60.00)
Glucose, Bld: 85 mg/dL (ref 70–99)
Potassium: 3.7 mEq/L (ref 3.5–5.1)
Sodium: 143 mEq/L (ref 135–145)

## 2022-06-22 LAB — HEMOGLOBIN A1C: Hgb A1c MFr Bld: 6.3 % (ref 4.6–6.5)

## 2022-06-30 ENCOUNTER — Ambulatory Visit: Payer: Medicare HMO | Admitting: Internal Medicine

## 2022-07-06 ENCOUNTER — Other Ambulatory Visit (INDEPENDENT_AMBULATORY_CARE_PROVIDER_SITE_OTHER): Payer: Medicare HMO

## 2022-07-06 DIAGNOSIS — S92001A Unspecified fracture of right calcaneus, initial encounter for closed fracture: Secondary | ICD-10-CM | POA: Diagnosis not present

## 2022-07-06 DIAGNOSIS — E119 Type 2 diabetes mellitus without complications: Secondary | ICD-10-CM

## 2022-07-06 DIAGNOSIS — I10 Essential (primary) hypertension: Secondary | ICD-10-CM

## 2022-07-06 DIAGNOSIS — R209 Unspecified disturbances of skin sensation: Secondary | ICD-10-CM | POA: Diagnosis not present

## 2022-07-06 DIAGNOSIS — E785 Hyperlipidemia, unspecified: Secondary | ICD-10-CM

## 2022-07-06 LAB — COMPREHENSIVE METABOLIC PANEL
ALT: 13 U/L (ref 0–53)
AST: 17 U/L (ref 0–37)
Albumin: 3.7 g/dL (ref 3.5–5.2)
Alkaline Phosphatase: 82 U/L (ref 39–117)
BUN: 16 mg/dL (ref 6–23)
CO2: 26 mEq/L (ref 19–32)
Calcium: 8.9 mg/dL (ref 8.4–10.5)
Chloride: 108 mEq/L (ref 96–112)
Creatinine, Ser: 0.92 mg/dL (ref 0.40–1.50)
GFR: 81.98 mL/min (ref >60.00)
Glucose, Bld: 87 mg/dL (ref 70–99)
Potassium: 4.3 mEq/L (ref 3.5–5.1)
Sodium: 140 mEq/L (ref 135–145)
Total Bilirubin: 0.6 mg/dL (ref 0.2–1.2)
Total Protein: 6.3 g/dL (ref 6.0–8.3)

## 2022-07-06 LAB — LIPID PANEL
Cholesterol: 123 mg/dL (ref 0–200)
HDL: 46.6 mg/dL (ref >39.00)
LDL Cholesterol: 66 mg/dL (ref 0–99)
NonHDL: 76.25
Total CHOL/HDL Ratio: 3
Triglycerides: 53 mg/dL (ref 0.0–149.0)
VLDL: 10.6 mg/dL (ref 0.0–40.0)

## 2022-07-06 LAB — HEMOGLOBIN A1C: Hgb A1c MFr Bld: 6.3 % (ref 4.6–6.5)

## 2022-07-14 ENCOUNTER — Ambulatory Visit (INDEPENDENT_AMBULATORY_CARE_PROVIDER_SITE_OTHER): Payer: Medicare HMO | Admitting: Internal Medicine

## 2022-07-14 ENCOUNTER — Encounter: Payer: Self-pay | Admitting: Internal Medicine

## 2022-07-14 VITALS — BP 122/78 | HR 75 | Temp 97.7°F | Ht 66.0 in | Wt 168.0 lb

## 2022-07-14 DIAGNOSIS — S92009A Unspecified fracture of unspecified calcaneus, initial encounter for closed fracture: Secondary | ICD-10-CM | POA: Insufficient documentation

## 2022-07-14 DIAGNOSIS — E119 Type 2 diabetes mellitus without complications: Secondary | ICD-10-CM

## 2022-07-14 DIAGNOSIS — R454 Irritability and anger: Secondary | ICD-10-CM

## 2022-07-14 DIAGNOSIS — R609 Edema, unspecified: Secondary | ICD-10-CM | POA: Diagnosis not present

## 2022-07-14 DIAGNOSIS — S92001D Unspecified fracture of right calcaneus, subsequent encounter for fracture with routine healing: Secondary | ICD-10-CM | POA: Diagnosis not present

## 2022-07-14 LAB — COMPREHENSIVE METABOLIC PANEL
ALT: 12 U/L (ref 0–53)
AST: 17 U/L (ref 0–37)
Albumin: 4 g/dL (ref 3.5–5.2)
Alkaline Phosphatase: 88 U/L (ref 39–117)
BUN: 18 mg/dL (ref 6–23)
CO2: 27 mEq/L (ref 19–32)
Calcium: 9.1 mg/dL (ref 8.4–10.5)
Chloride: 106 mEq/L (ref 96–112)
Creatinine, Ser: 0.94 mg/dL (ref 0.40–1.50)
GFR: 79.88 mL/min (ref >60.00)
Glucose, Bld: 77 mg/dL (ref 70–99)
Potassium: 3.8 mEq/L (ref 3.5–5.1)
Sodium: 140 mEq/L (ref 135–145)
Total Bilirubin: 0.6 mg/dL (ref 0.2–1.2)
Total Protein: 6.6 g/dL (ref 6.0–8.3)

## 2022-07-14 LAB — HEMOGLOBIN A1C: Hgb A1c MFr Bld: 6.3 % (ref 4.6–6.5)

## 2022-07-14 MED ORDER — FUROSEMIDE 20 MG PO TABS: 20.0000 mg | ORAL_TABLET | Freq: Every day | ORAL | 3 refills | Status: DC | PRN

## 2022-07-14 NOTE — Assessment & Plan Note (Signed)
Continue with  Lexapro at hs Alprazolam prn - not taking  Potential benefits of a long term opioids use as well as potential risks (i.e. addiction risk, apnea etc) and complications (i.e. Somnolence, constipation and others) were explained to the patient and were aknowledged.

## 2022-07-14 NOTE — Assessment & Plan Note (Addendum)
S/p recent R heel fx - 03/2022 - [facility] Residual edema Compression socks Elevate leg Blue-Emu cream was recommended to use 2-3 times a day

## 2022-07-14 NOTE — Progress Notes (Signed)
Subjective:  Patient ID: Jacob Chapman, male    DOB: 1945/10/22  Age: 77 y.o. MRN: [id number]  CC: Follow-up (3 mnth f/u)   HPI Jacob Chapman presents for DM, HTN, CAD S/p recent R heel fx - 03/2022  Outpatient Medications Prior to Visit  Medication Sig Dispense Refill   Accu-Chek Softclix Lancets lancets TEST BLOOD SUGAR EVERY DAY (ANNUAL APPOINTMENT DUE IN NOVEMBER MUST SEE PROVIDER FOR FUTURE REFILLS) 100 each 3   acyclovir (ZOVIRAX) 400 MG tablet TAKE 1 TABLET  3 TIMES DAILY FOR COLD SORES 21 tablet 3   Alcohol Swabs (DROPSAFE ALCOHOL PREP) 70 % PADS USE AS DIRECTED TO WIPE FINGER TO CHECK BLOOD SUGARS EVERY DAY (APPT DUE IN [place]. SEE MD FOR FUTURE REFILLS) 100 each 3   ALPRAZolam (XANAX) 0.25 MG tablet Take 1 tablet (0.25 mg total) by mouth 2 (two) times daily as needed for anxiety. 60 tablet 2   amLODipine (NORVASC) 5 MG tablet TAKE 1 AND 1/2 TABLETS EVERY DAY (ANNUAL APPOINTMENT DUE IN [place]. MUST SEE PROVIDER FOR FUTURE REFILLS) 135 tablet 3   aspirin 81 MG EC tablet Take 81 mg by mouth daily.     Blood Glucose Calibration (ACCU-CHEK AVIVA) SOLN USE AS DIRECTED WITH GLUCOSE METER 1 each 3   Blood Glucose Monitoring Suppl (ACCU-CHEK AVIVA PLUS) w/Device KIT TEST BLOOD SUGAR EVERY DAY 1 kit 0   Blood Glucose Monitoring Suppl (ACCU-CHEK GUIDE) w/Device KIT Use to check blood sugars twice a day 1 kit 0   cholecalciferol (VITAMIN D) 1000 UNITS tablet daily.     Cyanocobalamin (VITAMIN B12 PO) daily.     escitalopram (LEXAPRO) 5 MG tablet TAKE 1 TABLET BY MOUTH EVERYDAY AT BEDTIME 90 tablet 3   glimepiride (AMARYL) 2 MG tablet TAKE 2 TABLETS DAILY WITH BREAKFAST (MUST SEE PROVIDER AT ANNUAL NOVEMBER APPT FOR FUTURE REFILLS) 180 tablet 3   glucose blood (ACCU-CHEK AVIVA PLUS) test strip TEST BLOOD SUGAR ONE TIME DAILY 100 strip 3   losartan (COZAAR) 100 MG tablet TAKE 1 TABLET EVERY DAY (ANNUAL APPOINTMENT DUE IN [place]. MUST SEE PROVIDER FOR FUTURE REFILLS) 90 tablet 3    lovastatin (MEVACOR) 20 MG tablet TAKE 1 TABLET AT BEDTIME (ANNUAL APPOINTMENT DUE IN NOVEMBER MUST SEE PROVIDER FOR FUTURE REFILLS) 90 tablet 3   pioglitazone (ACTOS) 45 MG tablet TAKE 1 TABLET EVERY DAY (ANNUAL APPOINTMENT DUE IN NOVEMBER, MUST SEE PROVIDER FOR FUTURE REFILLS) 90 tablet 3   tiZANidine (ZANAFLEX) 2 MG tablet Take 1 tablet (2 mg total) by mouth every 8 (eight) hours as needed for muscle spasms. 15 tablet 0   traMADol (ULTRAM) 50 MG tablet Take 50 mg by mouth every 6 (six) hours as needed.     vitamin B-12 (CYANOCOBALAMIN) 500 MCG tablet Take 500 mcg by mouth daily.     No facility-administered medications prior to visit.    ROS: Review of Systems  Constitutional:  Negative for appetite change, fatigue and unexpected weight change.  HENT:  Negative for congestion, nosebleeds, sneezing, sore throat and trouble swallowing.   Eyes:  Negative for itching and visual disturbance.  Respiratory:  Negative for cough.   Cardiovascular:  Positive for leg swelling. Negative for chest pain and palpitations.  Gastrointestinal:  Negative for abdominal distention, blood in stool, diarrhea and nausea.  Genitourinary:  Negative for frequency and hematuria.  Musculoskeletal:  Negative for back pain, gait problem, joint swelling and neck pain.  Skin:  Negative for rash.  Neurological:  Negative for  dizziness, tremors, speech difficulty and weakness.  Psychiatric/Behavioral:  Negative for agitation, dysphoric mood and sleep disturbance. The patient is nervous/anxious.     Objective:  BP 122/78 (BP Location: Right Arm, Patient Position: Sitting, Cuff Size: Large)   Pulse 75   Temp 97.7 F (36.5 C) (Oral)   Ht 5\' 6"  (1.676 m)   Wt 168 lb (76.2 kg)   SpO2 98%   BMI 27.12 kg/m   BP Readings from Last 3 Encounters:  07/14/22 122/78  03/04/22 110/72  02/24/22 122/60    Wt Readings from Last 3 Encounters:  07/14/22 168 lb (76.2 kg)  03/04/22 167 lb (75.8 kg)  02/24/22 167 lb 9.6 oz  (76 kg)    Physical Exam Constitutional:      General: He is not in acute distress.    Appearance: He is well-developed.     Comments: NAD  Eyes:     Conjunctiva/sclera: Conjunctivae normal.     Pupils: Pupils are equal, round, and reactive to light.  Neck:     Thyroid: No thyromegaly.     Vascular: No JVD.  Cardiovascular:     Rate and Rhythm: Normal rate and regular rhythm.     Heart sounds: Normal heart sounds. No murmur heard.    No friction rub. No gallop.  Pulmonary:     Effort: Pulmonary effort is normal. No respiratory distress.     Breath sounds: Normal breath sounds. No wheezing or rales.  Chest:     Chest wall: No tenderness.  Abdominal:     General: Bowel sounds are normal. There is no distension.     Palpations: Abdomen is soft. There is no mass.     Tenderness: There is no abdominal tenderness. There is no guarding or rebound.  Musculoskeletal:        General: No tenderness. Normal range of motion.     Cervical back: Normal range of motion.  Lymphadenopathy:     Cervical: No cervical adenopathy.  Skin:    General: Skin is warm and dry.     Findings: No rash.  Neurological:     Mental Status: He is alert and oriented to person, place, and time.     Cranial Nerves: No cranial nerve deficit.     Motor: No abnormal muscle tone.     Coordination: Coordination normal.     Gait: Gait normal.     Deep Tendon Reflexes: Reflexes are normal and symmetric.  Psychiatric:        Behavior: Behavior normal.        Thought Content: Thought content normal.        Judgment: Judgment normal.     R leg edema up to the knee 1+, indurated skin   Lab Results  Component Value Date   WBC 4.0 02/25/2021   HGB 13.8 02/25/2021   HCT 41.5 02/25/2021   PLT 184.0 02/25/2021   GLUCOSE 77 07/14/2022   CHOL 123 07/06/2022   TRIG 53.0 07/06/2022   HDL 46.60 07/06/2022   LDLCALC 66 07/06/2022   ALT 12 07/14/2022   AST 17 07/14/2022   NA 140 07/14/2022   K 3.8 07/14/2022    CL 106 07/14/2022   CREATININE 0.94 07/14/2022   BUN 18 07/14/2022   CO2 27 07/14/2022   TSH 2.04 02/25/2021   PSA 0.3 01/30/2016   INR CANCELED 01/18/2014   HGBA1C 6.3 07/14/2022   MICROALBUR 1.4 02/25/2021    CT FOOT RIGHT WO CONTRAST  Result Date: 04/02/2022  CLINICAL DATA:  Status post fall from a ladder 2 days ago. EXAM: CT OF THE RIGHT FOOT WITHOUT CONTRAST TECHNIQUE: Multidetector CT imaging of the right foot was performed according to the standard protocol. Multiplanar CT image reconstructions were also generated. RADIATION DOSE REDUCTION: This exam was performed according to the departmental dose-optimization program which includes automated exposure control, adjustment of the mA and/or kV according to patient size and/or use of iterative reconstruction technique. COMPARISON:  None Available. FINDINGS: Bones/Joint/Cartilage Comminuted fracture of the mid calcaneus without significant depression or malalignment. Nondisplaced fracture of the talar neck and anterior process of the talus. Oblique fracture cleft extending from the mid calcaneus to the posterior calcaneus at the Achilles insertion. Small fracture cleft extends to the periphery of the posterior subtalar joint without depression or distraction. No other acute fracture or dislocation. Small plantar calcaneal spur.  Normal alignment. No joint effusion. Ligaments Ligaments are suboptimally evaluated by CT. Muscles and Tendons Muscles are normal. No muscle atrophy. No intramuscular fluid collection or hematoma. Flexor, extensor, peroneal and Achilles tendons are intact. Soft tissue No fluid collection or hematoma. No soft tissue mass. Soft tissue swelling around the ankle. IMPRESSION: 1. Comminuted fracture of the mid calcaneus without significant depression or malalignment. Nondisplaced fracture of the talar neck and anterior process of the talus. Oblique fracture cleft extending from the mid calcaneus to the posterior calcaneus at the  Achilles insertion. Small fracture cleft extends to the periphery of the posterior subtalar joint without depression or distraction. Electronically Signed   By: Kathreen Devoid M.D.   On: 04/02/2022 09:50    Assessment & Plan:   Problem List Items Addressed This Visit       Endocrine   DM2 (diabetes mellitus, type 2) ([place])    Cont on Actos and Amaryl.         Musculoskeletal and Integument   Heel bone fracture    S/p recent R heel fx - 03/2022 - [facility] Residual edema Compression socks Elevate leg Blue-Emu cream was recommended to use 2-3 times a day         Other   Irritability and anger    Continue with  Lexapro at hs Alprazolam prn - not taking  Potential benefits of a long term opioids use as well as potential risks (i.e. addiction risk, apnea etc) and complications (i.e. Somnolence, constipation and others) were explained to the patient and were aknowledged.      Edema - Primary    RLE Check D dimer Lasix prn      Relevant Orders   D-dimer, quantitative      Meds ordered this encounter  Medications   furosemide (LASIX) 20 MG tablet    Sig: Take 1-2 tablets (20-40 mg total) by mouth daily as needed for edema.    Dispense:  30 tablet    Refill:  3      Follow-up: Return in about 3 months (around 10/14/2022) for a follow-up visit.  Walker Kehr, MD

## 2022-07-14 NOTE — Assessment & Plan Note (Signed)
Cont on Actos and Amaryl.

## 2022-07-14 NOTE — Assessment & Plan Note (Addendum)
RLE Check D dimer Lasix prn

## 2022-07-15 ENCOUNTER — Other Ambulatory Visit: Payer: Self-pay | Admitting: Internal Medicine

## 2022-07-15 DIAGNOSIS — S92001D Unspecified fracture of right calcaneus, subsequent encounter for fracture with routine healing: Secondary | ICD-10-CM

## 2022-07-15 DIAGNOSIS — R609 Edema, unspecified: Secondary | ICD-10-CM

## 2022-07-15 LAB — D-DIMER, QUANTITATIVE: D-Dimer, Quant: 0.79 mcg/mL FEU — ABNORMAL HIGH (ref <0.50)

## 2022-07-21 ENCOUNTER — Other Ambulatory Visit: Payer: Self-pay | Admitting: Internal Medicine

## 2022-07-23 ENCOUNTER — Encounter (HOSPITAL_COMMUNITY): Payer: Self-pay

## 2022-09-07 DIAGNOSIS — S92001A Unspecified fracture of right calcaneus, initial encounter for closed fracture: Secondary | ICD-10-CM | POA: Diagnosis not present

## 2022-09-21 ENCOUNTER — Other Ambulatory Visit: Payer: Self-pay | Admitting: Internal Medicine

## 2022-09-24 ENCOUNTER — Other Ambulatory Visit: Payer: Self-pay | Admitting: *Deleted

## 2022-09-24 MED ORDER — FUROSEMIDE 20 MG PO TABS: 20.0000 mg | ORAL_TABLET | Freq: Every day | ORAL | 0 refills | Status: DC | PRN

## 2022-10-12 ENCOUNTER — Other Ambulatory Visit (INDEPENDENT_AMBULATORY_CARE_PROVIDER_SITE_OTHER): Payer: Medicare HMO

## 2022-10-12 DIAGNOSIS — E785 Hyperlipidemia, unspecified: Secondary | ICD-10-CM | POA: Diagnosis not present

## 2022-10-12 DIAGNOSIS — R209 Unspecified disturbances of skin sensation: Secondary | ICD-10-CM | POA: Diagnosis not present

## 2022-10-12 DIAGNOSIS — E119 Type 2 diabetes mellitus without complications: Secondary | ICD-10-CM | POA: Diagnosis not present

## 2022-10-12 DIAGNOSIS — I10 Essential (primary) hypertension: Secondary | ICD-10-CM | POA: Diagnosis not present

## 2022-10-12 LAB — BASIC METABOLIC PANEL
BUN: 17 mg/dL (ref 6–23)
CO2: 26 mEq/L (ref 19–32)
Calcium: 8.9 mg/dL (ref 8.4–10.5)
Chloride: 106 mEq/L (ref 96–112)
Creatinine, Ser: 0.97 mg/dL (ref 0.40–1.50)
GFR: 76.79 mL/min (ref >60.00)
Glucose, Bld: 120 mg/dL — ABNORMAL HIGH (ref 70–99)
Potassium: 3.9 mEq/L (ref 3.5–5.1)
Sodium: 139 mEq/L (ref 135–145)

## 2022-10-12 LAB — HEPATIC FUNCTION PANEL
ALT: 13 U/L (ref 0–53)
AST: 17 U/L (ref 0–37)
Albumin: 3.8 g/dL (ref 3.5–5.2)
Alkaline Phosphatase: 69 U/L (ref 39–117)
Bilirubin, Direct: 0.1 mg/dL (ref 0.0–0.3)
Total Bilirubin: 0.5 mg/dL (ref 0.2–1.2)
Total Protein: 6.5 g/dL (ref 6.0–8.3)

## 2022-10-12 LAB — LIPID PANEL
Cholesterol: 117 mg/dL (ref 0–200)
HDL: 37.2 mg/dL — ABNORMAL LOW (ref >39.00)
LDL Cholesterol: 65 mg/dL (ref 0–99)
NonHDL: 80.12
Total CHOL/HDL Ratio: 3
Triglycerides: 78 mg/dL (ref 0.0–149.0)
VLDL: 15.6 mg/dL (ref 0.0–40.0)

## 2022-10-12 LAB — HEMOGLOBIN A1C: Hgb A1c MFr Bld: 6.8 % — ABNORMAL HIGH (ref 4.6–6.5)

## 2022-10-13 ENCOUNTER — Ambulatory Visit: Payer: Medicare HMO | Admitting: Internal Medicine

## 2022-10-13 DIAGNOSIS — L309 Dermatitis, unspecified: Secondary | ICD-10-CM | POA: Diagnosis not present

## 2022-10-13 DIAGNOSIS — L821 Other seborrheic keratosis: Secondary | ICD-10-CM | POA: Diagnosis not present

## 2022-10-13 DIAGNOSIS — S30861A Insect bite (nonvenomous) of abdominal wall, initial encounter: Secondary | ICD-10-CM | POA: Diagnosis not present

## 2022-10-13 DIAGNOSIS — L814 Other melanin hyperpigmentation: Secondary | ICD-10-CM | POA: Diagnosis not present

## 2022-10-13 DIAGNOSIS — D225 Melanocytic nevi of trunk: Secondary | ICD-10-CM | POA: Diagnosis not present

## 2022-10-13 DIAGNOSIS — D170 Benign lipomatous neoplasm of skin and subcutaneous tissue of head, face and neck: Secondary | ICD-10-CM | POA: Diagnosis not present

## 2022-10-20 ENCOUNTER — Encounter: Payer: Self-pay | Admitting: Internal Medicine

## 2022-10-20 ENCOUNTER — Ambulatory Visit: Payer: Medicare HMO | Admitting: Internal Medicine

## 2022-10-20 VITALS — BP 110/68 | HR 59 | Temp 98.0°F | Ht 66.0 in | Wt 170.0 lb

## 2022-10-20 DIAGNOSIS — E119 Type 2 diabetes mellitus without complications: Secondary | ICD-10-CM

## 2022-10-20 DIAGNOSIS — S92001D Unspecified fracture of right calcaneus, subsequent encounter for fracture with routine healing: Secondary | ICD-10-CM | POA: Diagnosis not present

## 2022-10-20 DIAGNOSIS — R609 Edema, unspecified: Secondary | ICD-10-CM | POA: Diagnosis not present

## 2022-10-20 DIAGNOSIS — R454 Irritability and anger: Secondary | ICD-10-CM | POA: Diagnosis not present

## 2022-10-20 NOTE — Assessment & Plan Note (Signed)
S/p recent R heel fx - 03/2022 - [facility]

## 2022-10-20 NOTE — Assessment & Plan Note (Signed)
Cont on Actos and Amaryl.  

## 2022-10-20 NOTE — Assessment & Plan Note (Signed)
Continue with  Lexapro at hs Alprazolam prn - not taking  Potential benefits of a long term opioids use as well as potential risks (i.e. addiction risk, apnea etc) and complications (i.e. Somnolence, constipation and others) were explained to the patient and were aknowledged. 

## 2022-10-20 NOTE — Progress Notes (Signed)
Subjective:  Patient ID: Jacob Chapman, male    DOB: 11-01-1945  Age: 77 y.o. MRN: [id number]  CC: Follow-up (3 mnth f/u)   HPI Jacob Chapman presents for DM, heel fx, dyslipidemia  Outpatient Medications Prior to Visit  Medication Sig Dispense Refill  . Accu-Chek Softclix Lancets lancets TEST BLOOD SUGAR EVERY DAY (ANNUAL APPOINTMENT DUE IN NOVEMBER MUST SEE PROVIDER FOR FUTURE REFILLS) 100 each 3  . acyclovir (ZOVIRAX) 400 MG tablet TAKE 1 TABLET  3 TIMES DAILY FOR COLD SORES 21 tablet 3  . Alcohol Swabs (DROPSAFE ALCOHOL PREP) 70 % PADS USE AS DIRECTED TO WIPE FINGER TO CHECK BLOOD SUGARS EVERY DAY (APPT DUE IN [place]. SEE MD FOR FUTURE REFILLS) 100 each 3  . ALPRAZolam (XANAX) 0.25 MG tablet Take 1 tablet (0.25 mg total) by mouth 2 (two) times daily as needed for anxiety. 60 tablet 2  . amLODipine (NORVASC) 5 MG tablet TAKE 1 AND 1/2 TABLETS EVERY DAY (ANNUAL APPOINTMENT DUE IN [place]. MUST SEE PROVIDER FOR FUTURE REFILLS) 135 tablet 3  . aspirin 81 MG EC tablet Take 81 mg by mouth daily.    . Blood Glucose Calibration (ACCU-CHEK AVIVA) SOLN USE AS DIRECTED WITH GLUCOSE METER 1 each 3  . Blood Glucose Monitoring Suppl (ACCU-CHEK AVIVA PLUS) w/Device KIT TEST BLOOD SUGAR EVERY DAY 1 kit 0  . Blood Glucose Monitoring Suppl (ACCU-CHEK GUIDE) w/Device KIT USE AS DIRECTED 1 kit 3  . cholecalciferol (VITAMIN D) 1000 UNITS tablet daily.    . Cyanocobalamin (VITAMIN B12 PO) daily.    Marland Kitchen escitalopram (LEXAPRO) 5 MG tablet TAKE 1 TABLET BY MOUTH EVERYDAY AT BEDTIME 90 tablet 3  . furosemide (LASIX) 20 MG tablet Take 1-2 tablets (20-40 mg total) by mouth daily as needed for edema. 180 tablet 0  . glimepiride (AMARYL) 2 MG tablet TAKE 2 TABLETS DAILY WITH BREAKFAST (MUST SEE PROVIDER AT ANNUAL NOVEMBER APPT FOR FUTURE REFILLS) 180 tablet 3  . glucose blood (ACCU-CHEK AVIVA PLUS) test strip TEST BLOOD SUGAR ONE TIME DAILY 100 strip 3  . losartan (COZAAR) 100 MG tablet TAKE 1 TABLET EVERY DAY  (ANNUAL APPOINTMENT DUE IN [place]. MUST SEE PROVIDER FOR FUTURE REFILLS) 90 tablet 3  . lovastatin (MEVACOR) 20 MG tablet TAKE 1 TABLET AT BEDTIME (ANNUAL APPOINTMENT DUE IN [place]OV[place] MUST SEE PROVIDER FOR FUTURE REFILLS) 90 tablet 3  . pioglitazone (ACTOS) 45 MG tablet TAKE 1 TABLET EVERY DAY (ANNUAL APPOINTMENT DUE IN NOVEMBER, MUST SEE PROVIDER FOR FUTURE REFILLS) 90 tablet 3  . tiZANidine (ZANAFLEX) 2 MG tablet Take 1 tablet (2 mg total) by mouth every 8 (eight) hours as needed for muscle spasms. 15 tablet 0  . traMADol (ULTRAM) 50 MG tablet Take 50 mg by mouth every 6 (six) hours as needed.    . vitamin B-12 (CYANOCOBALAMIN) 500 MCG tablet Take 500 mcg by mouth daily.     No facility-administered medications prior to visit.    ROS: Review of Systems  Constitutional:  Negative for appetite change, fatigue and unexpected weight change.  HENT:  Negative for congestion, nosebleeds, sneezing, sore throat and trouble swallowing.   Eyes:  Negative for itching and visual disturbance.  Respiratory:  Negative for cough.   Cardiovascular:  Negative for chest pain, palpitations and leg swelling.  Gastrointestinal:  Negative for abdominal distention, blood in stool, diarrhea and nausea.  Genitourinary:  Negative for frequency and hematuria.  Musculoskeletal:  Negative for back pain, gait problem, joint swelling and neck pain.  Skin:  Negative for rash.  Neurological:  Negative for dizziness, tremors, speech difficulty and weakness.  Psychiatric/Behavioral:  Negative for agitation, dysphoric mood, sleep disturbance and suicidal ideas. The patient is not nervous/anxious.     Objective:  BP 110/68 (BP Location: Left Arm, Patient Position: Sitting, Cuff Size: Large)   Pulse (!) 59   Temp 98 F (36.7 C) (Oral)   Ht 5\' 6"  (1.676 m)   Wt 170 lb (77.1 kg)   SpO2 97%   BMI 27.44 kg/m   BP Readings from Last 3 Encounters:  10/20/22 110/68  07/14/22 122/78  03/04/22 110/72    Wt Readings from  Last 3 Encounters:  10/20/22 170 lb (77.1 kg)  07/14/22 168 lb (76.2 kg)  03/04/22 167 lb (75.8 kg)    Physical Exam Constitutional:      General: He is not in acute distress.    Appearance: He is well-developed.     Comments: NAD  Eyes:     Conjunctiva/sclera: Conjunctivae normal.     Pupils: Pupils are equal, round, and reactive to light.  Neck:     Thyroid: No thyromegaly.     Vascular: No JVD.  Cardiovascular:     Rate and Rhythm: Normal rate and regular rhythm.     Heart sounds: Normal heart sounds. No murmur heard.    No friction rub. No gallop.  Pulmonary:     Effort: Pulmonary effort is normal. No respiratory distress.     Breath sounds: Normal breath sounds. No wheezing or rales.  Chest:     Chest wall: No tenderness.  Abdominal:     General: Bowel sounds are normal. There is no distension.     Palpations: Abdomen is soft. There is no mass.     Tenderness: There is no abdominal tenderness. There is no guarding or rebound.  Musculoskeletal:        General: No tenderness. Normal range of motion.     Cervical back: Normal range of motion.  Lymphadenopathy:     Cervical: No cervical adenopathy.  Skin:    General: Skin is warm and dry.     Findings: No rash.  Neurological:     Mental Status: He is alert and oriented to person, place, and time.     Cranial Nerves: No cranial nerve deficit.     Motor: No abnormal muscle tone.     Coordination: Coordination normal.     Gait: Gait normal.     Deep Tendon Reflexes: Reflexes are normal and symmetric.  Psychiatric:        Behavior: Behavior normal.        Thought Content: Thought content normal.        Judgment: Judgment normal.    Lab Results  Component Value Date   WBC 4.0 02/25/2021   HGB 13.8 02/25/2021   HCT 41.5 02/25/2021   PLT 184.0 02/25/2021   GLUCOSE 120 (H) 10/12/2022   CHOL 117 10/12/2022   TRIG 78.0 10/12/2022   HDL 37.20 (L) 10/12/2022   LDLCALC 65 10/12/2022   ALT 13 10/12/2022   AST 17  10/12/2022   NA 139 10/12/2022   K 3.9 10/12/2022   CL 106 10/12/2022   CREATININE 0.97 10/12/2022   BUN 17 10/12/2022   CO2 26 10/12/2022   TSH 2.04 02/25/2021   PSA 0.3 01/30/2016   INR CANCELED 01/18/2014   HGBA1C 6.8 (H) 10/12/2022   MICROALBUR 1.4 02/25/2021    CT FOOT RIGHT WO CONTRAST  Result  Date: 04/02/2022 CLINICAL DATA:  Status post fall from a ladder 2 days ago. EXAM: CT OF THE RIGHT FOOT WITHOUT CONTRAST TECHNIQUE: Multidetector CT imaging of the right foot was performed according to the standard protocol. Multiplanar CT image reconstructions were also generated. RADIATION DOSE REDUCTION: This exam was performed according to the departmental dose-optimization program which includes automated exposure control, adjustment of the mA and/or kV according to patient size and/or use of iterative reconstruction technique. COMPARISON:  None Available. FINDINGS: Bones/Joint/Cartilage Comminuted fracture of the mid calcaneus without significant depression or malalignment. Nondisplaced fracture of the talar neck and anterior process of the talus. Oblique fracture cleft extending from the mid calcaneus to the posterior calcaneus at the Achilles insertion. Small fracture cleft extends to the periphery of the posterior subtalar joint without depression or distraction. No other acute fracture or dislocation. Small plantar calcaneal spur.  Normal alignment. No joint effusion. Ligaments Ligaments are suboptimally evaluated by CT. Muscles and Tendons Muscles are normal. No muscle atrophy. No intramuscular fluid collection or hematoma. Flexor, extensor, peroneal and Achilles tendons are intact. Soft tissue No fluid collection or hematoma. No soft tissue mass. Soft tissue swelling around the ankle. IMPRESSION: 1. Comminuted fracture of the mid calcaneus without significant depression or malalignment. Nondisplaced fracture of the talar neck and anterior process of the talus. Oblique fracture cleft extending  from the mid calcaneus to the posterior calcaneus at the Achilles insertion. Small fracture cleft extends to the periphery of the posterior subtalar joint without depression or distraction. Electronically Signed   By: Elige Ko M.D.   On: 04/02/2022 09:50    Assessment & Plan:   Problem List Items Addressed This Visit     DM2 (diabetes mellitus, type 2) ([facility]) - Primary    Cont on Actos and Amaryl.       Irritability and anger    Continue with  Lexapro at hs Alprazolam prn - not taking  Potential benefits of a long term opioids use as well as potential risks (i.e. addiction risk, apnea etc) and complications (i.e. Somnolence, constipation and others) were explained to the patient and were aknowledged.      Heel bone fracture    S/p recent R heel fx - 03/2022 - [facility]      Edema    RLE - No edema now          No orders of the defined types were placed in this encounter.     Follow-up: No follow-ups on file.  Sonda Primes, MD

## 2022-10-20 NOTE — Assessment & Plan Note (Signed)
RLE - No edema now

## 2022-10-31 ENCOUNTER — Other Ambulatory Visit: Payer: Self-pay | Admitting: Internal Medicine

## 2022-11-23 DIAGNOSIS — C61 Malignant neoplasm of prostate: Secondary | ICD-10-CM | POA: Diagnosis not present

## 2022-11-30 DIAGNOSIS — C61 Malignant neoplasm of prostate: Secondary | ICD-10-CM | POA: Diagnosis not present

## 2022-12-05 ENCOUNTER — Other Ambulatory Visit: Payer: Self-pay | Admitting: Internal Medicine

## 2022-12-09 ENCOUNTER — Other Ambulatory Visit (HOSPITAL_COMMUNITY): Payer: Self-pay | Admitting: Urology

## 2022-12-09 DIAGNOSIS — C61 Malignant neoplasm of prostate: Secondary | ICD-10-CM

## 2022-12-16 ENCOUNTER — Other Ambulatory Visit: Payer: Self-pay | Admitting: Internal Medicine

## 2022-12-23 ENCOUNTER — Encounter (HOSPITAL_COMMUNITY)
Admission: RE | Admit: 2022-12-23 | Discharge: 2022-12-23 | Disposition: A | Discharge status: Home / Self Care | Payer: Medicare HMO | Source: Ambulatory Visit | Attending: Urology | Admitting: Urology

## 2022-12-23 DIAGNOSIS — C61 Malignant neoplasm of prostate: Secondary | ICD-10-CM | POA: Diagnosis not present

## 2022-12-23 MED ORDER — FLOTUFOLASTAT F 18 GALLIUM 296-5846 MBQ/ML IV SOLN
8.5400 | Freq: Once | INTRAVENOUS | Status: AC
  Administered 2022-12-23: 8.54 via INTRAVENOUS

## 2023-01-07 ENCOUNTER — Other Ambulatory Visit (HOSPITAL_COMMUNITY): Payer: Self-pay | Admitting: Urology

## 2023-01-07 ENCOUNTER — Other Ambulatory Visit: Payer: Self-pay | Admitting: Urology

## 2023-01-07 DIAGNOSIS — C61 Malignant neoplasm of prostate: Secondary | ICD-10-CM

## 2023-01-11 ENCOUNTER — Other Ambulatory Visit (INDEPENDENT_AMBULATORY_CARE_PROVIDER_SITE_OTHER): Payer: Medicare HMO

## 2023-01-11 DIAGNOSIS — E785 Hyperlipidemia, unspecified: Secondary | ICD-10-CM

## 2023-01-11 DIAGNOSIS — I10 Essential (primary) hypertension: Secondary | ICD-10-CM

## 2023-01-11 DIAGNOSIS — E119 Type 2 diabetes mellitus without complications: Secondary | ICD-10-CM

## 2023-01-11 DIAGNOSIS — R609 Edema, unspecified: Secondary | ICD-10-CM | POA: Diagnosis not present

## 2023-01-11 DIAGNOSIS — C61 Malignant neoplasm of prostate: Secondary | ICD-10-CM

## 2023-01-11 DIAGNOSIS — R209 Unspecified disturbances of skin sensation: Secondary | ICD-10-CM | POA: Diagnosis not present

## 2023-01-11 LAB — HEPATIC FUNCTION PANEL
ALT: 15 U/L (ref 0–53)
AST: 20 U/L (ref 0–37)
Albumin: 3.9 g/dL (ref 3.5–5.2)
Alkaline Phosphatase: 63 U/L (ref 39–117)
Bilirubin, Direct: 0.1 mg/dL (ref 0.0–0.3)
Total Bilirubin: 0.5 mg/dL (ref 0.2–1.2)
Total Protein: 6.6 g/dL (ref 6.0–8.3)

## 2023-01-11 LAB — COMPREHENSIVE METABOLIC PANEL
ALT: 15 U/L (ref 0–53)
AST: 20 U/L (ref 0–37)
Albumin: 3.9 g/dL (ref 3.5–5.2)
Alkaline Phosphatase: 63 U/L (ref 39–117)
BUN: 17 mg/dL (ref 6–23)
CO2: 26 mEq/L (ref 19–32)
Calcium: 9 mg/dL (ref 8.4–10.5)
Chloride: 107 mEq/L (ref 96–112)
Creatinine, Ser: 1.08 mg/dL (ref 0.40–1.50)
GFR: 67.39 mL/min (ref >60.00)
Glucose, Bld: 95 mg/dL (ref 70–99)
Potassium: 4.1 mEq/L (ref 3.5–5.1)
Sodium: 141 mEq/L (ref 135–145)
Total Bilirubin: 0.5 mg/dL (ref 0.2–1.2)
Total Protein: 6.6 g/dL (ref 6.0–8.3)

## 2023-01-11 LAB — LIPID PANEL
Cholesterol: 129 mg/dL (ref 0–200)
HDL: 44.7 mg/dL (ref >39.00)
LDL Cholesterol: 73 mg/dL (ref 0–99)
NonHDL: 84.01
Total CHOL/HDL Ratio: 3
Triglycerides: 55 mg/dL (ref 0.0–149.0)
VLDL: 11 mg/dL (ref 0.0–40.0)

## 2023-01-11 LAB — HEMOGLOBIN A1C: Hgb A1c MFr Bld: 6.8 % — ABNORMAL HIGH (ref 4.6–6.5)

## 2023-01-19 ENCOUNTER — Encounter: Payer: Self-pay | Admitting: Internal Medicine

## 2023-01-19 ENCOUNTER — Ambulatory Visit (INDEPENDENT_AMBULATORY_CARE_PROVIDER_SITE_OTHER): Payer: Medicare HMO | Admitting: Internal Medicine

## 2023-01-19 VITALS — BP 120/70 | HR 59 | Temp 98.2°F | Ht 66.0 in | Wt 169.0 lb

## 2023-01-19 DIAGNOSIS — I10 Essential (primary) hypertension: Secondary | ICD-10-CM

## 2023-01-19 DIAGNOSIS — Z7984 Long term (current) use of oral hypoglycemic drugs: Secondary | ICD-10-CM

## 2023-01-19 DIAGNOSIS — E785 Hyperlipidemia, unspecified: Secondary | ICD-10-CM

## 2023-01-19 DIAGNOSIS — E119 Type 2 diabetes mellitus without complications: Secondary | ICD-10-CM | POA: Diagnosis not present

## 2023-01-19 DIAGNOSIS — C61 Malignant neoplasm of prostate: Secondary | ICD-10-CM | POA: Diagnosis not present

## 2023-01-19 NOTE — Patient Instructions (Signed)
  Many things can affect PSA levels including ejaculation, riding a bike, certain medical procedures involving the prostate and certain medicines or herbs

## 2023-01-19 NOTE — Assessment & Plan Note (Signed)
Chronic Cont on Norvasc 5 mg and Losartan 100 mg/d Nl BP at home

## 2023-01-19 NOTE — Assessment & Plan Note (Signed)
Chronic  Cont on Lovastatin 20 mg a day

## 2023-01-19 NOTE — Assessment & Plan Note (Signed)
Cont on Actos and Amaryl.

## 2023-01-19 NOTE — Assessment & Plan Note (Signed)
F/u w/ Dr Benancio Deeds due in Dec 2022 Dr Rolan Bucco PET CT IMPRESSION: 1. No convincing evidence of local prostate cancer recurrence or distant metastasis. 2. No radiotracer avid lymphadenopathy. 3. No skeletal metastasis. Electronically Signed   By: Genevive Bi M.D.   On: 12/31/2022 10:48  Prostate bx is pending in Oct 2024

## 2023-01-19 NOTE — Progress Notes (Signed)
Subjective:  Patient ID: Jacob Chapman, male    DOB: February 01, 1946  Age: 77 y.o. MRN: [id number]  CC: Follow-up (3 MNTH F/U)   HPI Jacob Chapman presents for DM, HTN, prostate cancer  Outpatient Medications Prior to Visit  Medication Sig Dispense Refill   ACCU-CHEK GUIDE test strip TEST BLOOD SUGAR ONE TIME DAILY 100 strip 3   Accu-Chek Softclix Lancets lancets TEST BLOOD SUGAR EVERY DAY (ANNUAL APPOINTMENT DUE IN NOVEMBER MUST SEE PROVIDER FOR FUTURE REFILLS) 100 each 3   acyclovir (ZOVIRAX) 400 MG tablet TAKE 1 TABLET  3 TIMES DAILY FOR COLD SORES 21 tablet 3   Alcohol Swabs (DROPSAFE ALCOHOL PREP) 70 % PADS USE AS DIRECTED TO WIPE FINGER TO CHECK BLOOD SUGARS EVERY DAY (APPT DUE IN [place]. SEE MD FOR FUTURE REFILLS) 100 each 3   ALPRAZolam (XANAX) 0.25 MG tablet Take 1 tablet (0.25 mg total) by mouth 2 (two) times daily as needed for anxiety. 60 tablet 2   amLODipine (NORVASC) 5 MG tablet TAKE 1 AND 1/2 TABLETS EVERY DAY (ANNUAL APPOINTMENT DUE IN [place]. MUST SEE PROVIDER FOR FUTURE REFILLS) 135 tablet 3   aspirin 81 MG EC tablet Take 81 mg by mouth daily.     Blood Glucose Calibration (ACCU-CHEK AVIVA) SOLN USE AS DIRECTED WITH GLUCOSE METER 1 each 3   Blood Glucose Monitoring Suppl (ACCU-CHEK AVIVA PLUS) w/Device KIT TEST BLOOD SUGAR EVERY DAY 1 kit 0   Blood Glucose Monitoring Suppl (ACCU-CHEK GUIDE) w/Device KIT USE AS DIRECTED 1 kit 3   cholecalciferol (VITAMIN D) 1000 UNITS tablet daily.     Cyanocobalamin (VITAMIN B12 PO) daily.     escitalopram (LEXAPRO) 5 MG tablet TAKE 1 TABLET BY MOUTH EVERYDAY AT BEDTIME 90 tablet 3   furosemide (LASIX) 20 MG tablet TAKE 1 TO 2 TABLETS EVERY DAY AS NEEDED FOR EDEMA 180 tablet 3   glimepiride (AMARYL) 2 MG tablet TAKE 2 TABLETS DAILY WITH BREAKFAST (MUST SEE PROVIDER AT ANNUAL NOVEMBER APPT FOR FUTURE REFILLS) 180 tablet 3   losartan (COZAAR) 100 MG tablet TAKE 1 TABLET EVERY DAY (ANNUAL APPOINTMENT DUE IN [place]. MUST SEE PROVIDER FOR  FUTURE REFILLS) 90 tablet 3   lovastatin (MEVACOR) 20 MG tablet TAKE 1 TABLET AT BEDTIME (ANNUAL APPOINTMENT DUE IN NOVEMBER MUST SEE PROVIDER FOR FUTURE REFILLS) 90 tablet 3   pioglitazone (ACTOS) 45 MG tablet TAKE 1 TABLET EVERY DAY (ANNUAL APPOINTMENT DUE IN NOVEMBER, MUST SEE PROVIDER FOR FUTURE REFILLS) 90 tablet 3   tiZANidine (ZANAFLEX) 2 MG tablet Take 1 tablet (2 mg total) by mouth every 8 (eight) hours as needed for muscle spasms. 15 tablet 0   traMADol (ULTRAM) 50 MG tablet Take 50 mg by mouth every 6 (six) hours as needed.     vitamin B-12 (CYANOCOBALAMIN) 500 MCG tablet Take 500 mcg by mouth daily.     No facility-administered medications prior to visit.    ROS: Review of Systems  Constitutional:  Negative for appetite change, fatigue and unexpected weight change.  HENT:  Negative for congestion, nosebleeds, sneezing, sore throat and trouble swallowing.   Eyes:  Negative for itching and visual disturbance.  Respiratory:  Negative for cough.   Cardiovascular:  Negative for chest pain, palpitations and leg swelling.  Gastrointestinal:  Negative for abdominal distention, blood in stool, diarrhea and nausea.  Genitourinary:  Negative for frequency and hematuria.  Musculoskeletal:  Negative for back pain, gait problem, joint swelling and neck pain.  Skin:  Negative for rash.  Neurological:  Negative for dizziness, tremors, speech difficulty and weakness.  Psychiatric/Behavioral:  Negative for agitation, dysphoric mood and sleep disturbance. The patient is not nervous/anxious.     Objective:  BP 120/70 (BP Location: Left Arm, Patient Position: Sitting, Cuff Size: Normal)   Pulse (!) 59   Temp 98.2 F (36.8 C) (Oral)   Ht 5\' 6"  (1.676 m)   Wt 169 lb (76.7 kg)   SpO2 95%   BMI 27.28 kg/m   BP Readings from Last 3 Encounters:  01/19/23 120/70  10/20/22 110/68  07/14/22 122/78    Wt Readings from Last 3 Encounters:  01/19/23 169 lb (76.7 kg)  10/20/22 170 lb (77.1 kg)   07/14/22 168 lb (76.2 kg)    Physical Exam Constitutional:      General: He is not in acute distress.    Appearance: Normal appearance. He is well-developed.     Comments: NAD  Eyes:     Conjunctiva/sclera: Conjunctivae normal.     Pupils: Pupils are equal, round, and reactive to light.  Neck:     Thyroid: No thyromegaly.     Vascular: No JVD.  Cardiovascular:     Rate and Rhythm: Normal rate and regular rhythm.     Heart sounds: Normal heart sounds. No murmur heard.    No friction rub. No gallop.  Pulmonary:     Effort: Pulmonary effort is normal. No respiratory distress.     Breath sounds: Normal breath sounds. No wheezing or rales.  Chest:     Chest wall: No tenderness.  Abdominal:     General: Bowel sounds are normal. There is no distension.     Palpations: Abdomen is soft. There is no mass.     Tenderness: There is no abdominal tenderness. There is no guarding or rebound.  Musculoskeletal:        General: No tenderness. Normal range of motion.     Cervical back: Normal range of motion.  Lymphadenopathy:     Cervical: No cervical adenopathy.  Skin:    General: Skin is warm and dry.     Findings: No rash.  Neurological:     Mental Status: He is alert and oriented to person, place, and time.     Cranial Nerves: No cranial nerve deficit.     Motor: No abnormal muscle tone.     Coordination: Coordination normal.     Gait: Gait normal.     Deep Tendon Reflexes: Reflexes are normal and symmetric.  Psychiatric:        Behavior: Behavior normal.        Thought Content: Thought content normal.        Judgment: Judgment normal.     Lab Results  Component Value Date   WBC 4.0 02/25/2021   HGB 13.8 02/25/2021   HCT 41.5 02/25/2021   PLT 184.0 02/25/2021   GLUCOSE 95 01/11/2023   CHOL 129 01/11/2023   TRIG 55.0 01/11/2023   HDL 44.70 01/11/2023   LDLCALC 73 01/11/2023   ALT 15 01/11/2023   ALT 15 01/11/2023   AST 20 01/11/2023   AST 20 01/11/2023   NA 141  01/11/2023   K 4.1 01/11/2023   CL 107 01/11/2023   CREATININE 1.08 01/11/2023   BUN 17 01/11/2023   CO2 26 01/11/2023   TSH 2.04 02/25/2021   PSA 0.3 01/30/2016   INR CANCELED 01/18/2014   HGBA1C 6.8 (H) 01/11/2023   MICROALBUR 1.4 02/25/2021    NM PET (PSMA) SKULL  TO MID THIGH  Result Date: 12/31/2022 CLINICAL DATA:  Prostate carcinoma with biochemical recurrence. EXAM: NUCLEAR MEDICINE PET SKULL BASE TO THIGH TECHNIQUE: 8.5 mCi F18 Piflufolastat (Pylarify) was injected intravenously. Full-ring PET imaging was performed from the skull base to thigh after the radiotracer. CT data was obtained and used for attenuation correction and anatomic localization. COMPARISON:  None Available. FINDINGS: NECK Is Incidental CT finding: None. CHEST No radiotracer accumulation within mediastinal or hilar lymph nodes. No suspicious pulmonary nodules on the CT scan. Incidental CT finding: None. ABDOMEN/PELVIS Prostate: Brachytherapy seeds within the prostate gland. No focal activity within the prostate gland itself. Mild radiotracer activity within the LEFT and RIGHT Seminal vesicles. Activity is greater on the RIGHT with SUV max equal 3.0 on image 190. This activity is relatively low and nonspecific. Lymph nodes: No abnormal radiotracer accumulation within pelvic or abdominal nodes. Liver: No evidence of liver metastasis. Incidental CT finding: None. SKELETON No focal activity to suggest skeletal metastasis. IMPRESSION: 1. No convincing evidence of local prostate cancer recurrence or distant metastasis. 2. No radiotracer avid lymphadenopathy. 3. No skeletal metastasis. Electronically Signed   By: Genevive Bi M.D.   On: 12/31/2022 10:48    Assessment & Plan:   Problem List Items Addressed This Visit     DM2 (diabetes mellitus, type 2) (HCC) - Primary    Cont on Actos and Amaryl.       Dyslipidemia    Chronic  Cont on Lovastatin 20 mg a day      Relevant Orders   TSH   Lipid panel   Essential  hypertension    Chronic Cont on Norvasc 5 mg and Losartan 100 mg/d Nl BP at home      Relevant Orders   TSH   CBC with Differential/Platelet   Comprehensive metabolic panel   Hemoglobin A1c   Lipid panel   Prostate cancer [facility])    F/u w/ Dr Benancio Deeds due in Dec 2022 Dr Liliane Shi  2024 PET CT IMPRESSION: 1. No convincing evidence of local prostate cancer recurrence or distant metastasis. 2. No radiotracer avid lymphadenopathy. 3. No skeletal metastasis. Electronically Signed   By: Genevive Bi M.D.   On: 12/31/2022 10:48  Prostate bx is pending in Oct 2024         No orders of the defined types were placed in this encounter.     Follow-up: Return in about 3 months (around 04/21/2023) for a follow-up visit.  Sonda Primes, MD

## 2023-01-29 ENCOUNTER — Encounter (HOSPITAL_BASED_OUTPATIENT_CLINIC_OR_DEPARTMENT_OTHER): Payer: Self-pay | Admitting: Urology

## 2023-02-01 ENCOUNTER — Other Ambulatory Visit: Payer: Self-pay

## 2023-02-01 ENCOUNTER — Encounter (HOSPITAL_BASED_OUTPATIENT_CLINIC_OR_DEPARTMENT_OTHER): Payer: Self-pay | Admitting: Urology

## 2023-02-01 NOTE — Progress Notes (Signed)
Spoke w/ via phone for pre-op interview---pt Lab needs dos---- I stat, ekg        Lab results------ COVID test -----patient states asymptomatic no test needed Arrive at -------800 02-08-2021 NPO after MN NO Solid Food.  Clear liquids from MN until---700 Med rec completed Medications to take morning of surgery -----amlodipine, alprazolam prn Diabetic medication -----none day of surgery Patient instructed no nail polish to be worn day of surgery Patient instructed to bring photo id and insurance card day of surgery Patient aware to have Driver (ride ) / caregiver    for 24 hours after surgery  sister in law colleen Grisanti-  Patient Special Instructions -----fleets enema am of surgery per dr winter instructions Pre-Op special Instructions -----pt stopped 81 mg asa 01-30-2023 (note to stop 81 mg asa per protocol dr Janae Sauce 01-12-2023 on chart) Patient verbalized understanding of instructions that were given at this phone interview. Patient denies chest pain, sob, fever, cough at the interview.

## 2023-02-03 ENCOUNTER — Other Ambulatory Visit: Payer: Self-pay | Admitting: Internal Medicine

## 2023-02-09 ENCOUNTER — Ambulatory Visit (HOSPITAL_BASED_OUTPATIENT_CLINIC_OR_DEPARTMENT_OTHER)
Admission: RE | Admit: 2023-02-09 | Discharge: 2023-02-09 | Disposition: A | Discharge status: Home / Self Care | Payer: Medicare HMO | Attending: Urology | Admitting: Urology

## 2023-02-09 ENCOUNTER — Encounter (HOSPITAL_BASED_OUTPATIENT_CLINIC_OR_DEPARTMENT_OTHER)
Admission: RE | Disposition: A | Discharge status: Home / Self Care | Payer: Self-pay | Source: Home / Self Care | Attending: Urology

## 2023-02-09 ENCOUNTER — Ambulatory Visit (HOSPITAL_COMMUNITY)
Admission: RE | Admit: 2023-02-09 | Discharge: 2023-02-09 | Disposition: A | Discharge status: Home / Self Care | Payer: Medicare HMO | Source: Ambulatory Visit | Attending: *Deleted | Admitting: *Deleted

## 2023-02-09 ENCOUNTER — Ambulatory Visit (HOSPITAL_BASED_OUTPATIENT_CLINIC_OR_DEPARTMENT_OTHER): Payer: Medicare HMO | Admitting: Anesthesiology

## 2023-02-09 ENCOUNTER — Other Ambulatory Visit: Payer: Self-pay

## 2023-02-09 ENCOUNTER — Encounter (HOSPITAL_BASED_OUTPATIENT_CLINIC_OR_DEPARTMENT_OTHER): Payer: Self-pay | Admitting: Urology

## 2023-02-09 DIAGNOSIS — I10 Essential (primary) hypertension: Secondary | ICD-10-CM | POA: Insufficient documentation

## 2023-02-09 DIAGNOSIS — C50912 Malignant neoplasm of unspecified site of left female breast: Secondary | ICD-10-CM | POA: Diagnosis not present

## 2023-02-09 DIAGNOSIS — C61 Malignant neoplasm of prostate: Secondary | ICD-10-CM | POA: Insufficient documentation

## 2023-02-09 DIAGNOSIS — E119 Type 2 diabetes mellitus without complications: Secondary | ICD-10-CM | POA: Insufficient documentation

## 2023-02-09 DIAGNOSIS — R3915 Urgency of urination: Secondary | ICD-10-CM | POA: Insufficient documentation

## 2023-02-09 DIAGNOSIS — R9721 Rising PSA following treatment for malignant neoplasm of prostate: Secondary | ICD-10-CM | POA: Diagnosis not present

## 2023-02-09 DIAGNOSIS — Z923 Personal history of irradiation: Secondary | ICD-10-CM | POA: Insufficient documentation

## 2023-02-09 DIAGNOSIS — R350 Frequency of micturition: Secondary | ICD-10-CM | POA: Insufficient documentation

## 2023-02-09 DIAGNOSIS — Z7984 Long term (current) use of oral hypoglycemic drugs: Secondary | ICD-10-CM | POA: Diagnosis not present

## 2023-02-09 DIAGNOSIS — M1990 Unspecified osteoarthritis, unspecified site: Secondary | ICD-10-CM | POA: Diagnosis not present

## 2023-02-09 DIAGNOSIS — Z01818 Encounter for other preprocedural examination: Secondary | ICD-10-CM

## 2023-02-09 HISTORY — PX: PROSTATE BIOPSY: SHX241

## 2023-02-09 LAB — POCT I-STAT, CHEM 8
BUN: 25 mg/dL — ABNORMAL HIGH (ref 8–23)
Calcium, Ion: 1.2 mmol/L (ref 1.15–1.40)
Chloride: 106 mmol/L (ref 98–111)
Creatinine, Ser: 1 mg/dL (ref 0.61–1.24)
Glucose, Bld: 85 mg/dL (ref 70–99)
HCT: 42 % (ref 39.0–52.0)
Hemoglobin: 14.3 g/dL (ref 13.0–17.0)
Potassium: 4.5 mmol/L (ref 3.5–5.1)
Sodium: 142 mmol/L (ref 135–145)
TCO2: 26 mmol/L (ref 22–32)

## 2023-02-09 LAB — GLUCOSE, CAPILLARY: Glucose-Capillary: 112 mg/dL — ABNORMAL HIGH (ref 70–99)

## 2023-02-09 SURGERY — BIOPSY, PROSTATE, RECTAL APPROACH, WITH US GUIDANCE
Anesthesia: Monitor Anesthesia Care

## 2023-02-09 MED ORDER — EPHEDRINE SULFATE (PRESSORS) 50 MG/ML IJ SOLN
INTRAMUSCULAR | Status: DC | PRN
  Administered 2023-02-09 (×4): 5 mg via INTRAVENOUS

## 2023-02-09 MED ORDER — LIDOCAINE 2% (20 MG/ML) 5 ML SYRINGE
INTRAMUSCULAR | Status: DC | PRN
  Administered 2023-02-09: 40 mg via INTRAVENOUS

## 2023-02-09 MED ORDER — LACTATED RINGERS IV SOLN: INTRAVENOUS | Status: DC

## 2023-02-09 MED ORDER — FENTANYL CITRATE (PF) 250 MCG/5ML IJ SOLN
INTRAMUSCULAR | Status: DC | PRN
  Administered 2023-02-09 (×2): 25 ug via INTRAVENOUS
  Administered 2023-02-09: 50 ug via INTRAVENOUS

## 2023-02-09 MED ORDER — PROPOFOL 10 MG/ML IV BOLUS
INTRAVENOUS | Status: DC | PRN
  Administered 2023-02-09: 20 mg via INTRAVENOUS

## 2023-02-09 MED ORDER — ONDANSETRON HCL 4 MG/2ML IJ SOLN
INTRAMUSCULAR | Status: DC | PRN
  Administered 2023-02-09: 4 mg via INTRAVENOUS

## 2023-02-09 MED ORDER — LACTATED RINGERS IV SOLN: INTRAVENOUS | Status: DC | PRN

## 2023-02-09 MED ORDER — PROPOFOL 500 MG/50ML IV EMUL
INTRAVENOUS | Status: AC
  Filled 2023-02-09: qty 50

## 2023-02-09 MED ORDER — LIDOCAINE HCL 2 % IJ SOLN
INTRAMUSCULAR | Status: DC | PRN
  Administered 2023-02-09: 10 mL

## 2023-02-09 MED ORDER — PHENYLEPHRINE 80 MCG/ML (10ML) SYRINGE FOR IV PUSH (FOR BLOOD PRESSURE SUPPORT)
PREFILLED_SYRINGE | INTRAVENOUS | Status: DC | PRN
  Administered 2023-02-09 (×3): 80 ug via INTRAVENOUS

## 2023-02-09 MED ORDER — CEFAZOLIN SODIUM-DEXTROSE 2-4 GM/100ML-% IV SOLN
2.0000 g | INTRAVENOUS | Status: AC
  Administered 2023-02-09: 2 g via INTRAVENOUS

## 2023-02-09 MED ORDER — PROPOFOL 500 MG/50ML IV EMUL
INTRAVENOUS | Status: DC | PRN
  Administered 2023-02-09: 150 ug/kg/min via INTRAVENOUS

## 2023-02-09 MED ORDER — PROPOFOL 10 MG/ML IV BOLUS
INTRAVENOUS | Status: AC
  Filled 2023-02-09: qty 20

## 2023-02-09 MED ORDER — ACETAMINOPHEN 500 MG PO TABS: 1000.0000 mg | ORAL_TABLET | Freq: Once | ORAL | Status: DC

## 2023-02-09 MED ORDER — CEFAZOLIN SODIUM-DEXTROSE 2-4 GM/100ML-% IV SOLN
INTRAVENOUS | Status: AC
  Filled 2023-02-09: qty 100

## 2023-02-09 MED ORDER — FLEET ENEMA RE ENEM: 1.0000 | ENEMA | Freq: Once | RECTAL | Status: DC

## 2023-02-09 MED ORDER — FENTANYL CITRATE (PF) 100 MCG/2ML IJ SOLN
INTRAMUSCULAR | Status: AC
  Filled 2023-02-09: qty 2

## 2023-02-09 MED ORDER — PROPOFOL 1000 MG/100ML IV EMUL
INTRAVENOUS | Status: AC
  Filled 2023-02-09: qty 100

## 2023-02-09 SURGICAL SUPPLY — 12 items
CLOTH BEACON ORANGE TIMEOUT ST (SAFETY) ×1 IMPLANT
INST BIOPSY MAXCORE 18GX25 (NEEDLE) IMPLANT
INSTR BIOPSY MAXCORE 18GX20 (NEEDLE) IMPLANT
KIT TURNOVER CYSTO (KITS) ×1 IMPLANT
NDL SAFETY ECLIPSE 18X1.5 (NEEDLE) IMPLANT
NDL SPNL 22GX7 QUINCKE BK (NEEDLE) ×1 IMPLANT
NEEDLE SPNL 22GX7 QUINCKE BK (NEEDLE) ×1
NS IRRIG 500ML POUR BTL (IV SOLUTION) ×1 IMPLANT
PAD PREP 24X48 CUFFED NSTRL (MISCELLANEOUS) ×1 IMPLANT
SLEEVE SCD COMPRESS KNEE MED (STOCKING) ×1 IMPLANT
SYR CONTROL 10ML LL (SYRINGE) IMPLANT
UNDERPAD 30X36 HEAVY ABSORB (UNDERPADS AND DIAPERS) ×2 IMPLANT

## 2023-02-09 NOTE — Transfer of Care (Signed)
Immediate Anesthesia Transfer of Care Note  Patient: Jacob Chapman  Procedure(s) Performed: BIOPSY TRANSRECTAL ULTRASONIC PROSTATE (TUBP)  Patient Location: PACU  Anesthesia Type:MAC  Level of Consciousness: sedated  Airway & Oxygen Therapy: Patient Spontanous Breathing and Patient connected to face mask oxygen  Post-op Assessment: Report given to RN and Post -op Vital signs reviewed and stable  Post vital signs: Reviewed and stable  Last Vitals:  Vitals Value Taken Time  BP    Temp    Pulse 58 02/09/23 1025  Resp 18 02/09/23 1025  SpO2 100 % 02/09/23 1025  Vitals shown include unfiled device data.  Last Pain:  Vitals:   02/09/23 0814  TempSrc: Oral  PainSc: 0-No pain      Patients Stated Pain Goal: 5 (02/09/23 5409)  Complications: No notable events documented.

## 2023-02-09 NOTE — Discharge Instructions (Signed)

## 2023-02-09 NOTE — Anesthesia Preprocedure Evaluation (Addendum)
Anesthesia Evaluation  Patient identified by MRN, date of birth, ID band Patient awake    Reviewed: Allergy & Precautions, NPO status , Patient's Chart, lab work & pertinent test results  History of Anesthesia Complications Negative for: history of anesthetic complications  Airway Mallampati: II  TM Distance: >3 FB Neck ROM: Full    Dental  (+) Dental Advisory Given   Pulmonary neg pulmonary ROS   Pulmonary exam normal        Cardiovascular hypertension, Pt. on medications Normal cardiovascular exam     Neuro/Psych negative neurological ROS  negative psych ROS   GI/Hepatic negative GI ROS, Neg liver ROS,,,  Endo/Other  diabetes, Type 2, Oral Hypoglycemic Agents    Renal/GU negative Renal ROS    Prostate cancer     Musculoskeletal  (+) Arthritis ,    Abdominal   Peds  Hematology negative hematology ROS (+)   Anesthesia Other Findings   Reproductive/Obstetrics                              Anesthesia Physical Anesthesia Plan  ASA: 2  Anesthesia Plan: MAC   Post-op Pain Management: Tylenol PO (pre-op)*   Induction:   PONV Risk Score and Plan: 1 and Propofol infusion and Treatment may vary due to age or medical condition  Airway Management Planned: Natural Airway and Simple Face Mask  Additional Equipment: None  Intra-op Plan:   Post-operative Plan:   Informed Consent: I have reviewed the patients History and Physical, chart, labs and discussed the procedure including the risks, benefits and alternatives for the proposed anesthesia with the patient or authorized representative who has indicated his/her understanding and acceptance.       Plan Discussed with: CRNA and Anesthesiologist  Anesthesia Plan Comments:          Anesthesia Quick Evaluation

## 2023-02-09 NOTE — Op Note (Signed)
Operative Note  Preoperative diagnosis:  1.  Rising PSA following brachytherapy treatment for grade 1 prostate cancer  Postoperative diagnosis: 1.  Rising PSA following brachytherapy treatment for grade 1 prostate cancer  Procedure(s): 1.  Transrectal ultrasound guided prostate biopsy  Surgeon: Rhoderick Moody, MD  Assistants:  None  Anesthesia:  General  Complications:  None  EBL: Less than 5 mL  Specimens: 1.  Prostate biopsy specimens  Drains/Catheters: 1.  None  Intraoperative findings:   Prostate volume of 16 cm  Indication:  KELLYN RUDY is a 77 y.o. male with a history of grade 1 prostate cancer that was definitively treated with brachytherapy seed placement in 2013.  His PSA has progressively risen since then and was most recently 1.8.  His PSMA PET CT scan was negative for any signs of distant metastasis.  He is here today for a prostate biopsy to assess for local recurrence.  He has been consented for the above procedures, voices understanding and wishes to proceed.  Description of procedure:  The patient was brought to the operating room and prepped in usual fashion.  MAC anesthesia was then administered.  He was placed in the lateral decubitus position and a timeout was then performed.  A transrectal ultrasound probe was then inserted into the rectum and a prostate volume of approximately 16 cm was obtained.  1% lidocaine without epinephrine was then injected around the prostate for local anesthesia.  A total of 12 needle biopsies were then obtained from each sextant of the prostate with minimal bleeding.  The patient tolerated the procedure well and was transferred to the postanesthesia in stable condition.  Plan: Follow-up on 02/22/2023 to discuss pathology results

## 2023-02-09 NOTE — H&P (Signed)
Urology Preoperative H&P   Chief Complaint: Rising PSA   History of Present Illness: Jacob Chapman is a 77 y.o. male with a history of grade 1 prostate cancer, s/p brachytherapy seed placement in 2013.   PSA at diagnosis: 6.73  Last PSA: 1.83 (11/2022)--PSA nadir was 0.2   His PSA continues to rise. Recent PSAM PET CT showed no signs of metastatic disease.  From a urinary standpoint, he reports a good FOS and feels like he is emptying his bladder well. He has occasional urgency/frequency, but is not bothered by it. Nocturia x 1. Denies interval UTIs, dysuria or hematuria.   Past Medical History:  Diagnosis Date   Bulging lumbar disc    no current prob   Diabetes mellitus, type 2 (HCC)    type II   History of gout stable -- last bout 3 yrs ago   no current prob   HTN (hypertension)    Hyperlipidemia    Prostate cancer (HCC) 09/01/2011   dx Adenocarcinoma    Past Surgical History:  Procedure Laterality Date   COLONOSCOPY  2005, 2017   CYSTOSCOPY  11/06/2011   Procedure: CYSTOSCOPY FLEXIBLE;  Surgeon: Garnett Farm, MD;  Location: [facility];  Service: Urology;  Laterality: N/A;  no seeds found in bladder   PROSTATE BIOPSY  09/01/2011   DR OTTELIN'S OFFICE   gleason 3+3=6,volume=21cc,PSA=6.73   RADIOACTIVE SEED IMPLANT  11/06/2011   Procedure: RADIOACTIVE SEED IMPLANT;  Surgeon: Garnett Farm, MD;  Location: [facility];  Service: Urology;  Laterality: Bilateral;  85 seeds implanted   VASECTOMY      Allergies:  Allergies  Allergen Reactions   Metformin Nausea Only    Family History  Adopted: Yes  Problem Relation Age of Onset   Stroke Mother        80   Cancer Father 75       lung ca   Heart disease Sister 39       rhematic fever   Hypertension Other 29       maternal 1st cousin,leukemia deceased   Colon cancer Neg Hx    Colon polyps Neg Hx    Esophageal cancer Neg Hx    Rectal cancer Neg Hx    Stomach cancer Neg Hx     Social  History:  reports that he has never smoked. He has never used smokeless tobacco. He reports that he does not drink alcohol and does not use drugs.  ROS: A complete review of systems was performed.  All systems are negative except for pertinent findings as noted.  Physical Exam:  Vital signs in last 24 hours: Temp:  [97.7 F (36.5 C)] 97.7 F (36.5 C) (10/22 0814) Pulse Rate:  [57] 57 (10/22 0814) Resp:  [16] 16 (10/22 0814) BP: (118)/(72) 118/72 (10/22 0814) SpO2:  [98 %] 98 % (10/22 0814) Weight:  [75.8 kg] 75.8 kg (10/22 0814) Constitutional:  Alert and oriented, No acute distress Cardiovascular: Regular rate and rhythm, No JVD Respiratory: Normal respiratory effort, Lungs clear bilaterally GI: Abdomen is soft, nontender, nondistended, no abdominal masses GU: No CVA tenderness Lymphatic: No lymphadenopathy Neurologic: Grossly intact, no focal deficits Psychiatric: Normal mood and affect  Laboratory Data:  Recent Labs    02/09/23 0808  HGB 14.3  HCT 42.0    Recent Labs    02/09/23 0808  NA 142  K 4.5  CL 106  GLUCOSE 85  BUN 25*  CREATININE 1.00  Results for orders placed or performed during the hospital encounter of 02/09/23 (from the past 24 hour(s))  I-STAT, chem 8     Status: Abnormal   Collection Time: 02/09/23  8:08 AM  Result Value Ref Range   Sodium 142 135 - 145 mmol/L   Potassium 4.5 3.5 - 5.1 mmol/L   Chloride 106 98 - 111 mmol/L   BUN 25 (H) 8 - 23 mg/dL   Creatinine, Ser 1.19 0.61 - 1.24 mg/dL   Glucose, Bld 85 70 - 99 mg/dL   Calcium, Ion 1.47 1.15 - 1.40 mmol/L   TCO2 26 22 - 32 mmol/L   Hemoglobin 14.3 13.0 - 17.0 g/dL   HCT 82.9 56.2 - 13.0 %   No results found for this or any previous visit (from the past 240 hour(s)).  Renal Function: Recent Labs    02/09/23 0808  CREATININE 1.00   Estimated Creatinine Clearance: 57.6 mL/min (by C-G formula based on SCr of 1 mg/dL).  Radiologic Imaging: No results found.  I independently  reviewed the above imaging studies.  Assessment and Plan Jacob Chapman is a 77 y.o. male with a rising PSA following Brachytherapy seed placement for treatment of grade 1 prostate cancer.    -Risks, benefits and alternatives of TRUS with prostate biopsy was discussed with the patient.  He voices understanding and wishes to proceed.    Rhoderick Moody, MD 02/09/2023, 9:37 AM  [facility] Pager: [phone]

## 2023-02-09 NOTE — Anesthesia Postprocedure Evaluation (Signed)
Anesthesia Post Note  Patient: Natale Milch  Procedure(s) Performed: BIOPSY TRANSRECTAL ULTRASONIC PROSTATE (TUBP)     Patient location during evaluation: PACU Anesthesia Type: MAC Level of consciousness: awake and alert Pain management: pain level controlled Vital Signs Assessment: post-procedure vital signs reviewed and stable Respiratory status: spontaneous breathing, nonlabored ventilation and respiratory function stable Cardiovascular status: stable and blood pressure returned to baseline Anesthetic complications: no   No notable events documented.  Last Vitals:  Vitals:   02/09/23 1045 02/09/23 1108  BP: 103/63 112/68  Pulse: (!) 56 (!) 55  Resp: 20 16  Temp:  (!) 36.3 C  SpO2: 96% 99%    Last Pain:  Vitals:   02/09/23 1108  TempSrc:   PainSc: 0-No pain                 Beryle Lathe

## 2023-02-11 ENCOUNTER — Encounter (HOSPITAL_BASED_OUTPATIENT_CLINIC_OR_DEPARTMENT_OTHER): Payer: Self-pay | Admitting: Urology

## 2023-02-11 LAB — SURGICAL PATHOLOGY

## 2023-02-18 ENCOUNTER — Other Ambulatory Visit: Payer: Self-pay | Admitting: Internal Medicine

## 2023-02-22 DIAGNOSIS — C61 Malignant neoplasm of prostate: Secondary | ICD-10-CM | POA: Diagnosis not present

## 2023-03-02 ENCOUNTER — Ambulatory Visit: Payer: Medicare HMO

## 2023-03-02 DIAGNOSIS — Z23 Encounter for immunization: Secondary | ICD-10-CM

## 2023-03-02 NOTE — Progress Notes (Signed)
PT visits today for their high dose flu vaccine. PT was informed of what they were receiving and tolerated the injection well. PT notified to reach back out to the office if needed.

## 2023-03-15 DIAGNOSIS — E119 Type 2 diabetes mellitus without complications: Secondary | ICD-10-CM | POA: Diagnosis not present

## 2023-03-15 DIAGNOSIS — H5213 Myopia, bilateral: Secondary | ICD-10-CM | POA: Diagnosis not present

## 2023-03-15 DIAGNOSIS — H2513 Age-related nuclear cataract, bilateral: Secondary | ICD-10-CM | POA: Diagnosis not present

## 2023-03-15 DIAGNOSIS — H25013 Cortical age-related cataract, bilateral: Secondary | ICD-10-CM | POA: Diagnosis not present

## 2023-03-15 LAB — HM DIABETES EYE EXAM

## 2023-03-16 ENCOUNTER — Encounter: Payer: Self-pay | Admitting: Internal Medicine

## 2023-03-24 ENCOUNTER — Other Ambulatory Visit: Payer: Self-pay | Admitting: Internal Medicine

## 2023-04-12 ENCOUNTER — Other Ambulatory Visit: Payer: Self-pay | Admitting: Internal Medicine

## 2023-04-19 ENCOUNTER — Other Ambulatory Visit (INDEPENDENT_AMBULATORY_CARE_PROVIDER_SITE_OTHER): Payer: Medicare HMO

## 2023-04-19 DIAGNOSIS — E785 Hyperlipidemia, unspecified: Secondary | ICD-10-CM | POA: Diagnosis not present

## 2023-04-19 DIAGNOSIS — I10 Essential (primary) hypertension: Secondary | ICD-10-CM

## 2023-04-19 LAB — CBC WITH DIFFERENTIAL/PLATELET
Basophils Absolute: 0 10*3/uL (ref 0.0–0.1)
Basophils Relative: 0.6 % (ref 0.0–3.0)
Eosinophils Absolute: 0.1 10*3/uL (ref 0.0–0.7)
Eosinophils Relative: 1.7 % (ref 0.0–5.0)
HCT: 40.6 % (ref 39.0–52.0)
Hemoglobin: 13.4 g/dL (ref 13.0–17.0)
Lymphocytes Relative: 22.5 % (ref 12.0–46.0)
Lymphs Abs: 1 10*3/uL (ref 0.7–4.0)
MCHC: 33.1 g/dL (ref 30.0–36.0)
MCV: 99.5 fL (ref 78.0–100.0)
Monocytes Absolute: 0.5 10*3/uL (ref 0.1–1.0)
Monocytes Relative: 12 % (ref 3.0–12.0)
Neutro Abs: 2.9 10*3/uL (ref 1.4–7.7)
Neutrophils Relative %: 63.2 % (ref 43.0–77.0)
Platelets: 182 10*3/uL (ref 150.0–400.0)
RBC: 4.08 Mil/uL — ABNORMAL LOW (ref 4.22–5.81)
RDW: 14.1 % (ref 11.5–15.5)
WBC: 4.5 10*3/uL (ref 4.0–10.5)

## 2023-04-19 LAB — COMPREHENSIVE METABOLIC PANEL
ALT: 13 U/L (ref 0–53)
AST: 17 U/L (ref 0–37)
Albumin: 3.7 g/dL (ref 3.5–5.2)
Alkaline Phosphatase: 63 U/L (ref 39–117)
BUN: 21 mg/dL (ref 6–23)
CO2: 25 meq/L (ref 19–32)
Calcium: 8.7 mg/dL (ref 8.4–10.5)
Chloride: 106 meq/L (ref 96–112)
Creatinine, Ser: 1.14 mg/dL (ref 0.40–1.50)
GFR: 63.03 mL/min (ref >60.00)
Glucose, Bld: 120 mg/dL — ABNORMAL HIGH (ref 70–99)
Potassium: 3.9 meq/L (ref 3.5–5.1)
Sodium: 141 meq/L (ref 135–145)
Total Bilirubin: 0.4 mg/dL (ref 0.2–1.2)
Total Protein: 6.3 g/dL (ref 6.0–8.3)

## 2023-04-19 LAB — LIPID PANEL
Cholesterol: 127 mg/dL (ref 0–200)
HDL: 39.2 mg/dL (ref >39.00)
LDL Cholesterol: 74 mg/dL (ref 0–99)
NonHDL: 87.9
Total CHOL/HDL Ratio: 3
Triglycerides: 68 mg/dL (ref 0.0–149.0)
VLDL: 13.6 mg/dL (ref 0.0–40.0)

## 2023-04-19 LAB — TSH: TSH: 2.13 u[IU]/mL (ref 0.35–5.50)

## 2023-04-19 LAB — HEMOGLOBIN A1C: Hgb A1c MFr Bld: 6.8 % — ABNORMAL HIGH (ref 4.6–6.5)

## 2023-04-27 ENCOUNTER — Encounter: Payer: Self-pay | Admitting: Internal Medicine

## 2023-04-27 ENCOUNTER — Ambulatory Visit (INDEPENDENT_AMBULATORY_CARE_PROVIDER_SITE_OTHER): Payer: Medicare HMO | Admitting: Internal Medicine

## 2023-04-27 VITALS — BP 110/70 | HR 62 | Temp 98.3°F | Ht 66.0 in | Wt 172.0 lb

## 2023-04-27 DIAGNOSIS — E119 Type 2 diabetes mellitus without complications: Secondary | ICD-10-CM

## 2023-04-27 DIAGNOSIS — E785 Hyperlipidemia, unspecified: Secondary | ICD-10-CM | POA: Diagnosis not present

## 2023-04-27 DIAGNOSIS — R609 Edema, unspecified: Secondary | ICD-10-CM

## 2023-04-27 DIAGNOSIS — I10 Essential (primary) hypertension: Secondary | ICD-10-CM

## 2023-04-27 DIAGNOSIS — S92001D Unspecified fracture of right calcaneus, subsequent encounter for fracture with routine healing: Secondary | ICD-10-CM | POA: Diagnosis not present

## 2023-04-27 MED ORDER — AMLODIPINE BESYLATE 5 MG PO TABS: 5.0000 mg | ORAL_TABLET | Freq: Every day | ORAL | 3 refills | Status: DC

## 2023-04-27 NOTE — Assessment & Plan Note (Signed)
Chronic  Cont on Lovastatin 20 mg a day

## 2023-04-27 NOTE — Assessment & Plan Note (Signed)
 Chronic Reduce Norvasc to 5 mg and cont Losartan 100 mg/d Nl BP at home

## 2023-04-27 NOTE — Assessment & Plan Note (Signed)
 Resolved

## 2023-04-27 NOTE — Progress Notes (Signed)
 Subjective:  Patient ID: Jacob Chapman, male    DOB: Sep 30, 1945  Age: 78 y.o. MRN: [id number]  CC: Medical Management of Chronic Issues (3 mnth f/u, Discuss fluid pill and compression socks.)   HPI Jacob Chapman presents for edema, h/o heel fx L F/u on DM, HTN  Outpatient Medications Prior to Visit  Medication Sig Dispense Refill   ACCU-CHEK GUIDE test strip TEST BLOOD SUGAR ONE TIME DAILY 100 strip 3   Accu-Chek Softclix Lancets lancets TEST BLOOD SUGAR EVERY DAY (ANNUAL APPOINTMENT DUE IN NOVEMBER MUST SEE PROVIDER FOR FUTURE REFILLS) 100 each 3   Alcohol Swabs (DROPSAFE ALCOHOL PREP) 70 % PADS USE AS DIRECTED TO WIPE FINGER TO CHECK BLOOD SUGARS EVERY DAY (APPT DUE IN [place]. SEE MD FOR FUTURE REFILLS) 100 each 3   ALPRAZolam  (XANAX ) 0.25 MG tablet Take 1 tablet (0.25 mg total) by mouth 2 (two) times daily as needed for anxiety. 60 tablet 2   aspirin 81 MG EC tablet Take 81 mg by mouth daily.     Blood Glucose Calibration (ACCU-CHEK AVIVA) SOLN USE AS DIRECTED WITH GLUCOSE METER 1 each 3   Blood Glucose Monitoring Suppl (ACCU-CHEK AVIVA PLUS) w/Device KIT TEST BLOOD SUGAR EVERY DAY 1 kit 0   Blood Glucose Monitoring Suppl (ACCU-CHEK GUIDE) w/Device KIT USE AS DIRECTED 1 kit 3   cholecalciferol (VITAMIN D) 1000 UNITS tablet daily.     Cyanocobalamin (VITAMIN B12 PO) daily.     escitalopram  (LEXAPRO ) 5 MG tablet TAKE 1 TABLET EVERY DAY AT BEDTIME 90 tablet 3   furosemide  (LASIX ) 20 MG tablet TAKE 1 TO 2 TABLETS EVERY DAY AS NEEDED FOR EDEMA 180 tablet 3   glimepiride  (AMARYL ) 2 MG tablet TAKE 2 TABLETS DAILY WITH BREAKFAST (MUST SEE PROVIDER AT ANNUAL NOVEMBER APPT FOR FUTURE REFILLS) 180 tablet 3   losartan  (COZAAR ) 100 MG tablet TAKE 1 TABLET EVERY DAY 90 tablet 3   lovastatin  (MEVACOR ) 20 MG tablet TAKE 1 TABLET AT BEDTIME (ANNUAL APPOINTMENT DUE IN NOVEMBER MUST SEE PROVIDER FOR FUTURE REFILLS) 90 tablet 3   pioglitazone  (ACTOS ) 45 MG tablet TAKE 1 TABLET EVERY DAY (ANNUAL  APPOINTMENT DUE IN NOVEMBER, MUST SEE PROVIDER FOR FUTURE REFILLS) 90 tablet 3   traMADol  (ULTRAM ) 50 MG tablet Take 50 mg by mouth every 6 (six) hours as needed.     vitamin B-12 (CYANOCOBALAMIN) 500 MCG tablet Take 500 mcg by mouth daily.     amLODipine  (NORVASC ) 5 MG tablet TAKE 1 AND 1/2 TABLETS EVERY DAY (ANNUAL APPOINTMENT DUE IN [place]. MUST SEE PROVIDER FOR FUTURE REFILLS) 135 tablet 3   No facility-administered medications prior to visit.    ROS: Review of Systems  Constitutional:  Negative for appetite change, fatigue and unexpected weight change.  HENT:  Negative for congestion, nosebleeds, sneezing, sore throat and trouble swallowing.   Eyes:  Negative for itching and visual disturbance.  Respiratory:  Negative for cough.   Cardiovascular:  Negative for chest pain, palpitations and leg swelling.  Gastrointestinal:  Negative for abdominal distention, blood in stool, diarrhea and nausea.  Genitourinary:  Negative for frequency and hematuria.  Musculoskeletal:  Negative for back pain, gait problem, joint swelling and neck pain.  Skin:  Negative for rash.  Neurological:  Negative for dizziness, tremors, speech difficulty and weakness.  Psychiatric/Behavioral:  Negative for agitation, dysphoric mood, sleep disturbance and suicidal ideas. The patient is not nervous/anxious.     Objective:  BP 110/70 (BP Location: Left Arm, Patient Position: Sitting,  Cuff Size: Normal)   Pulse 62   Temp 98.3 F (36.8 C) (Oral)   Ht 5' 6 (1.676 m)   Wt 172 lb (78 kg)   SpO2 96%   BMI 27.76 kg/m   BP Readings from Last 3 Encounters:  04/27/23 110/70  02/09/23 112/68  01/19/23 120/70    Wt Readings from Last 3 Encounters:  04/27/23 172 lb (78 kg)  02/09/23 167 lb (75.8 kg)  01/19/23 169 lb (76.7 kg)    Physical Exam Constitutional:      General: He is not in acute distress.    Appearance: He is well-developed.     Comments: NAD  Eyes:     Conjunctiva/sclera: Conjunctivae normal.      Pupils: Pupils are equal, round, and reactive to light.  Neck:     Thyroid : No thyromegaly.     Vascular: No JVD.  Cardiovascular:     Rate and Rhythm: Normal rate and regular rhythm.     Heart sounds: Normal heart sounds. No murmur heard.    No friction rub. No gallop.  Pulmonary:     Effort: Pulmonary effort is normal. No respiratory distress.     Breath sounds: Normal breath sounds. No wheezing or rales.  Chest:     Chest wall: No tenderness.  Abdominal:     General: Bowel sounds are normal. There is no distension.     Palpations: Abdomen is soft. There is no mass.     Tenderness: There is no abdominal tenderness. There is no guarding or rebound.  Musculoskeletal:        General: No tenderness. Normal range of motion.     Cervical back: Normal range of motion.  Lymphadenopathy:     Cervical: No cervical adenopathy.  Skin:    General: Skin is warm and dry.     Findings: No rash.  Neurological:     Mental Status: He is alert and oriented to person, place, and time.     Cranial Nerves: No cranial nerve deficit.     Motor: No abnormal muscle tone.     Coordination: Coordination normal.     Gait: Gait normal.     Deep Tendon Reflexes: Reflexes are normal and symmetric.  Psychiatric:        Behavior: Behavior normal.        Thought Content: Thought content normal.        Judgment: Judgment normal.   No edema B feet ok Lab Results  Component Value Date   WBC 4.5 04/19/2023   HGB 13.4 04/19/2023   HCT 40.6 04/19/2023   PLT 182.0 04/19/2023   GLUCOSE 120 (H) 04/19/2023   CHOL 127 04/19/2023   TRIG 68.0 04/19/2023   HDL 39.20 04/19/2023   LDLCALC 74 04/19/2023   ALT 13 04/19/2023   AST 17 04/19/2023   NA 141 04/19/2023   K 3.9 04/19/2023   CL 106 04/19/2023   CREATININE 1.14 04/19/2023   BUN 21 04/19/2023   CO2 25 04/19/2023   TSH 2.13 04/19/2023   PSA 0.3 01/30/2016   INR CANCELED 01/18/2014   HGBA1C 6.8 (H) 04/19/2023   MICROALBUR 1.4 02/25/2021    US   Guided Needle Placement Result Date: 02/09/2023 CLINICAL DATA:  Ultrasound was provided for use by the ordering physician.  No provider Interpretation or professional fees incurred.    US  PROSTATE BIOPSY MULTIPLE Result Date: 02/09/2023 Please see Notes tab for imaging impression.  US  Transrectal Complete Result Date: 02/09/2023 Please see Notes  tab for imaging impression.  US  Intraoperative Result Date: 02/09/2023 CLINICAL DATA:  Ultrasound was provided for use by the ordering physician.  No provider Interpretation or professional fees incurred.     Assessment & Plan:   Problem List Items Addressed This Visit     DM2 (diabetes mellitus, type 2) (HCC) - Primary   Relevant Orders   Comprehensive metabolic panel   Hemoglobin A1c   Dyslipidemia   Chronic  Cont on Lovastatin  20 mg a day      Relevant Orders   Comprehensive metabolic panel   Hemoglobin A1c   Essential hypertension   Chronic Reduce Norvasc  to 5 mg and cont Losartan  100 mg/d Nl BP at home      Relevant Medications   amLODipine  (NORVASC ) 5 MG tablet   Heel bone fracture   S/p R heel fx - 03/2022 - [facility] Residual edema - better Compression socks Blue-Emu cream -use 2-3 times a day      Edema   Resolved         Meds ordered this encounter  Medications   amLODipine  (NORVASC ) 5 MG tablet    Sig: Take 1 tablet (5 mg total) by mouth daily.    Dispense:  90 tablet    Refill:  3      Follow-up: Return in about 3 months (around 07/26/2023) for a follow-up visit.  Marolyn Noel, MD

## 2023-04-27 NOTE — Assessment & Plan Note (Signed)
 S/p R heel fx - 03/2022 - [facility] Residual edema - better Compression socks Blue-Emu cream -use 2-3 times a day

## 2023-07-19 ENCOUNTER — Other Ambulatory Visit (INDEPENDENT_AMBULATORY_CARE_PROVIDER_SITE_OTHER)

## 2023-07-19 DIAGNOSIS — E119 Type 2 diabetes mellitus without complications: Secondary | ICD-10-CM | POA: Diagnosis not present

## 2023-07-19 DIAGNOSIS — E785 Hyperlipidemia, unspecified: Secondary | ICD-10-CM

## 2023-07-19 LAB — COMPREHENSIVE METABOLIC PANEL WITH GFR
ALT: 15 U/L (ref 0–53)
AST: 20 U/L (ref 0–37)
Albumin: 3.9 g/dL (ref 3.5–5.2)
Alkaline Phosphatase: 68 U/L (ref 39–117)
BUN: 16 mg/dL (ref 6–23)
CO2: 25 meq/L (ref 19–32)
Calcium: 8.9 mg/dL (ref 8.4–10.5)
Chloride: 108 meq/L (ref 96–112)
Creatinine, Ser: 1.02 mg/dL (ref 0.40–1.50)
GFR: 71.91 mL/min (ref >60.00)
Glucose, Bld: 117 mg/dL — ABNORMAL HIGH (ref 70–99)
Potassium: 4 meq/L (ref 3.5–5.1)
Sodium: 142 meq/L (ref 135–145)
Total Bilirubin: 0.5 mg/dL (ref 0.2–1.2)
Total Protein: 6.5 g/dL (ref 6.0–8.3)

## 2023-07-19 LAB — HEMOGLOBIN A1C: Hgb A1c MFr Bld: 6.8 % — ABNORMAL HIGH (ref 4.6–6.5)

## 2023-07-27 ENCOUNTER — Encounter: Payer: Self-pay | Admitting: Internal Medicine

## 2023-07-27 ENCOUNTER — Ambulatory Visit (INDEPENDENT_AMBULATORY_CARE_PROVIDER_SITE_OTHER): Payer: Medicare HMO | Admitting: Internal Medicine

## 2023-07-27 VITALS — BP 106/70 | HR 58 | Temp 97.6°F | Ht 66.0 in | Wt 171.0 lb

## 2023-07-27 DIAGNOSIS — F4321 Adjustment disorder with depressed mood: Secondary | ICD-10-CM

## 2023-07-27 DIAGNOSIS — Z794 Long term (current) use of insulin: Secondary | ICD-10-CM

## 2023-07-27 DIAGNOSIS — E119 Type 2 diabetes mellitus without complications: Secondary | ICD-10-CM | POA: Diagnosis not present

## 2023-07-27 DIAGNOSIS — C61 Malignant neoplasm of prostate: Secondary | ICD-10-CM | POA: Diagnosis not present

## 2023-07-27 DIAGNOSIS — E785 Hyperlipidemia, unspecified: Secondary | ICD-10-CM

## 2023-07-27 DIAGNOSIS — I10 Essential (primary) hypertension: Secondary | ICD-10-CM

## 2023-07-27 NOTE — Progress Notes (Signed)
 Subjective:  Patient ID: Jacob Chapman, male    DOB: [birth year]  Age: 78 y.o. MRN: [id number]  CC: Medical Management of Chronic Issues (3 months)   HPI Jacob Chapman presents for DM, HTN, mood issues - doing well   Outpatient Medications Prior to Visit  Medication Sig Dispense Refill   ACCU-CHEK GUIDE test strip TEST BLOOD SUGAR ONE TIME DAILY 100 strip 3   Accu-Chek Softclix Lancets lancets TEST BLOOD SUGAR EVERY DAY (ANNUAL APPOINTMENT DUE IN NOVEMBER MUST SEE PROVIDER FOR FUTURE REFILLS) 100 each 3   Alcohol Swabs (DROPSAFE ALCOHOL PREP) 70 % PADS USE AS DIRECTED TO WIPE FINGER TO CHECK BLOOD SUGARS EVERY DAY (APPT DUE IN [place]. SEE MD FOR FUTURE REFILLS) 100 each 3   ALPRAZolam (XANAX) 0.25 MG tablet Take 1 tablet (0.25 mg total) by mouth 2 (two) times daily as needed for anxiety. 60 tablet 2   amLODipine (NORVASC) 5 MG tablet Take 1 tablet (5 mg total) by mouth daily. 90 tablet 3   aspirin 81 MG EC tablet Take 81 mg by mouth daily.     Blood Glucose Calibration (ACCU-CHEK AVIVA) SOLN USE AS DIRECTED WITH GLUCOSE METER 1 each 3   Blood Glucose Monitoring Suppl (ACCU-CHEK AVIVA PLUS) w/Device KIT TEST BLOOD SUGAR EVERY DAY 1 kit 0   Blood Glucose Monitoring Suppl (ACCU-CHEK GUIDE) w/Device KIT USE AS DIRECTED 1 kit 3   cholecalciferol (VITAMIN D) 1000 UNITS tablet daily.     Cyanocobalamin (VITAMIN B12 PO) daily.     escitalopram (LEXAPRO) 5 MG tablet TAKE 1 TABLET EVERY DAY AT BEDTIME 90 tablet 3   furosemide (LASIX) 20 MG tablet TAKE 1 TO 2 TABLETS EVERY DAY AS NEEDED FOR EDEMA 180 tablet 3   glimepiride (AMARYL) 2 MG tablet TAKE 2 TABLETS DAILY WITH BREAKFAST (MUST SEE PROVIDER AT ANNUAL NOVEMBER APPT FOR FUTURE REFILLS) 180 tablet 3   losartan (COZAAR) 100 MG tablet TAKE 1 TABLET EVERY DAY 90 tablet 3   lovastatin (MEVACOR) 20 MG tablet TAKE 1 TABLET AT BEDTIME (ANNUAL APPOINTMENT DUE IN NOVEMBER MUST SEE PROVIDER FOR FUTURE REFILLS) 90 tablet 3   pioglitazone (ACTOS) 45 MG  tablet TAKE 1 TABLET EVERY DAY (ANNUAL APPOINTMENT DUE IN NOVEMBER, MUST SEE PROVIDER FOR FUTURE REFILLS) 90 tablet 3   traMADol (ULTRAM) 50 MG tablet Take 50 mg by mouth every 6 (six) hours as needed.     vitamin B-12 (CYANOCOBALAMIN) 500 MCG tablet Take 500 mcg by mouth daily.     No facility-administered medications prior to visit.    ROS: Review of Systems  Constitutional:  Negative for appetite change, fatigue and unexpected weight change.  HENT:  Negative for congestion, nosebleeds, sneezing, sore throat and trouble swallowing.   Eyes:  Negative for itching and visual disturbance.  Respiratory:  Negative for cough.   Cardiovascular:  Negative for chest pain, palpitations and leg swelling.  Gastrointestinal:  Negative for abdominal distention, blood in stool, diarrhea and nausea.  Genitourinary:  Negative for frequency and hematuria.  Musculoskeletal:  Negative for back pain, gait problem, joint swelling and neck pain.  Skin:  Negative for rash.  Neurological:  Negative for dizziness, tremors, speech difficulty and weakness.  Psychiatric/Behavioral:  Negative for agitation, dysphoric mood, sleep disturbance and suicidal ideas. The patient is not nervous/anxious.     Objective:  BP 106/70 (BP Location: Left Arm, Patient Position: Sitting)   Pulse (!) 58   Temp 97.6 F (36.4 C) (Temporal)   Ht 5'  6" (1.676 m)   Wt 171 lb (77.6 kg)   SpO2 98%   BMI 27.60 kg/m   BP Readings from Last 3 Encounters:  07/27/23 106/70  04/27/23 110/70  02/09/23 112/68    Wt Readings from Last 3 Encounters:  07/27/23 171 lb (77.6 kg)  04/27/23 172 lb (78 kg)  02/09/23 167 lb (75.8 kg)    Physical Exam Constitutional:      General: He is not in acute distress.    Appearance: Normal appearance. He is well-developed.     Comments: NAD  Eyes:     Conjunctiva/sclera: Conjunctivae normal.     Pupils: Pupils are equal, round, and reactive to light.  Neck:     Thyroid: No thyromegaly.      Vascular: No JVD.  Cardiovascular:     Rate and Rhythm: Normal rate and regular rhythm.     Heart sounds: Normal heart sounds. No murmur heard.    No friction rub. No gallop.  Pulmonary:     Effort: Pulmonary effort is normal. No respiratory distress.     Breath sounds: Normal breath sounds. No wheezing or rales.  Chest:     Chest wall: No tenderness.  Abdominal:     General: Bowel sounds are normal. There is no distension.     Palpations: Abdomen is soft. There is no mass.     Tenderness: There is no abdominal tenderness. There is no guarding or rebound.  Musculoskeletal:        General: No tenderness. Normal range of motion.     Cervical back: Normal range of motion.  Lymphadenopathy:     Cervical: No cervical adenopathy.  Skin:    General: Skin is warm and dry.     Findings: No rash.  Neurological:     Mental Status: He is alert and oriented to person, place, and time.     Cranial Nerves: No cranial nerve deficit.     Motor: No abnormal muscle tone.     Coordination: Coordination normal.     Gait: Gait normal.     Deep Tendon Reflexes: Reflexes are normal and symmetric.  Psychiatric:        Behavior: Behavior normal.        Thought Content: Thought content normal.        Judgment: Judgment normal.     Lab Results  Component Value Date   WBC 4.5 04/19/2023   HGB 13.4 04/19/2023   HCT 40.6 04/19/2023   PLT 182.0 04/19/2023   GLUCOSE 117 (H) 07/19/2023   CHOL 127 04/19/2023   TRIG 68.0 04/19/2023   HDL 39.20 04/19/2023   LDLCALC 74 04/19/2023   ALT 15 07/19/2023   AST 20 07/19/2023   NA 142 07/19/2023   K 4.0 07/19/2023   CL 108 07/19/2023   CREATININE 1.02 07/19/2023   BUN 16 07/19/2023   CO2 25 07/19/2023   TSH 2.13 04/19/2023   PSA 0.3 01/30/2016   INR CANCELED 01/18/2014   HGBA1C 6.8 (H) 07/19/2023   MICROALBUR 1.4 02/25/2021    US Guided Needle Placement Result Date: 02/09/2023 CLINICAL DATA:  Ultrasound was provided for use by the ordering  physician.  No provider Interpretation or professional fees incurred.    [place] PROSTATE BIOPSY MULTIPLE Result Date: 02/09/2023 Please see Notes tab for imaging impression.  [place] Transrectal Complete Result Date: 02/09/2023 Please see Notes tab for imaging impression.  [place] Intraoperative Result Date: 02/09/2023 CLINICAL DATA:  Ultrasound was provided for use by the  ordering physician.  No provider Interpretation or professional fees incurred.     Assessment & Plan:   Problem List Items Addressed This Visit     DM2 (diabetes mellitus, type 2) (HCC) - Primary   Cont on Actos and Amaryl.       Relevant Orders   Comprehensive metabolic panel with GFR   Hemoglobin A1c   Dyslipidemia   Chronic  Cont on Lovastatin 20 mg a day      Essential hypertension   Chronic Reduce Norvasc to 5 mg and cont Losartan 100 mg/d Nl BP at home      Prostate cancer [facility])   F/u w/Dr Winter. PSA      Grief      No orders of the defined types were placed in this encounter.     Follow-up: Return in about 3 months (around 10/26/2023) for a follow-up visit.  Sonda Primes, MD

## 2023-07-27 NOTE — Assessment & Plan Note (Signed)
Cont on Actos and Amaryl.

## 2023-07-27 NOTE — Assessment & Plan Note (Signed)
 Chronic Reduce Norvasc to 5 mg and cont Losartan 100 mg/d Nl BP at home

## 2023-07-27 NOTE — Assessment & Plan Note (Signed)
Chronic  Cont on Lovastatin 20 mg a day

## 2023-07-27 NOTE — Assessment & Plan Note (Signed)
 F/u w/Dr Winter. PSA

## 2023-08-23 DIAGNOSIS — R9721 Rising PSA following treatment for malignant neoplasm of prostate: Secondary | ICD-10-CM | POA: Diagnosis not present

## 2023-08-24 LAB — LAB REPORT - SCANNED: PSA, FREE: 2.08

## 2023-09-22 ENCOUNTER — Other Ambulatory Visit: Payer: Self-pay | Admitting: Internal Medicine

## 2023-10-06 ENCOUNTER — Other Ambulatory Visit: Payer: Self-pay | Admitting: Internal Medicine

## 2023-10-19 ENCOUNTER — Other Ambulatory Visit (INDEPENDENT_AMBULATORY_CARE_PROVIDER_SITE_OTHER)

## 2023-10-19 DIAGNOSIS — E119 Type 2 diabetes mellitus without complications: Secondary | ICD-10-CM | POA: Diagnosis not present

## 2023-10-19 DIAGNOSIS — L814 Other melanin hyperpigmentation: Secondary | ICD-10-CM | POA: Diagnosis not present

## 2023-10-19 DIAGNOSIS — L821 Other seborrheic keratosis: Secondary | ICD-10-CM | POA: Diagnosis not present

## 2023-10-19 DIAGNOSIS — B001 Herpesviral vesicular dermatitis: Secondary | ICD-10-CM | POA: Diagnosis not present

## 2023-10-19 DIAGNOSIS — S30861A Insect bite (nonvenomous) of abdominal wall, initial encounter: Secondary | ICD-10-CM | POA: Diagnosis not present

## 2023-10-19 DIAGNOSIS — B351 Tinea unguium: Secondary | ICD-10-CM | POA: Diagnosis not present

## 2023-10-19 DIAGNOSIS — D225 Melanocytic nevi of trunk: Secondary | ICD-10-CM | POA: Diagnosis not present

## 2023-10-19 LAB — COMPREHENSIVE METABOLIC PANEL WITH GFR
ALT: 16 U/L (ref 0–53)
AST: 18 U/L (ref 0–37)
Albumin: 4 g/dL (ref 3.5–5.2)
Alkaline Phosphatase: 65 U/L (ref 39–117)
BUN: 13 mg/dL (ref 6–23)
CO2: 28 meq/L (ref 19–32)
Calcium: 8.9 mg/dL (ref 8.4–10.5)
Chloride: 106 meq/L (ref 96–112)
Creatinine, Ser: 1.04 mg/dL (ref 0.40–1.50)
GFR: 70.13 mL/min (ref >60.00)
Glucose, Bld: 125 mg/dL — ABNORMAL HIGH (ref 70–99)
Potassium: 3.8 meq/L (ref 3.5–5.1)
Sodium: 142 meq/L (ref 135–145)
Total Bilirubin: 0.5 mg/dL (ref 0.2–1.2)
Total Protein: 6.6 g/dL (ref 6.0–8.3)

## 2023-10-19 LAB — HEMOGLOBIN A1C: Hgb A1c MFr Bld: 6.9 % — ABNORMAL HIGH (ref 4.6–6.5)

## 2023-10-20 ENCOUNTER — Ambulatory Visit: Payer: Self-pay | Admitting: Internal Medicine

## 2023-10-26 ENCOUNTER — Ambulatory Visit (INDEPENDENT_AMBULATORY_CARE_PROVIDER_SITE_OTHER): Admitting: Internal Medicine

## 2023-10-26 ENCOUNTER — Encounter: Payer: Self-pay | Admitting: Internal Medicine

## 2023-10-26 ENCOUNTER — Ambulatory Visit

## 2023-10-26 VITALS — BP 118/74 | HR 97 | Temp 97.5°F | Ht 66.0 in | Wt 170.0 lb

## 2023-10-26 DIAGNOSIS — L820 Inflamed seborrheic keratosis: Secondary | ICD-10-CM | POA: Diagnosis not present

## 2023-10-26 DIAGNOSIS — C61 Malignant neoplasm of prostate: Secondary | ICD-10-CM

## 2023-10-26 DIAGNOSIS — I10 Essential (primary) hypertension: Secondary | ICD-10-CM | POA: Diagnosis not present

## 2023-10-26 DIAGNOSIS — E119 Type 2 diabetes mellitus without complications: Secondary | ICD-10-CM

## 2023-10-26 DIAGNOSIS — D492 Neoplasm of unspecified behavior of bone, soft tissue, and skin: Secondary | ICD-10-CM | POA: Diagnosis not present

## 2023-10-26 DIAGNOSIS — E785 Hyperlipidemia, unspecified: Secondary | ICD-10-CM

## 2023-10-26 NOTE — Assessment & Plan Note (Signed)
  F/u w/Dr Devere q 6 months. PSA q 6 months

## 2023-10-26 NOTE — Assessment & Plan Note (Signed)
Cont on Actos and Amaryl.

## 2023-10-26 NOTE — Progress Notes (Signed)
 Subjective:  Patient ID: Jacob Chapman, male    DOB: June 02, 1945  Age: 78 y.o. MRN: [id number]  CC: Medical Management of Chronic Issues (3 MNTH F/U, Pt as a growth on rt upper eye lid that he is wanting removed and looked at... Pt states it has increased in size as well.)   HPI Jacob Chapman presents for DM, HTN, dyslipidemia  Outpatient Medications Prior to Visit  Medication Sig Dispense Refill   ACCU-CHEK GUIDE TEST test strip TEST BLOOD SUGAR ONE TIME DAILY 100 strip 3   Accu-Chek Softclix Lancets lancets TEST BLOOD SUGAR EVERY DAY (ANNUAL APPOINTMENT DUE IN [place]OV[place] MUST SEE PROVIDER FOR FUTURE REFILLS) 100 each 3   Alcohol Swabs (DROPSAFE ALCOHOL PREP) 70 % PADS USE AS DIRECTED TO WIPE FINGER TO CHECK BLOOD SUGARS EVERY DAY (APPT DUE IN [place]. SEE MD FOR FUTURE REFILLS) 100 each 3   ALPRAZolam  (XANAX ) 0.25 MG tablet Take 1 tablet (0.25 mg total) by mouth 2 (two) times daily as needed for anxiety. 60 tablet 2   amLODipine  (NORVASC ) 5 MG tablet Take 1 tablet (5 mg total) by mouth daily. 90 tablet 3   aspirin 81 MG EC tablet Take 81 mg by mouth daily.     Blood Glucose Calibration (ACCU-CHEK AVIVA) SOLN USE AS DIRECTED WITH GLUCOSE METER 1 each 3   Blood Glucose Monitoring Suppl (ACCU-CHEK AVIVA PLUS) w/Device KIT TEST BLOOD SUGAR EVERY DAY 1 kit 0   Blood Glucose Monitoring Suppl (ACCU-CHEK GUIDE) w/Device KIT USE AS DIRECTED 1 kit 3   cholecalciferol (VITAMIN D) 1000 UNITS tablet daily.     Cyanocobalamin (VITAMIN B12 PO) daily.     escitalopram  (LEXAPRO ) 5 MG tablet TAKE 1 TABLET EVERY DAY AT BEDTIME 90 tablet 3   furosemide  (LASIX ) 20 MG tablet TAKE 1 TO 2 TABLETS EVERY DAY AS NEEDED FOR EDEMA 180 tablet 3   glimepiride  (AMARYL ) 2 MG tablet TAKE 2 TABLETS DAILY WITH BREAKFAST (MUST SEE PROVIDER AT ANNUAL NOVEMBER APPT FOR FUTURE REFILLS) 180 tablet 3   losartan  (COZAAR ) 100 MG tablet TAKE 1 TABLET EVERY DAY 90 tablet 3   lovastatin  (MEVACOR ) 20 MG tablet TAKE 1 TABLET AT  BEDTIME (ANNUAL APPOINTMENT DUE IN [place]OV[place] MUST SEE PROVIDER FOR FUTURE REFILLS) 90 tablet 3   pioglitazone  (ACTOS ) 45 MG tablet TAKE 1 TABLET EVERY DAY (ANNUAL APPOINTMENT DUE IN NOVEM[place], MUST SEE PROVIDER FOR FUTURE REFILLS) 90 tablet 3   traMADol  (ULTRAM ) 50 MG tablet Take 50 mg by mouth every 6 (six) hours as needed.     valACYclovir (VALTREX) 1000 MG tablet Take 1,000 mg by mouth 2 (two) times daily.     vitamin B-12 (CYANOCOBALAMIN) 500 MCG tablet Take 500 mcg by mouth daily.     No facility-administered medications prior to visit.    ROS: Review of Systems  Objective:  BP 118/74   Pulse 97   Temp (!) 97.5 F (36.4 C) (Oral)   Ht 5' 6 (1.676 m)   Wt 170 lb (77.1 kg)   SpO2 (!) 52%   BMI 27.44 kg/m   BP Readings from Last 3 Encounters:  10/26/23 118/74  07/27/23 106/70  04/27/23 110/70    Wt Readings from Last 3 Encounters:  10/26/23 170 lb (77.1 kg)  07/27/23 171 lb (77.6 kg)  04/27/23 172 lb (78 kg)    Physical Exam  Lab Results  Component Value Date   WBC 4.5 04/19/2023   HGB 13.4 04/19/2023   HCT 40.6 04/19/2023  PLT 182.0 04/19/2023   GLUCOSE 125 (H) 10/19/2023   CHOL 127 04/19/2023   TRIG 68.0 04/19/2023   HDL 39.20 04/19/2023   LDLCALC 74 04/19/2023   ALT 16 10/19/2023   AST 18 10/19/2023   NA 142 10/19/2023   K 3.8 10/19/2023   CL 106 10/19/2023   CREATININE 1.04 10/19/2023   BUN 13 10/19/2023   CO2 28 10/19/2023   TSH 2.13 04/19/2023   PSA 0.3 01/30/2016   INR CANCELED 01/18/2014   HGBA1C 6.9 (H) 10/19/2023    US  Guided Needle Placement Result Date: 02/09/2023 CLINICAL DATA:  Ultrasound was provided for use by the ordering physician.  No provider Interpretation or professional fees incurred.    US  PROSTATE BIOPSY MULTIPLE Result Date: 02/09/2023 Please see Notes tab for imaging impression.  US  Transrectal Complete Result Date: 02/09/2023 Please see Notes tab for imaging impression.  US  Intraoperative Result Date:  02/09/2023 CLINICAL DATA:  Ultrasound was provided for use by the ordering physician.  No provider Interpretation or professional fees incurred.     Assessment & Plan:   Problem List Items Addressed This Visit     DM2 (diabetes mellitus, type 2) (HCC) - Primary   Cont on Actos  and Amaryl .       Relevant Orders   Comprehensive metabolic panel with GFR   Hemoglobin A1c   TSH   Lipid panel   Dyslipidemia   Chronic  Cont on Lovastatin  20 mg a day      Relevant Orders   Comprehensive metabolic panel with GFR   Hemoglobin A1c   TSH   Lipid panel   Essential hypertension   Chronic Reduce Norvasc  to 5 mg and cont Losartan  100 mg/d Nl BP at home      Relevant Orders   Comprehensive metabolic panel with GFR   Hemoglobin A1c   TSH   Lipid panel   Prostate cancer (HCC)    F/u w/Dr Devere q 6 months. PSA q 6 months      Relevant Medications   valACYclovir (VALTREX) 1000 MG tablet      No orders of the defined types were placed in this encounter.     Follow-up: Return in about 3 months (around 01/26/2024) for a follow-up visit.  Marolyn Noel, MD

## 2023-10-26 NOTE — Assessment & Plan Note (Signed)
Chronic  Cont on Lovastatin 20 mg a day

## 2023-10-26 NOTE — Assessment & Plan Note (Signed)
 Chronic Reduce Norvasc to 5 mg and cont Losartan 100 mg/d Nl BP at home

## 2023-11-24 ENCOUNTER — Other Ambulatory Visit: Payer: Self-pay | Admitting: Internal Medicine

## 2023-12-04 ENCOUNTER — Other Ambulatory Visit: Payer: Self-pay | Admitting: Internal Medicine

## 2024-01-08 ENCOUNTER — Other Ambulatory Visit: Payer: Self-pay | Admitting: Internal Medicine

## 2024-01-17 ENCOUNTER — Other Ambulatory Visit (INDEPENDENT_AMBULATORY_CARE_PROVIDER_SITE_OTHER)

## 2024-01-17 ENCOUNTER — Other Ambulatory Visit

## 2024-01-17 DIAGNOSIS — E119 Type 2 diabetes mellitus without complications: Secondary | ICD-10-CM | POA: Diagnosis not present

## 2024-01-17 DIAGNOSIS — E785 Hyperlipidemia, unspecified: Secondary | ICD-10-CM

## 2024-01-17 DIAGNOSIS — I10 Essential (primary) hypertension: Secondary | ICD-10-CM

## 2024-01-17 LAB — COMPREHENSIVE METABOLIC PANEL WITH GFR
ALT: 17 U/L (ref 0–53)
AST: 19 U/L (ref 0–37)
Albumin: 4 g/dL (ref 3.5–5.2)
Alkaline Phosphatase: 61 U/L (ref 39–117)
BUN: 15 mg/dL (ref 6–23)
CO2: 26 meq/L (ref 19–32)
Calcium: 9.2 mg/dL (ref 8.4–10.5)
Chloride: 108 meq/L (ref 96–112)
Creatinine, Ser: 0.96 mg/dL (ref 0.40–1.50)
GFR: 77.06 mL/min (ref >60.00)
Glucose, Bld: 144 mg/dL — ABNORMAL HIGH (ref 70–99)
Potassium: 4.3 meq/L (ref 3.5–5.1)
Sodium: 141 meq/L (ref 135–145)
Total Bilirubin: 0.5 mg/dL (ref 0.2–1.2)
Total Protein: 6.5 g/dL (ref 6.0–8.3)

## 2024-01-17 LAB — LIPID PANEL
Cholesterol: 137 mg/dL (ref 0–200)
HDL: 43.8 mg/dL (ref >39.00)
LDL Cholesterol: 80 mg/dL (ref 0–99)
NonHDL: 92.71
Total CHOL/HDL Ratio: 3
Triglycerides: 63 mg/dL (ref 0.0–149.0)
VLDL: 12.6 mg/dL (ref 0.0–40.0)

## 2024-01-17 LAB — TSH: TSH: 1.98 u[IU]/mL (ref 0.35–5.50)

## 2024-01-17 LAB — HEMOGLOBIN A1C: Hgb A1c MFr Bld: 7.3 % — ABNORMAL HIGH (ref 4.6–6.5)

## 2024-01-19 ENCOUNTER — Ambulatory Visit: Payer: Self-pay | Admitting: Internal Medicine

## 2024-01-25 ENCOUNTER — Ambulatory Visit: Admitting: Internal Medicine

## 2024-01-25 ENCOUNTER — Encounter: Payer: Self-pay | Admitting: Internal Medicine

## 2024-01-25 VITALS — BP 108/66 | HR 54 | Temp 98.6°F | Ht 66.0 in | Wt 169.0 lb

## 2024-01-25 DIAGNOSIS — M1000 Idiopathic gout, unspecified site: Secondary | ICD-10-CM

## 2024-01-25 DIAGNOSIS — E119 Type 2 diabetes mellitus without complications: Secondary | ICD-10-CM | POA: Diagnosis not present

## 2024-01-25 DIAGNOSIS — E785 Hyperlipidemia, unspecified: Secondary | ICD-10-CM | POA: Diagnosis not present

## 2024-01-25 DIAGNOSIS — I10 Essential (primary) hypertension: Secondary | ICD-10-CM

## 2024-01-25 DIAGNOSIS — C61 Malignant neoplasm of prostate: Secondary | ICD-10-CM

## 2024-01-25 NOTE — Assessment & Plan Note (Signed)
 Chronic Reduce Norvasc to 5 mg and cont Losartan 100 mg/d Nl BP at home

## 2024-01-25 NOTE — Assessment & Plan Note (Signed)
  F/u w/Dr Devere q 6 months. PSA q 6 months

## 2024-01-25 NOTE — Assessment & Plan Note (Signed)
No relapse 

## 2024-01-25 NOTE — Assessment & Plan Note (Signed)
Worse. Improve diet.

## 2024-01-25 NOTE — Progress Notes (Signed)
 Subjective:  Patient ID: Jacob Chapman, male    DOB: Jun 26, 1945  Age: 78 y.o. MRN: [id number]  CC: Follow-up (Patient states nothing to discuss. )   HPI Jacob Chapman presents for DM, HTN, dyslipidemia  Outpatient Medications Prior to Visit  Medication Sig Dispense Refill   ACCU-CHEK GUIDE TEST test strip TEST BLOOD SUGAR ONE TIME DAILY 100 strip 3   Accu-Chek Softclix Lancets lancets TEST BLOOD SUGAR EVERY DAY (ANNUAL APPOINTMENT DUE IN NOVEMBER MUST SEE PROVIDER FOR FUTURE REFILLS) 100 each 3   Alcohol Swabs (DROPSAFE ALCOHOL PREP) 70 % PADS USE AS DIRECTED TO WIPE FINGER TO CHECK BLOOD SUGARS EVERY DAY 100 each 3   ALPRAZolam  (XANAX ) 0.25 MG tablet Take 1 tablet (0.25 mg total) by mouth 2 (two) times daily as needed for anxiety. 60 tablet 2   amLODipine  (NORVASC ) 5 MG tablet Take 1 tablet (5 mg total) by mouth daily. 90 tablet 3   aspirin 81 MG EC tablet Take 81 mg by mouth daily.     Blood Glucose Calibration (ACCU-CHEK AVIVA) SOLN USE AS DIRECTED WITH GLUCOSE METER 1 each 3   Blood Glucose Monitoring Suppl (ACCU-CHEK AVIVA PLUS) w/Device KIT TEST BLOOD SUGAR EVERY DAY 1 kit 0   Blood Glucose Monitoring Suppl (ACCU-CHEK GUIDE) w/Device KIT USE AS DIRECTED 1 kit 3   cholecalciferol (VITAMIN D) 1000 UNITS tablet daily.     Cyanocobalamin (VITAMIN B12 PO) daily.     escitalopram  (LEXAPRO ) 5 MG tablet TAKE 1 TABLET EVERY DAY AT BEDTIME 90 tablet 3   furosemide  (LASIX ) 20 MG tablet TAKE 1 TO 2 TABLETS EVERY DAY AS NEEDED FOR EDEMA 180 tablet 3   glimepiride  (AMARYL ) 2 MG tablet TAKE 2 TABLETS DAILY WITH BREAKFAST (MUST SEE PROVIDER AT ANNUAL NOVEMBER APPT FOR FUTURE REFILLS) 180 tablet 3   losartan  (COZAAR ) 100 MG tablet TAKE 1 TABLET EVERY DAY 90 tablet 3   lovastatin  (MEVACOR ) 20 MG tablet TAKE 1 TABLET AT BEDTIME (ANNUAL APPOINTMENT DUE IN NOVEMBER MUST SEE PROVIDER FOR FUTURE REFILLS) 90 tablet 3   pioglitazone  (ACTOS ) 45 MG tablet TAKE 1 TABLET EVERY DAY (ANNUAL APPOINTMENT DUE  IN NOVEMBER, MUST SEE PROVIDER FOR FUTURE REFILLS) 90 tablet 3   traMADol  (ULTRAM ) 50 MG tablet Take 50 mg by mouth every 6 (six) hours as needed.     valACYclovir (VALTREX) 1000 MG tablet Take 1,000 mg by mouth 2 (two) times daily.     vitamin B-12 (CYANOCOBALAMIN) 500 MCG tablet Take 500 mcg by mouth daily.     No facility-administered medications prior to visit.    ROS: Review of Systems  Constitutional:  Negative for appetite change, fatigue and unexpected weight change.  HENT:  Negative for congestion, nosebleeds, sneezing, sore throat and trouble swallowing.   Eyes:  Negative for itching and visual disturbance.  Respiratory:  Negative for cough.   Cardiovascular:  Negative for chest pain, palpitations and leg swelling.  Gastrointestinal:  Negative for abdominal distention, blood in stool, diarrhea and nausea.  Genitourinary:  Negative for frequency and hematuria.  Musculoskeletal:  Negative for back pain, gait problem, joint swelling and neck pain.  Skin:  Negative for rash.  Neurological:  Negative for dizziness, tremors, speech difficulty and weakness.  Psychiatric/Behavioral:  Negative for agitation, dysphoric mood, sleep disturbance and suicidal ideas. The patient is not nervous/anxious.     Objective:  BP 108/66   Pulse (!) 54   Temp 98.6 F (37 C) (Oral)   Ht 5' 6 (1.676  m)   Wt 169 lb (76.7 kg)   SpO2 98%   BMI 27.28 kg/m   BP Readings from Last 3 Encounters:  01/25/24 108/66  10/26/23 118/74  07/27/23 106/70    Wt Readings from Last 3 Encounters:  01/25/24 169 lb (76.7 kg)  10/26/23 170 lb (77.1 kg)  07/27/23 171 lb (77.6 kg)    Physical Exam Constitutional:      General: He is not in acute distress.    Appearance: He is well-developed. He is obese.     Comments: NAD  Eyes:     Conjunctiva/sclera: Conjunctivae normal.     Pupils: Pupils are equal, round, and reactive to light.  Neck:     Thyroid : No thyromegaly.     Vascular: No JVD.   Cardiovascular:     Rate and Rhythm: Normal rate and regular rhythm.     Heart sounds: Normal heart sounds. No murmur heard.    No friction rub. No gallop.  Pulmonary:     Effort: Pulmonary effort is normal. No respiratory distress.     Breath sounds: Normal breath sounds. No wheezing or rales.  Chest:     Chest wall: No tenderness.  Abdominal:     General: Bowel sounds are normal. There is no distension.     Palpations: Abdomen is soft. There is no mass.     Tenderness: There is no abdominal tenderness. There is no guarding or rebound.  Musculoskeletal:        General: No tenderness. Normal range of motion.     Cervical back: Normal range of motion.  Lymphadenopathy:     Cervical: No cervical adenopathy.  Skin:    General: Skin is warm and dry.     Findings: No rash.  Neurological:     Mental Status: He is alert and oriented to person, place, and time.     Cranial Nerves: No cranial nerve deficit.     Motor: No abnormal muscle tone.     Coordination: Coordination normal.     Gait: Gait normal.     Deep Tendon Reflexes: Reflexes are normal and symmetric.  Psychiatric:        Behavior: Behavior normal.        Thought Content: Thought content normal.        Judgment: Judgment normal.     Lab Results  Component Value Date   WBC 4.5 04/19/2023   HGB 13.4 04/19/2023   HCT 40.6 04/19/2023   PLT 182.0 04/19/2023   GLUCOSE 144 (H) 01/17/2024   CHOL 137 01/17/2024   TRIG 63.0 01/17/2024   HDL 43.80 01/17/2024   LDLCALC 80 01/17/2024   ALT 17 01/17/2024   AST 19 01/17/2024   NA 141 01/17/2024   K 4.3 01/17/2024   CL 108 01/17/2024   CREATININE 0.96 01/17/2024   BUN 15 01/17/2024   CO2 26 01/17/2024   TSH 1.98 01/17/2024   PSA 0.3 01/30/2016   INR CANCELED 01/18/2014   HGBA1C 7.3 (H) 01/17/2024    US  Guided Needle Placement Result Date: 02/09/2023 CLINICAL DATA:  Ultrasound was provided for use by the ordering physician.  No provider Interpretation or professional  fees incurred.    US  PROSTATE BIOPSY MULTIPLE Result Date: 02/09/2023 Please see Notes tab for imaging impression.  US  Transrectal Complete Result Date: 02/09/2023 Please see Notes tab for imaging impression.  US  Intraoperative Result Date: 02/09/2023 CLINICAL DATA:  Ultrasound was provided for use by the ordering physician.  No provider Interpretation  or professional fees incurred.     Assessment & Plan:   Problem List Items Addressed This Visit     DM2 (diabetes mellitus, type 2) (HCC) - Primary   Worse Improve diet      Relevant Orders   Comprehensive metabolic panel with GFR   Hemoglobin A1c   Dyslipidemia   Chronic  Cont on Lovastatin  20 mg a day      Essential hypertension   Chronic Reduce Norvasc  to 5 mg and cont Losartan  100 mg/d Nl BP at home      Gout   No relapse      Prostate cancer (HCC)    F/u w/Dr Devere q 6 months. PSA q 6 months         No orders of the defined types were placed in this encounter.     Follow-up: Return in about 3 months (around 04/26/2024) for Wellness Exam.  Marolyn Noel, MD

## 2024-01-25 NOTE — Assessment & Plan Note (Signed)
Chronic  Cont on Lovastatin 20 mg a day

## 2024-02-14 DIAGNOSIS — R9721 Rising PSA following treatment for malignant neoplasm of prostate: Secondary | ICD-10-CM | POA: Diagnosis not present

## 2024-02-21 DIAGNOSIS — C61 Malignant neoplasm of prostate: Secondary | ICD-10-CM | POA: Diagnosis not present

## 2024-02-21 DIAGNOSIS — R399 Unspecified symptoms and signs involving the genitourinary system: Secondary | ICD-10-CM | POA: Diagnosis not present

## 2024-02-28 ENCOUNTER — Ambulatory Visit: Payer: Medicare HMO

## 2024-02-28 DIAGNOSIS — Z23 Encounter for immunization: Secondary | ICD-10-CM | POA: Diagnosis not present

## 2024-02-28 NOTE — Progress Notes (Cosign Needed Addendum)
 Pt was given HD Flu vaccine w/o any complications.  Medical screening examination/treatment/procedure(s) were performed by non-physician practitioner and as supervising physician I was immediately available for consultation/collaboration.  I agree with above. Karlynn Noel, MD

## 2024-03-24 ENCOUNTER — Other Ambulatory Visit: Payer: Self-pay | Admitting: Internal Medicine

## 2024-03-27 DIAGNOSIS — H2513 Age-related nuclear cataract, bilateral: Secondary | ICD-10-CM | POA: Diagnosis not present

## 2024-03-27 DIAGNOSIS — E119 Type 2 diabetes mellitus without complications: Secondary | ICD-10-CM | POA: Diagnosis not present

## 2024-03-27 DIAGNOSIS — H25013 Cortical age-related cataract, bilateral: Secondary | ICD-10-CM | POA: Diagnosis not present

## 2024-03-27 DIAGNOSIS — H5213 Myopia, bilateral: Secondary | ICD-10-CM | POA: Diagnosis not present

## 2024-03-27 LAB — OPHTHALMOLOGY REPORT-SCANNED

## 2024-04-17 ENCOUNTER — Other Ambulatory Visit (INDEPENDENT_AMBULATORY_CARE_PROVIDER_SITE_OTHER)

## 2024-04-17 DIAGNOSIS — E119 Type 2 diabetes mellitus without complications: Secondary | ICD-10-CM | POA: Diagnosis not present

## 2024-04-17 LAB — COMPREHENSIVE METABOLIC PANEL WITH GFR
ALT: 20 U/L (ref 3–53)
AST: 22 U/L (ref 5–37)
Albumin: 3.9 g/dL (ref 3.5–5.2)
Alkaline Phosphatase: 60 U/L (ref 39–117)
BUN: 14 mg/dL (ref 6–23)
CO2: 25 meq/L (ref 19–32)
Calcium: 9 mg/dL (ref 8.4–10.5)
Chloride: 108 meq/L (ref 96–112)
Creatinine, Ser: 0.96 mg/dL (ref 0.40–1.50)
GFR: 76.93 mL/min
Glucose, Bld: 140 mg/dL — ABNORMAL HIGH (ref 70–99)
Potassium: 4.1 meq/L (ref 3.5–5.1)
Sodium: 142 meq/L (ref 135–145)
Total Bilirubin: 0.5 mg/dL (ref 0.2–1.2)
Total Protein: 6.4 g/dL (ref 6.0–8.3)

## 2024-04-17 LAB — HEMOGLOBIN A1C: Hgb A1c MFr Bld: 7.2 % — ABNORMAL HIGH (ref 4.6–6.5)

## 2024-04-25 ENCOUNTER — Other Ambulatory Visit: Payer: Self-pay | Admitting: Internal Medicine

## 2024-04-25 ENCOUNTER — Encounter: Payer: Self-pay | Admitting: Internal Medicine

## 2024-04-25 ENCOUNTER — Ambulatory Visit: Admitting: Internal Medicine

## 2024-04-25 ENCOUNTER — Ambulatory Visit

## 2024-04-25 VITALS — BP 136/68 | HR 59 | Temp 97.9°F | Ht 66.0 in | Wt 170.4 lb

## 2024-04-25 DIAGNOSIS — I10 Essential (primary) hypertension: Secondary | ICD-10-CM

## 2024-04-25 DIAGNOSIS — C61 Malignant neoplasm of prostate: Secondary | ICD-10-CM

## 2024-04-25 DIAGNOSIS — Z8546 Personal history of malignant neoplasm of prostate: Secondary | ICD-10-CM | POA: Diagnosis not present

## 2024-04-25 DIAGNOSIS — E785 Hyperlipidemia, unspecified: Secondary | ICD-10-CM | POA: Diagnosis not present

## 2024-04-25 DIAGNOSIS — E119 Type 2 diabetes mellitus without complications: Secondary | ICD-10-CM | POA: Diagnosis not present

## 2024-04-25 DIAGNOSIS — M545 Low back pain, unspecified: Secondary | ICD-10-CM

## 2024-04-25 NOTE — Assessment & Plan Note (Signed)
  F/u w/Dr Devere q 6 months. PSA q 6 months

## 2024-04-25 NOTE — Assessment & Plan Note (Signed)
Chronic  Cont on Lovastatin 20 mg a day

## 2024-04-25 NOTE — Progress Notes (Addendum)
 "  Subjective:  Patient ID: Jacob Chapman, male    DOB: 09/30/1945  Age: 79 y.o. MRN: [id number]  CC: Medical Management of Chronic Issues (3 Month follow up)   HPI Jacob Chapman presents for DM, HTN, mood problem History of prostate cancer  Outpatient Medications Prior to Visit  Medication Sig Dispense Refill   ACCU-CHEK GUIDE TEST test strip TEST BLOOD SUGAR ONE TIME DAILY 100 strip 3   Accu-Chek Softclix Lancets lancets TEST BLOOD SUGAR EVERY DAY (ANNUAL APPOINTMENT DUE IN NOVEMBER MUST SEE PROVIDER FOR FUTURE REFILLS) 100 each 3   Alcohol Swabs (DROPSAFE ALCOHOL PREP) 70 % PADS USE AS DIRECTED TO WIPE FINGER TO CHECK BLOOD SUGARS EVERY DAY 100 each 3   ALPRAZolam  (XANAX ) 0.25 MG tablet Take 1 tablet (0.25 mg total) by mouth 2 (two) times daily as needed for anxiety. 60 tablet 2   amLODipine  (NORVASC ) 5 MG tablet TAKE 1 TABLET EVERY DAY 90 tablet 3   aspirin 81 MG EC tablet Take 81 mg by mouth daily.     Blood Glucose Calibration (ACCU-CHEK AVIVA) SOLN USE AS DIRECTED WITH GLUCOSE METER 1 each 3   Blood Glucose Monitoring Suppl (ACCU-CHEK AVIVA PLUS) w/Device KIT TEST BLOOD SUGAR EVERY DAY 1 kit 0   Blood Glucose Monitoring Suppl (ACCU-CHEK GUIDE) w/Device KIT USE AS DIRECTED 1 kit 3   cholecalciferol (VITAMIN D) 1000 UNITS tablet daily.     Cyanocobalamin (VITAMIN B12 PO) daily.     furosemide  (LASIX ) 20 MG tablet TAKE 1 TO 2 TABLETS EVERY DAY AS NEEDED FOR EDEMA 180 tablet 3   glimepiride  (AMARYL ) 2 MG tablet TAKE 2 TABLETS DAILY WITH BREAKFAST (MUST SEE PROVIDER AT ANNUAL NOVEMBER APPT FOR FUTURE REFILLS) 180 tablet 3   losartan  (COZAAR ) 100 MG tablet TAKE 1 TABLET EVERY DAY 90 tablet 3   lovastatin  (MEVACOR ) 20 MG tablet TAKE 1 TABLET AT BEDTIME (ANNUAL APPOINTMENT DUE IN NOVEMBER MUST SEE PROVIDER FOR FUTURE REFILLS) 90 tablet 3   pioglitazone  (ACTOS ) 45 MG tablet TAKE 1 TABLET EVERY DAY (ANNUAL APPOINTMENT DUE IN NOVEMBER, MUST SEE PROVIDER FOR FUTURE REFILLS) 90 tablet 3    traMADol  (ULTRAM ) 50 MG tablet Take 50 mg by mouth every 6 (six) hours as needed.     valACYclovir (VALTREX) 1000 MG tablet Take 1,000 mg by mouth 2 (two) times daily.     vitamin B-12 (CYANOCOBALAMIN) 500 MCG tablet Take 500 mcg by mouth daily.     escitalopram  (LEXAPRO ) 5 MG tablet TAKE 1 TABLET EVERY DAY AT BEDTIME 90 tablet 3   No facility-administered medications prior to visit.    ROS: Review of Systems  Constitutional:  Negative for appetite change, fatigue and unexpected weight change.  HENT:  Negative for congestion, nosebleeds, sneezing, sore throat and trouble swallowing.   Eyes:  Negative for itching and visual disturbance.  Respiratory:  Negative for cough.   Cardiovascular:  Negative for chest pain, palpitations and leg swelling.  Gastrointestinal:  Negative for abdominal distention, blood in stool, diarrhea and nausea.  Genitourinary:  Negative for frequency and hematuria.  Musculoskeletal:  Negative for back pain, gait problem, joint swelling and neck pain.  Skin:  Negative for rash.  Neurological:  Negative for dizziness, tremors, speech difficulty and weakness.  Psychiatric/Behavioral:  Negative for agitation, dysphoric mood and sleep disturbance. The patient is not nervous/anxious.     Objective:  BP 136/68   Pulse (!) 59   Temp 97.9 F (36.6 C)   Ht 5' 6 (  1.676 m)   Wt 170 lb 6.4 oz (77.3 kg)   SpO2 99%   BMI 27.50 kg/m   BP Readings from Last 3 Encounters:  04/25/24 136/68  01/25/24 108/66  10/26/23 118/74    Wt Readings from Last 3 Encounters:  04/25/24 170 lb 6.4 oz (77.3 kg)  01/25/24 169 lb (76.7 kg)  10/26/23 170 lb (77.1 kg)    Physical Exam Constitutional:      General: He is not in acute distress.    Appearance: Normal appearance. He is well-developed.     Comments: NAD  Eyes:     Conjunctiva/sclera: Conjunctivae normal.     Pupils: Pupils are equal, round, and reactive to light.  Neck:     Thyroid : No thyromegaly.     Vascular: No  JVD.  Cardiovascular:     Rate and Rhythm: Normal rate and regular rhythm.     Heart sounds: Normal heart sounds. No murmur heard.    No friction rub. No gallop.  Pulmonary:     Effort: Pulmonary effort is normal. No respiratory distress.     Breath sounds: Normal breath sounds. No wheezing or rales.  Chest:     Chest wall: No tenderness.  Abdominal:     General: Bowel sounds are normal. There is no distension.     Palpations: Abdomen is soft. There is no mass.     Tenderness: There is no abdominal tenderness. There is no guarding or rebound.  Musculoskeletal:        General: No tenderness. Normal range of motion.     Cervical back: Normal range of motion.  Lymphadenopathy:     Cervical: No cervical adenopathy.  Skin:    General: Skin is warm and dry.     Findings: No rash.  Neurological:     Mental Status: He is alert and oriented to person, place, and time.     Cranial Nerves: No cranial nerve deficit.     Motor: No abnormal muscle tone.     Coordination: Coordination normal.     Gait: Gait normal.     Deep Tendon Reflexes: Reflexes are normal and symmetric.  Psychiatric:        Behavior: Behavior normal.        Thought Content: Thought content normal.        Judgment: Judgment normal.     Lab Results  Component Value Date   WBC 4.5 04/19/2023   HGB 13.4 04/19/2023   HCT 40.6 04/19/2023   PLT 182.0 04/19/2023   GLUCOSE 140 (H) 04/17/2024   CHOL 137 01/17/2024   TRIG 63.0 01/17/2024   HDL 43.80 01/17/2024   LDLCALC 80 01/17/2024   ALT 20 04/17/2024   AST 22 04/17/2024   NA 142 04/17/2024   K 4.1 04/17/2024   CL 108 04/17/2024   CREATININE 0.96 04/17/2024   BUN 14 04/17/2024   CO2 25 04/17/2024   TSH 1.98 01/17/2024   PSA 0.3 01/30/2016   INR CANCELED 01/18/2014   HGBA1C 7.2 (H) 04/17/2024    US  Guided Needle Placement Result Date: 02/09/2023 CLINICAL DATA:  Ultrasound was provided for use by the ordering physician.  No provider Interpretation or  professional fees incurred.    US  PROSTATE BIOPSY MULTIPLE Result Date: 02/09/2023 Please see Notes tab for imaging impression.  US  Transrectal Complete Result Date: 02/09/2023 Please see Notes tab for imaging impression.  US  Intraoperative Result Date: 02/09/2023 CLINICAL DATA:  Ultrasound was provided for use by the ordering physician.  No provider Interpretation or professional fees incurred.     Assessment & Plan:   Problem List Items Addressed This Visit     DM2 (diabetes mellitus, type 2) (HCC) - Primary   Better Improve diet      Relevant Orders   Hemoglobin A1c   Comprehensive metabolic panel with GFR   Dyslipidemia   Chronic  Cont on Lovastatin  20 mg a day      Essential hypertension   Chronic Reduce Norvasc  to 5 mg and cont Losartan  100 mg/d Nl BP at home      LOW BACK PAIN   Ibuprofen  prn pc      History of prostate cancer   F/u w/Dr Winter q 6 months. PSA q 6 months         No orders of the defined types were placed in this encounter.     Follow-up: Return in about 3 months (around 07/24/2024) for a follow-up visit.  Marolyn Noel, MD "

## 2024-04-25 NOTE — Assessment & Plan Note (Signed)
Ibuprofen prn pc °

## 2024-04-25 NOTE — Assessment & Plan Note (Signed)
 Chronic Reduce Norvasc to 5 mg and cont Losartan 100 mg/d Nl BP at home

## 2024-04-25 NOTE — Assessment & Plan Note (Signed)
 Better Improve diet

## 2024-05-02 ENCOUNTER — Encounter: Payer: Self-pay | Admitting: Internal Medicine

## 2024-07-25 ENCOUNTER — Ambulatory Visit: Admitting: Internal Medicine

## 2025-02-27 ENCOUNTER — Ambulatory Visit
